# Patient Record
Sex: Male | Born: 1985 | Race: Black or African American | Hispanic: No | Marital: Single | Smoking: Never smoker
Health system: Southern US, Community
[De-identification: ages and names within clinical notes are randomized; demographics above are authoritative.]

## PROBLEM LIST (undated history)

## (undated) DIAGNOSIS — I011 Acute rheumatic endocarditis: Secondary | ICD-10-CM

## (undated) DIAGNOSIS — M069 Rheumatoid arthritis, unspecified: Secondary | ICD-10-CM

## (undated) DIAGNOSIS — G589 Mononeuropathy, unspecified: Secondary | ICD-10-CM

## (undated) DIAGNOSIS — K4090 Unilateral inguinal hernia, without obstruction or gangrene, not specified as recurrent: Secondary | ICD-10-CM

## (undated) DIAGNOSIS — M719 Bursopathy, unspecified: Secondary | ICD-10-CM

## (undated) DIAGNOSIS — S60419A Abrasion of unspecified finger, initial encounter: Secondary | ICD-10-CM

## (undated) DIAGNOSIS — K219 Gastro-esophageal reflux disease without esophagitis: Secondary | ICD-10-CM

## (undated) DIAGNOSIS — S62609A Fracture of unspecified phalanx of unspecified finger, initial encounter for closed fracture: Secondary | ICD-10-CM

## (undated) HISTORY — DX: Mononeuropathy, unspecified: G58.9

## (undated) HISTORY — PX: FINGER SURGERY: SHX640

## (undated) HISTORY — DX: Bursopathy, unspecified: M71.9

## (undated) HISTORY — DX: Acute rheumatic endocarditis: I01.1

## (undated) HISTORY — DX: Gastro-esophageal reflux disease without esophagitis: K21.9

---
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
## 2005-10-25 ENCOUNTER — Emergency Department (HOSPITAL_COMMUNITY): Admission: EM | Admit: 2005-10-25 | Discharge: 2005-10-25 | Payer: Self-pay | Admitting: Emergency Medicine

## 2006-08-28 ENCOUNTER — Emergency Department (HOSPITAL_COMMUNITY): Admission: EM | Admit: 2006-08-28 | Discharge: 2006-08-28 | Payer: Self-pay | Admitting: Emergency Medicine

## 2009-09-14 ENCOUNTER — Emergency Department (HOSPITAL_COMMUNITY): Admission: EM | Admit: 2009-09-14 | Discharge: 2009-09-14 | Payer: Self-pay | Admitting: Emergency Medicine

## 2011-10-19 ENCOUNTER — Other Ambulatory Visit: Payer: Self-pay | Admitting: *Deleted

## 2011-10-19 NOTE — Telephone Encounter (Signed)
error 

## 2011-12-26 DIAGNOSIS — S62609A Fracture of unspecified phalanx of unspecified finger, initial encounter for closed fracture: Secondary | ICD-10-CM

## 2011-12-26 HISTORY — DX: Fracture of unspecified phalanx of unspecified finger, initial encounter for closed fracture: S62.609A

## 2011-12-27 ENCOUNTER — Encounter (HOSPITAL_COMMUNITY): Payer: Self-pay | Admitting: Emergency Medicine

## 2011-12-27 ENCOUNTER — Emergency Department (HOSPITAL_COMMUNITY): Payer: Self-pay

## 2011-12-27 ENCOUNTER — Emergency Department (HOSPITAL_COMMUNITY)
Admission: EM | Admit: 2011-12-27 | Discharge: 2011-12-28 | Disposition: A | Discharge status: Home / Self Care | Payer: Self-pay | Attending: Emergency Medicine | Admitting: Emergency Medicine

## 2011-12-27 DIAGNOSIS — X500XXA Overexertion from strenuous movement or load, initial encounter: Secondary | ICD-10-CM | POA: Insufficient documentation

## 2011-12-27 DIAGNOSIS — S62609A Fracture of unspecified phalanx of unspecified finger, initial encounter for closed fracture: Secondary | ICD-10-CM | POA: Insufficient documentation

## 2011-12-27 NOTE — Discharge Instructions (Signed)
Finger Fracture A finger fracture is when one or more bones in the finger break.  HOME CARE   Wear the splint, tape, or cast as long as told by your doctor.   Keep your fingers in the position your doctor tell you to.   Raise (elevate) the injured area above the level of the heart.   Only take medicine as told by your doctor.   Put ice on the injured area.   Put ice in a plastic bag.   Place a towel between the skin and the bag.   Leave the ice on for 15 to 20 minutes, 3 to 4 times a day.   Follow up with your doctor.   Ask what exercises you can do when the splint comes off.  GET HELP RIGHT AWAY IF:   The fingernails are [place] or bluish.   You have pain not helped by medicine.   You cannot move your fingertips.   You lose feeling (numbness) in the injured finger(s).  MAKE SURE YOU:   Understand these instructions.   Will watch this condition.   Will get help right away if you are not doing well or get worse.  Document Released: 12/26/2007 Document Revised: 06/28/2011 Document Reviewed: 12/26/2007 [facility] Patient Information 2012 [place], [place]. Keep the splint in place for at least the next 10 days.  Followup with the hand surgeon in one to 2 weeks to assess healing

## 2011-12-27 NOTE — ED Notes (Signed)
PT. REPORTS RIGHT  FINGER INJURY YESTERDAY WHILE PLAYING WITH SON , PRESENTS WITH SWELLING/PAIN .

## 2011-12-27 NOTE — ED Notes (Signed)
Ortho paged. 

## 2011-12-27 NOTE — Progress Notes (Signed)
Orthopedic Tech Progress Note Patient Details:  Steve Flores 1986/07/04 [id number]  Ortho Devices Type of Ortho Device: Buddy tape;Finger splint Ortho Device/Splint Location: (R) UE Ortho Device/Splint Interventions: Application   [name] 12/27/2011, 11:28 PM

## 2011-12-27 NOTE — ED Provider Notes (Signed)
Medical screening examination/treatment/procedure(s) were performed by non-physician practitioner and as supervising physician I was immediately available for consultation/collaboration.   Lyanne Co, MD 12/27/11 [id number]

## 2011-12-27 NOTE — ED Provider Notes (Signed)
History     CSN: [id number]  Arrival date & time 12/27/11  2229   First MD Initiated Contact with Patient 12/27/11 2318      Chief Complaint  Patient presents with  . Finger Injury    (Consider location/radiation/quality/duration/timing/severity/associated sxs/prior treatment) HPI Comments: Agencies he was playing with his young son yesterday.  Rule out floor and inadvertently twisted his right ring finger, now swollen and tender with outpatient flexion or extension.  He's been taking over-the-counter ibuprofen with minimal relief.  Today at work.  He takes a to his other finger, and was comfortable  The history is provided by the patient.    History reviewed. No pertinent past medical history.  History reviewed. No pertinent past surgical history.  No family history on file.  History  Substance Use Topics  . Smoking status: Never Smoker   . Smokeless tobacco: Not on file  . Alcohol Use: Yes      Review of Systems  Constitutional: Negative for chills.  Musculoskeletal: Positive for joint swelling.  Skin: Negative for wound.  Neurological: Negative for dizziness, weakness and numbness.    Allergies  Review of patient's allergies indicates no known allergies.  Home Medications   Current Outpatient Rx  Name Route Sig Dispense Refill  . IBUPROFEN 200 MG PO TABS Oral Take 800 mg by mouth every 6 (six) hours as needed. For pain      BP 126/71  Pulse 81  Temp(Src) 98.3 F (36.8 C) (Oral)  Resp 18  SpO2 98%  Physical Exam  Constitutional: He appears well-developed and well-nourished.  HENT:  Head: Normocephalic.  Neck: Normal range of motion.  Cardiovascular: Normal rate.   Pulmonary/Chest: Effort normal.  Musculoskeletal:       Hands: Neurological: He is alert.  Skin: Skin is warm.    ED Course  Procedures (including critical care time)  Labs Reviewed - No data to display Dg Finger Ring Right  12/27/2011  *RADIOLOGY REPORT*  Clinical Data: Injured  finger.  RIGHT RING FINGER 2+V  Comparison: None  Findings: Oblique coursing fractures involving the proximal phalanx of the ring finger.  No definite involvement of the articular surface.  No other acute findings.  IMPRESSION: Nondisplaced fractures involving the proximal phalanx of the right ring finger.  Original Report Authenticated By: P. Loralie Champagne, M.D.     1. Finger fracture       MDM   Of the right ring finger, most prominent at the PIP joint held in slight flexion for comfort.  Due to swelling.  He can extend fully.  Flexion is limited due to the swelling Refill temperature and color of the finger is normal        Arman Filter, NP 12/27/11 2343  Arman Filter, NP 12/27/11 2343  Arman Filter, NP 12/27/11 [phone]

## 2011-12-28 ENCOUNTER — Other Ambulatory Visit: Payer: Self-pay | Admitting: Orthopedic Surgery

## 2011-12-28 ENCOUNTER — Encounter (HOSPITAL_BASED_OUTPATIENT_CLINIC_OR_DEPARTMENT_OTHER): Payer: Self-pay | Admitting: *Deleted

## 2011-12-28 DIAGNOSIS — S60419A Abrasion of unspecified finger, initial encounter: Secondary | ICD-10-CM

## 2011-12-28 HISTORY — DX: Abrasion of unspecified finger, initial encounter: S60.419A

## 2011-12-28 NOTE — ED Notes (Signed)
PT ambulated with a steady gait; VSS; A&Ox3; no signs of distress; respirations even and unlabored; skin warm and dry; no questions at this time.  

## 2011-12-31 ENCOUNTER — Ambulatory Visit (HOSPITAL_BASED_OUTPATIENT_CLINIC_OR_DEPARTMENT_OTHER): Payer: 59 | Admitting: Anesthesiology

## 2011-12-31 ENCOUNTER — Encounter (HOSPITAL_BASED_OUTPATIENT_CLINIC_OR_DEPARTMENT_OTHER): Payer: Self-pay | Admitting: Anesthesiology

## 2011-12-31 ENCOUNTER — Encounter (HOSPITAL_BASED_OUTPATIENT_CLINIC_OR_DEPARTMENT_OTHER)
Admission: RE | Disposition: A | Discharge status: Home / Self Care | Payer: Self-pay | Source: Ambulatory Visit | Attending: Orthopedic Surgery

## 2011-12-31 ENCOUNTER — Encounter (HOSPITAL_BASED_OUTPATIENT_CLINIC_OR_DEPARTMENT_OTHER): Payer: Self-pay | Admitting: *Deleted

## 2011-12-31 ENCOUNTER — Ambulatory Visit (HOSPITAL_BASED_OUTPATIENT_CLINIC_OR_DEPARTMENT_OTHER)
Admission: RE | Admit: 2011-12-31 | Discharge: 2011-12-31 | Disposition: A | Discharge status: Home / Self Care | Payer: 59 | Source: Ambulatory Visit | Attending: Orthopedic Surgery | Admitting: Orthopedic Surgery

## 2011-12-31 DIAGNOSIS — Y9383 Activity, rough housing and horseplay: Secondary | ICD-10-CM | POA: Insufficient documentation

## 2011-12-31 DIAGNOSIS — Y92009 Unspecified place in unspecified non-institutional (private) residence as the place of occurrence of the external cause: Secondary | ICD-10-CM | POA: Insufficient documentation

## 2011-12-31 DIAGNOSIS — IMO0002 Reserved for concepts with insufficient information to code with codable children: Secondary | ICD-10-CM | POA: Insufficient documentation

## 2011-12-31 DIAGNOSIS — Y998 Other external cause status: Secondary | ICD-10-CM | POA: Insufficient documentation

## 2011-12-31 DIAGNOSIS — X58XXXA Exposure to other specified factors, initial encounter: Secondary | ICD-10-CM | POA: Insufficient documentation

## 2011-12-31 HISTORY — DX: Abrasion of unspecified finger, initial encounter: S60.419A

## 2011-12-31 HISTORY — DX: Fracture of unspecified phalanx of unspecified finger, initial encounter for closed fracture: S62.609A

## 2011-12-31 SURGERY — OPEN REDUCTION INTERNAL FIXATION (ORIF) FINGER WITH RADIAL BONE GRAFT
Anesthesia: Choice | Site: Finger | Laterality: Right | Wound class: Clean

## 2011-12-31 MED ORDER — LACTATED RINGERS IV SOLN
INTRAVENOUS | Status: DC
  Administered 2011-12-31 (×3): via INTRAVENOUS

## 2011-12-31 MED ORDER — MIDAZOLAM HCL 2 MG/2ML IJ SOLN
0.5000 mg | INTRAMUSCULAR | Status: DC | PRN
  Administered 2011-12-31: 2 mg via INTRAVENOUS

## 2011-12-31 MED ORDER — PROPOFOL 10 MG/ML IV EMUL
INTRAVENOUS | Status: DC | PRN
  Administered 2011-12-31: 300 mg via INTRAVENOUS

## 2011-12-31 MED ORDER — GLYCOPYRROLATE 0.2 MG/ML IJ SOLN
INTRAMUSCULAR | Status: DC | PRN
  Administered 2011-12-31: 0.2 mg via INTRAVENOUS

## 2011-12-31 MED ORDER — DEXAMETHASONE SODIUM PHOSPHATE 4 MG/ML IJ SOLN
INTRAMUSCULAR | Status: DC | PRN
  Administered 2011-12-31: 10 mg via INTRAVENOUS

## 2011-12-31 MED ORDER — ROPIVACAINE HCL 5 MG/ML IJ SOLN
INTRAMUSCULAR | Status: DC | PRN
  Administered 2011-12-31: 25 mL via EPIDURAL

## 2011-12-31 MED ORDER — FENTANYL CITRATE 0.05 MG/ML IJ SOLN
50.0000 ug | INTRAMUSCULAR | Status: DC | PRN
  Administered 2011-12-31: 100 ug via INTRAVENOUS

## 2011-12-31 MED ORDER — HYDROCODONE-ACETAMINOPHEN 5-325 MG PO TABS: ORAL_TABLET | ORAL | Status: DC

## 2011-12-31 MED ORDER — CEFAZOLIN SODIUM 1-5 GM-% IV SOLN
INTRAVENOUS | Status: DC | PRN
  Administered 2011-12-31: 1 g via INTRAVENOUS

## 2011-12-31 MED ORDER — ONDANSETRON HCL 4 MG/2ML IJ SOLN
INTRAMUSCULAR | Status: DC | PRN
  Administered 2011-12-31: 4 mg via INTRAVENOUS

## 2011-12-31 MED ORDER — LIDOCAINE HCL 1 % IJ SOLN
INTRAMUSCULAR | Status: DC | PRN
  Administered 2011-12-31: 2 mL via INTRADERMAL

## 2011-12-31 SURGICAL SUPPLY — 60 items
BANDAGE ELASTIC 3 VELCRO ST LF (GAUZE/BANDAGES/DRESSINGS) IMPLANT
BANDAGE GAUZE ELAST BULKY 4 IN (GAUZE/BANDAGES/DRESSINGS) ×1 IMPLANT
BLADE MINI RND TIP GREEN BEAV (BLADE) IMPLANT
BLADE SURG 15 STRL LF DISP TIS (BLADE) ×2 IMPLANT
BLADE SURG 15 STRL SS (BLADE) ×4
BNDG CMPR 9X4 STRL LF SNTH (GAUZE/BANDAGES/DRESSINGS)
BNDG CMPR MD 5X2 ELC HKLP STRL (GAUZE/BANDAGES/DRESSINGS)
BNDG ELASTIC 2 VLCR STRL LF (GAUZE/BANDAGES/DRESSINGS) IMPLANT
BNDG ESMARK 4X9 LF (GAUZE/BANDAGES/DRESSINGS) IMPLANT
CHLORAPREP W/TINT 26ML (MISCELLANEOUS) ×2 IMPLANT
CLOTH BEACON ORANGE TIMEOUT ST (SAFETY) ×2 IMPLANT
CORDS BIPOLAR (ELECTRODE) ×2 IMPLANT
COVER MAYO STAND STRL (DRAPES) ×2 IMPLANT
COVER TABLE BACK 60X90 (DRAPES) ×2 IMPLANT
CUFF TOURNIQUET SINGLE 18IN (TOURNIQUET CUFF) ×2 IMPLANT
DRAPE EXTREMITY T 121X128X90 (DRAPE) ×2 IMPLANT
DRAPE OEC MINIVIEW 54X84 (DRAPES) ×1 IMPLANT
DRAPE SURG 17X23 STRL (DRAPES) ×2 IMPLANT
GAUZE XEROFORM 1X8 LF (GAUZE/BANDAGES/DRESSINGS) ×2 IMPLANT
GLOVE BIO SURGEON STRL SZ7.5 (GLOVE) ×2 IMPLANT
GLOVE BIOGEL PI IND STRL 8 (GLOVE) ×1 IMPLANT
GLOVE BIOGEL PI IND STRL 8.5 (GLOVE) IMPLANT
GLOVE BIOGEL PI INDICATOR 8 (GLOVE) ×1
GLOVE BIOGEL PI INDICATOR 8.5 (GLOVE)
GLOVE SKINSENSE NS SZ7.0 (GLOVE) ×1
GLOVE SKINSENSE STRL SZ7.0 (GLOVE) IMPLANT
GLOVE SURG ORTHO 8.0 STRL STRW (GLOVE) IMPLANT
GOWN PREVENTION PLUS XLARGE (GOWN DISPOSABLE) ×2 IMPLANT
GOWN PREVENTION PLUS XXLARGE (GOWN DISPOSABLE) ×2 IMPLANT
GOWN STRL REIN XL XLG (GOWN DISPOSABLE) ×1 IMPLANT
NDL HYPO 25X1 1.5 SAFETY (NEEDLE) IMPLANT
NEEDLE HYPO 22GX1.5 SAFETY (NEEDLE) IMPLANT
NEEDLE HYPO 25X1 1.5 SAFETY (NEEDLE) IMPLANT
NS IRRIG 1000ML POUR BTL (IV SOLUTION) ×2 IMPLANT
PACK BASIN DAY SURGERY FS (CUSTOM PROCEDURE TRAY) ×2 IMPLANT
PAD CAST 3X4 CTTN HI CHSV (CAST SUPPLIES) IMPLANT
PAD CAST 4YDX4 CTTN HI CHSV (CAST SUPPLIES) IMPLANT
PADDING CAST ABS 4INX4YD NS (CAST SUPPLIES) ×1
PADDING CAST ABS COTTON 4X4 ST (CAST SUPPLIES) ×1 IMPLANT
PADDING CAST COTTON 3X4 STRL (CAST SUPPLIES)
PADDING CAST COTTON 4X4 STRL (CAST SUPPLIES)
SCREW SELF TAP CORTEX 1.0 10MM (Screw) ×2 IMPLANT
SCREW SELF TAP CORTEX 1.0 11MM (Screw) ×1 IMPLANT
SCREW SELF TAP CORTEX 1.0 12MM (Screw) ×1 IMPLANT
SLEEVE SCD COMPRESS KNEE MED (MISCELLANEOUS) IMPLANT
SPLINT PLASTER CAST XFAST 4X15 (CAST SUPPLIES) IMPLANT
SPLINT PLASTER XTRA FAST SET 4 (CAST SUPPLIES)
SPONGE GAUZE 4X4 12PLY (GAUZE/BANDAGES/DRESSINGS) ×2 IMPLANT
STOCKINETTE 4X48 STRL (DRAPES) ×2 IMPLANT
SUT ETHILON 3 0 PS 1 (SUTURE) IMPLANT
SUT ETHILON 4 0 PS 2 18 (SUTURE) ×2 IMPLANT
SUT MERSILENE 4 0 P 3 (SUTURE) IMPLANT
SUT VIC AB 3-0 PS1 18 (SUTURE)
SUT VIC AB 3-0 PS1 18XBRD (SUTURE) IMPLANT
SUT VICRYL 4-0 PS2 18IN ABS (SUTURE) IMPLANT
SYR BULB 3OZ (MISCELLANEOUS) ×2 IMPLANT
SYR CONTROL 10ML LL (SYRINGE) IMPLANT
TOWEL OR 17X24 6PK STRL BLUE (TOWEL DISPOSABLE) ×4 IMPLANT
UNDERPAD 30X30 INCONTINENT (UNDERPADS AND DIAPERS) ×2 IMPLANT
WATER STERILE IRR 1000ML POUR (IV SOLUTION) ×1 IMPLANT

## 2011-12-31 NOTE — Discharge Instructions (Addendum)
[facility] Center Instructions Hand Surgery  Wound Care: Keep your hand elevated above the level of your heart.  Do not allow it to dangle by your side.  Keep the dressing dry and do not remove it unless your doctor advises you to do so.  He will usually change it at the time of your post-op visit.  Moving your fingers is advised to stimulate circulation but will depend on the site of your surgery.  If you have a splint applied, your doctor will advise you regarding movement.  Activity: Do not drive or operate machinery today.  Rest today and then you may return to your normal activity and work as indicated by your physician.  Diet:  Drink liquids today or eat a light diet.  You may resume a regular diet tomorrow.    General expectations: Pain for two to three days. Fingers may become slightly swollen.  Call your doctor if any of the following occur: Severe pain not relieved by pain medication. Elevated temperature. Dressing soaked with blood. Inability to move fingers. [place] or bluish color to fingers.  Regional Anesthesia Blocks  1. Numbness or the inability to move the "blocked" extremity may last from 3-48 hours after placement. The length of time depends on the medication injected and your individual response to the medication. If the numbness is not going away after 48 hours, call your surgeon.  2. The extremity that is blocked will need to be protected until the numbness is gone and the  Strength has returned. Because you cannot feel it, you will need to take extra care to avoid injury. Because it may be weak, you may have difficulty moving it or using it. You may not know what position it is in without looking at it while the block is in effect.  3. For blocks in the legs and feet, returning to weight bearing and walking needs to be done carefully. You will need to wait until the numbness is entirely gone and the strength has returned. You should be able to move your leg and foot  normally before you try and bear weight or walk. You will need someone to be with you when you first try to ensure you do not fall and possibly risk injury.  4. Bruising and tenderness at the needle site are common side effects and will resolve in a few days.  5. Persistent numbness or new problems with movement should be communicated to the surgeon or the [facility] [phone] [facility] [phone]).         HAND SURGERY    HOME CARE INSTRUCTIONS    The following instructions have been prepared to help you care for yourself upon your return home today.  Wound Care:  Keep your hand elevated above the level of your heart. Do not allow it to dangle by your side. Keep the dressing dry and do not remove it unless your doctor advises you to do so. He will usually change it at the time of you post-op visit. Moving your fingers is advised to stimulate circulation but will depend on the site of your surgery. Of course, if you have a splint applied your doctor will advise you about movement.  Activity:  Do not drive or operate machinery today. Rest today and then you may return to your normal activity and work as indicated by your physician.  Diet: Drink liquids today or eat a light diet. You may resume a regular diet tomorrow.  General expectations: Pain for two or three days. Fingers may become slightly swollen.   Unexpected Observations- Call your doctor if any of these occur: Severe pain not relieved by pain medication. Elevated temperature. Dressing soaked with blood. Inability to move fingers. [place] or bluish color to fingers.  Post Anesthesia Home Care Instructions  Activity: Get plenty of rest for the remainder of the day. A responsible adult should stay with you for 24 hours following the procedure.  For the next 24 hours, DO NOT: -Drive a car -Advertising copywriter -Drink alcoholic beverages -Take any medication unless instructed by your  physician -Make any legal decisions or sign important papers.  Meals: Start with liquid foods such as gelatin or soup. Progress to regular foods as tolerated. Avoid greasy, spicy, heavy foods. If nausea and/or vomiting occur, drink only clear liquids until the nausea and/or vomiting subsides. Call your physician if vomiting continues.  Special Instructions/Symptoms: Your throat may feel dry or sore from the anesthesia or the breathing tube placed in your throat during surgery. If this causes discomfort, gargle with warm salt water. The discomfort should disappear within 24 hours.

## 2011-12-31 NOTE — Anesthesia Postprocedure Evaluation (Signed)
Anesthesia Post Note  Patient: Steve Flores  Procedure(s) Performed: Procedure(s) (LRB): OPEN REDUCTION INTERNAL FIXATION (ORIF) FINGER WITH RADIAL BONE GRAFT (Right)  Anesthesia type: General  Patient location: PACU  Post pain: Pain level controlled  Post assessment: Patient's Cardiovascular Status Stable  Last Vitals:  Filed Vitals:   12/31/11 1250  BP: 148/97  Pulse: 50  Temp: 36.6 C  Resp: 16    Post vital signs: Reviewed and stable  Level of consciousness: alert  Complications: No apparent anesthesia complications

## 2011-12-31 NOTE — Progress Notes (Signed)
Assisted Dr. Frederick with right, ultrasound guided, supraclavicular block. Side rails up, monitors on throughout procedure. See vital signs in flow sheet. Tolerated Procedure well. 

## 2011-12-31 NOTE — Op Note (Signed)
Dictation [id number]

## 2011-12-31 NOTE — Anesthesia Procedure Notes (Addendum)
Anesthesia Regional Block:  Supraclavicular block  Pre-Anesthetic Checklist: ,, timeout performed, Correct Patient, Correct Site, Correct Laterality, Correct Procedure, Correct Position, site marked, Risks and benefits discussed,  Surgical consent,  Pre-op evaluation,  At surgeon's request and post-op pain management  Laterality: Right  Prep: chloraprep       Needles:   Needle Type: Other   (Arrow Echogenic)   Needle Length: 9cm  Needle Gauge: 21    Additional Needles:  Procedures: ultrasound guided Supraclavicular block Narrative:  Start time: 12/31/2011 8:33 AM End time: 12/31/2011 8:40 AM Injection made incrementally with aspirations every 5 mL.  Performed by: Personally  Anesthesiologist: C Frederick  Additional Notes: Ultrasound guidance used to: id relevant anatomy, confirm needle position, local anesthetic spread, avoidance of vascular puncture. Picture saved. No complications. Block performed personally by Janetta Hora. Gelene Mink, MD    Supraclavicular block Procedure Name: LMA Insertion Date/Time: 12/31/2011 10:05 AM Performed by: Gar Gibbon Pre-anesthesia Checklist: Patient identified, Emergency Drugs available, Suction available and Patient being monitored Patient Re-evaluated:Patient Re-evaluated prior to inductionOxygen Delivery Method: Circle System Utilized Preoxygenation: Pre-oxygenation with 100% oxygen Intubation Type: IV induction Ventilation: Mask ventilation without difficulty LMA: LMA inserted LMA Size: 4.0 Number of attempts: 1 Airway Equipment and Method: bite block Placement Confirmation: positive ETCO2 Tube secured with: Tape Dental Injury: Teeth and Oropharynx as per pre-operative assessment

## 2011-12-31 NOTE — Anesthesia Preprocedure Evaluation (Signed)
Anesthesia Evaluation  Patient identified by MRN, date of birth, ID band Patient awake    Reviewed: Allergy & Precautions, H&P , NPO status , Patient's Chart, lab work & pertinent test results, reviewed documented beta blocker date and time   Airway Mallampati: II TM Distance: >3 FB Neck ROM: full    Dental   Pulmonary neg pulmonary ROS,          Cardiovascular negative cardio ROS      Neuro/Psych negative neurological ROS  negative psych ROS   GI/Hepatic negative GI ROS, Neg liver ROS,   Endo/Other  negative endocrine ROS  Renal/GU negative Renal ROS  negative genitourinary   Musculoskeletal   Abdominal   Peds  Hematology negative hematology ROS (+)   Anesthesia Other Findings See surgeon's H&P   Reproductive/Obstetrics negative OB ROS                           Anesthesia Physical Anesthesia Plan  ASA: I  Anesthesia Plan: General   Post-op Pain Management:    Induction: Intravenous  Airway Management Planned: LMA  Additional Equipment:   Intra-op Plan:   Post-operative Plan: Extubation in OR  Informed Consent: I have reviewed the patients History and Physical, chart, labs and discussed the procedure including the risks, benefits and alternatives for the proposed anesthesia with the patient or authorized representative who has indicated his/her understanding and acceptance.   Dental Advisory Given  Plan Discussed with: CRNA and Surgeon  Anesthesia Plan Comments:         Anesthesia Quick Evaluation  

## 2011-12-31 NOTE — Transfer of Care (Signed)
Immediate Anesthesia Transfer of Care Note  Patient: Steve Flores  Procedure(s) Performed: Procedure(s) (LRB): OPEN REDUCTION INTERNAL FIXATION (ORIF) FINGER WITH RADIAL BONE GRAFT (Right)  Patient Location: PACU  Anesthesia Type: General and Regional  Level of Consciousness: awake, alert  and oriented  Airway & Oxygen Therapy: Patient Spontanous Breathing and Patient connected to face mask oxygen  Post-op Assessment: Report given to PACU RN and Post -op Vital signs reviewed and stable  Post vital signs: Reviewed and stable  Complications: No apparent anesthesia complications

## 2011-12-31 NOTE — Op Note (Signed)
Steve Flores, Steve Flores                ACCOUNT NO.:  [id number]  MEDICAL RECORD NO.:  [id number]  LOCATION:                                 FACILITY:  PHYSICIAN:  Betha Loa, MD        DATE OF BIRTH:  [birth year]  DATE OF PROCEDURE:  12/31/2011 DATE OF DISCHARGE:                              OPERATIVE REPORT   PREOPERATIVE DIAGNOSIS:  Right ring finger proximal phalanx fracture.  POSTOPERATIVE DIAGNOSIS:  Right ring finger proximal phalanx fracture.  PROCEDURE:  Open reduction and internal fixation of the right ring finger proximal phalanx fracture.  SURGEON:  Betha Loa, M.D.  ASSISTANT:  None.  ANESTHESIA:  General with regional.  IV FLUIDS:  Per anesthesia flow sheet.  ESTIMATED BLOOD LOSS:  Minimal.  COMPLICATIONS:  None.  SPECIMENS:  None.  TOURNIQUET TIME:  58 minutes.  DISPOSITION:  Stable to PACU.  INDICATIONS:  Mr. Klem is a 26 year old right-hand dominant male, who was wrestling with his son last week on the floor when he injured his right ring finger.  He presented to the emergency department the following day where radiographs were taken revealing a fracture of his right ring finger proximal phalanx.  He was placed in a splint and referred to me for care.  On evaluation, he had intact sensation and capillary refill.  The finger was started to deviate away from the long finger.  I discussed with Mr. Nygard the nature of his injury.  We discussed nonoperative and operative treatment options, and he elected to undergo operative fixation.  Risks, benefits, and alternatives of surgery were discussed including the risk of blood loss; infection; damage to nerves, vessels, tendons, ligaments, bone; failure of surgery; need for additional surgery; complications with wound healing; continued pain; nonunion; malunion; stiffness.  He voiced understanding of these risks and elected to proceed.  OPERATIVE COURSE:  After being identified preoperatively by myself,  the patient and I agreed upon the procedure and site of procedure.  The surgical site was marked.  The risks, benefits, and alternatives of surgery were reviewed and he wished to proceed.  A surgical consent had been signed.  He was given 1 g of IV Ancef as preoperative antibiotic prophylaxis.  A regional block was performed by Anesthesia in preoperative holding.  He was transferred to the operating room and placed on the operating room table in supine position with the right upper extremity on arm board.  General anesthesia was induced by the anesthesiologist.  The right upper extremity was prepped and draped in normal sterile orthopedic fashion.  A surgical pause was performed between surgeons, anesthesia, operating staff, and all were in agreement as to the patient, procedure and site of the procedure.  Tourniquet proximal aspect of the extremity was inflated 250 mmHg after exsanguination of the limb with an Esmarch bandage.  A dorsal incision was made over the ring finger proximal phalanx.  This was carried into subcutaneous tissues by spreading technique.  Bipolar electrocautery was used to obtain hemostasis.  The tendon was split longitudinally and the periosteum was split longitudinally as well.  It was elevated using the Therapist, nutritional.  The fracture site was easily  identified.  It was cleared of hematoma.  It was reduced under direct visualization. Anatomic reduction was obtained.  It was held in position using phalangeal clamp.  The C-arm was used in AP and lateral projections to ensure appropriate reduction, which was the case.  The modular handset was used.  The 1.3 mm screws were used.  AO drilling and measuring technique was used and all screws were countersunk.  Four screws were placed across the fracture site.  All screws had good purchase.  The C- arm was used in AP, lateral, oblique projections to ensure appropriate reduction and position of hardware, which was the case.   The screws were not prominent.  The wound was copiously irrigated with sterile saline. Periosteum was repaired using 4-0 Vicryl suture.  The tendon was repaired in a running fashion using 4-0 Mersilene.  Skin was repaired with 4-0 nylon in an horizontal mattress fashion.  The wound was dressed with sterile Xeroform, 4x4s, and wrapped with a Kerlix bandage.  A volar and dorsal slab splint including the long, ring, and small fingers was placed with the MPs flexed and the IPs extended.  This was wrapped with Kerlix and Ace bandage.  The tourniquet was deflated at 58 minutes.  The fingertips were pink with brisk capillary refill after deflation of the tourniquet.  Operative drapes were broken down and the patient was awoken from anesthesia safely.  He was transferred back to stretcher and taken to PACU in stable condition.  I will see him back in the office in 1 week for postoperative followup.  I will give him Norco 5/325, 1-2 p.o. q.6 hours p.r.n. pain, dispensed #40.     Betha Loa, MD     KK/MEDQ  D:  12/31/2011  T:  12/31/2011  Job:  [id number]

## 2011-12-31 NOTE — H&P (Signed)
  Steve Flores is an 26 y.o. male.   Chief Complaint: right ring finger fracture HPI: 26 yo rhd male injured right ring finger while wrestling with son on 12/26/11.  Se[facility] at [facility] 12/27/11 where XR revealed right ring finger proximal phalanx fracture.  Reports no previous injuries to ring finger and no other injuries at this time.    Past Medical History  Diagnosis Date  . Fracture of phalanx of finger 12/26/2011    right ring prox. phalanx  . Abrasion of finger of right hand 12/28/2011    healing wound right index finger    Past Surgical History  Procedure Date  . No past surgeries     No family history on file. Social History:  reports that he has never smoked. He has never used smokeless tobacco. He reports that he drinks alcohol. He reports that he does not use illicit drugs.  Allergies: No Known Allergies  Medications Prior to Admission  Medication Sig Dispense Refill  . ibuprofen (ADVIL,MOTRIN) 200 MG tablet Take 800 mg by mouth every 6 (six) hours as needed. For pain        Results for orders placed during the hospital encounter of 12/31/11 (from the past 48 hour(s))  POCT HEMOGLOBIN-HEMACUE     Status: Normal   Collection Time   12/31/11  8:21 AM      Component Value Range Comment   Hemoglobin 15.6  13.0 - 17.0 (g/dL)     No results found.   A comprehensive review of systems was negative.  Blood pressure 117/69, pulse 56, temperature 98.7 F (37.1 C), temperature source Oral, resp. rate 18, height 5\' 10"  (1.778 m), weight 72.576 kg (160 lb), SpO2 100.00%.  General appearance: alert, cooperative and appears stated age Head: Normocephalic, without obvious abnormality, atraumatic Neck: supple, symmetrical, trachea midline Resp: clear to auscultation bilaterally Cardio: regular rate and rhythm GI: soft, non-tender; bowel sounds normal; no masses,  no organomegaly Extremities: light touch sensation and capillary refill intact all digits.  +epl/fpl/io.  right ring ttp  proximal phalanx.  finger deviated away from long slightly with flexion.  skin intact. Pulses: 2+ and symmetric Skin: Skin color, texture, turgor normal. No rashes or lesions Neurologic: Grossly normal Incision/Wound: na  Assessment/Plan Right ring finger proximal phalanx fracture.  Discussed non operative and operative treatment options with patient.  He wishes to have operative fixation.  Risks, benefits, and alternatives of surgery were discussed and the patient agrees with the plan of care.   Steve Flores R 12/31/2011, 8:51 AM

## 2013-04-21 ENCOUNTER — Emergency Department (HOSPITAL_COMMUNITY): Payer: Self-pay

## 2013-04-21 ENCOUNTER — Encounter (HOSPITAL_COMMUNITY): Payer: Self-pay | Admitting: *Deleted

## 2013-04-21 ENCOUNTER — Emergency Department (HOSPITAL_COMMUNITY): Payer: 59

## 2013-04-21 ENCOUNTER — Emergency Department (HOSPITAL_COMMUNITY)
Admission: EM | Admit: 2013-04-21 | Discharge: 2013-04-21 | Disposition: A | Discharge status: Home / Self Care | Payer: 59 | Attending: Emergency Medicine | Admitting: Emergency Medicine

## 2013-04-21 DIAGNOSIS — S3981XA Other specified injuries of abdomen, initial encounter: Secondary | ICD-10-CM | POA: Insufficient documentation

## 2013-04-21 DIAGNOSIS — IMO0002 Reserved for concepts with insufficient information to code with codable children: Secondary | ICD-10-CM | POA: Insufficient documentation

## 2013-04-21 DIAGNOSIS — R296 Repeated falls: Secondary | ICD-10-CM | POA: Insufficient documentation

## 2013-04-21 DIAGNOSIS — M791 Myalgia, unspecified site: Secondary | ICD-10-CM

## 2013-04-21 DIAGNOSIS — Z8781 Personal history of (healed) traumatic fracture: Secondary | ICD-10-CM | POA: Insufficient documentation

## 2013-04-21 DIAGNOSIS — M549 Dorsalgia, unspecified: Secondary | ICD-10-CM

## 2013-04-21 DIAGNOSIS — Y9361 Activity, american tackle football: Secondary | ICD-10-CM | POA: Insufficient documentation

## 2013-04-21 DIAGNOSIS — Y9239 Other specified sports and athletic area as the place of occurrence of the external cause: Secondary | ICD-10-CM | POA: Insufficient documentation

## 2013-04-21 LAB — URINALYSIS, ROUTINE W REFLEX MICROSCOPIC
Bilirubin Urine: NEGATIVE
Hgb urine dipstick: NEGATIVE
Specific Gravity, Urine: 1.008 (ref 1.005–1.030)
pH: 7 (ref 5.0–8.0)

## 2013-04-21 MED ORDER — DIAZEPAM 5 MG PO TABS
5.0000 mg | ORAL_TABLET | Freq: Once | ORAL | Status: DC
  Filled 2013-04-21: qty 1

## 2013-04-21 MED ORDER — NAPROXEN 500 MG PO TABS
500.0000 mg | ORAL_TABLET | Freq: Two times a day (BID) | ORAL | Status: DC
Start: 2013-04-21 — End: 2013-06-09

## 2013-04-21 MED ORDER — KETOROLAC TROMETHAMINE 60 MG/2ML IM SOLN
60.0000 mg | Freq: Once | INTRAMUSCULAR | Status: AC
  Administered 2013-04-21: 60 mg via INTRAMUSCULAR
  Filled 2013-04-21: qty 2

## 2013-04-21 MED ORDER — CYCLOBENZAPRINE HCL 10 MG PO TABS: 10.0000 mg | ORAL_TABLET | Freq: Two times a day (BID) | ORAL | Status: DC | PRN

## 2013-04-21 NOTE — ED Notes (Signed)
C/o left flank pain since playing football last week. Pain on and off, worse with movement, cough, etc.

## 2013-04-21 NOTE — ED Provider Notes (Signed)
CSN: [id number]     Arrival date & time 04/21/13  1001 History   First MD Initiated Contact with Patient 04/21/13 1027     Chief Complaint  Patient presents with  . Back Pain   (Consider location/radiation/quality/duration/timing/severity/associated sxs/prior Treatment) The history is provided by the patient. No language interpreter was used.  Steve Flores is a 27 year old male presenting to emergency department with back pain, left-sided flank pain that has been ongoing since last week. Patient reports he was playing football last week and landed on his left side, taking a fall. Patient reports he has discomfort in the thoracic lumbar region and localized to the left side described as a soreness, dull aching sensation that is constant. Patient reports that the pain worsens with sneezing, deep inhalation, and running with nothing making the pain better. Patient reported that on  Saturday he took 4 ibuprofen with minimal relief. Patient reports he has no radiation of pain. Denied numbness, weakness, tingling, chills, dysuria, hematuria, nausea, vomiting, previous back injury, history kidney stones or kidney infections. PCP none  Past Medical History  Diagnosis Date  . Fracture of phalanx of finger 12/26/2011    right ring prox. phalanx  . Abrasion of finger of right hand 12/28/2011    healing wound right index finger   Past Surgical History  Procedure Laterality Date  . No past surgeries     History reviewed. No pertinent family history. History  Substance Use Topics  . Smoking status: Never Smoker   . Smokeless tobacco: Never Used  . Alcohol Use: Yes     Comment: occasionally    Review of Systems  Constitutional: Negative for fever and chills.  HENT: Negative for neck pain and neck stiffness.   Eyes: Negative for visual disturbance.  Respiratory: Negative for chest tightness and shortness of breath.   Cardiovascular: Negative for chest pain.  Genitourinary: Positive for flank pain  (Left-sided). Negative for dysuria, hematuria and decreased urine volume.  Musculoskeletal: Positive for back pain.  Neurological: Negative for weakness and numbness.  All other systems reviewed and are negative.    Allergies  Review of patient's allergies indicates no known allergies.  Home Medications   Current Outpatient Rx  Name  Route  Sig  Dispense  Refill  . cyclobenzaprine (FLEXERIL) 10 MG tablet   Oral   Take 1 tablet (10 mg total) by mouth 2 (two) times daily as needed for muscle spasms.   20 tablet   0   . ibuprofen (ADVIL,MOTRIN) 200 MG tablet   Oral   Take 800 mg by mouth daily as needed for pain. For pain         . naproxen (NAPROSYN) 500 MG tablet   Oral   Take 1 tablet (500 mg total) by mouth 2 (two) times daily.   30 tablet   0    BP 120/58  Pulse 67  Temp(Src) 98.1 F (36.7 C) (Oral)  Resp 18  SpO2 97% Physical Exam  Nursing note and vitals reviewed. Constitutional: He is oriented to person, place, and time. He appears well-developed and well-nourished. No distress.  HENT:  Head: Normocephalic and atraumatic.  Eyes: Conjunctivae and EOM are normal. Pupils are equal, round, and reactive to light. Right eye exhibits no discharge. Left eye exhibits no discharge.  Neck: Normal range of motion. Neck supple.  Cardiovascular: Normal rate and regular rhythm.  Exam reveals no friction rub.   No murmur heard. Pulses:      Radial pulses are  2+ on the right side, and 2+ on the left side.       Dorsalis pedis pulses are 2+ on the right side, and 2+ on the left side.  Pulmonary/Chest: Effort normal and breath sounds normal. No respiratory distress. He has no wheezes. He has no rales.  Abdominal:  Positive left-sided CVA tenderness  Musculoskeletal: Normal range of motion. He exhibits tenderness.       Back:  Full range of motion to upper and lower extremities bilaterally  Negative swelling, erythema, inflammation, ecchymosis noted to the back. Discomfort  upon palpation to the left aspect of the thoracic region-muscular in nature. Full flexion identified with negative discomfort.  Neurological: He is alert and oriented to person, place, and time. He exhibits normal muscle tone. Coordination normal.  Strength 5+/5+ to upper and lower extremities bilaterally with resistance, equal distribution identified  Skin: Skin is warm and dry. No rash noted. He is not diaphoretic. No erythema.  Psychiatric: He has a normal mood and affect. His behavior is normal. Thought content normal.    ED Course  Procedures (including critical care time) Labs Review Labs Reviewed  URINALYSIS, ROUTINE W REFLEX MICROSCOPIC   Imaging Review Dg Thoracic Spine 4v  04/21/2013   CLINICAL DATA:  Back pain secondary to a fall 1 week ago.  EXAM: THORACIC SPINE - 4+ VIEW  COMPARISON:  None.  FINDINGS: There is no evidence of thoracic spine fracture. Alignment is normal. No other significant bone abnormalities are identified.  IMPRESSION: Negative.   Electronically Signed   By: Geanie Cooley   On: 04/21/2013 12:40   Dg Lumbar Spine Complete  04/21/2013   CLINICAL DATA:  Mid and low back pain secondary to fall 1 week ago.  EXAM: LUMBAR SPINE - COMPLETE 4+ VIEW  COMPARISON:  None.  FINDINGS: There is no evidence of lumbar spine fracture. Alignment is normal. Intervertebral disc spaces are maintained.  IMPRESSION: Negative.   Electronically Signed   By: Geanie Cooley   On: 04/21/2013 12:39   US Renal  04/21/2013   CLINICAL DATA:  Back pain  EXAM: RENAL/URINARY TRACT ULTRASOUND COMPLETE  COMPARISON:  None.  FINDINGS: Right Kidney  Length: 10.0 cm. Echogenicity within normal limits. No mass or hydronephrosis visualized.  Left Kidney  Length: 10.1 cm. Echogenicity within normal limits. No mass or hydronephrosis visualized.  Bladder:  Within normal limits.  IMPRESSION: Negative renal ultrasound.   Electronically Signed   By: Charline Bills M.D.   On: 04/21/2013 13:30    MDM   1. Back  pain   2. Muscular pain    Patient presenting to emergency department with left-sided back/flank pain has been ongoing since last week. Patient reports he was playing football he landed on his left side. Reports he's been using ibuprofen with minimal relief. Alert and oriented. Negative pain upon palpation to the mid spinal region. Discomfort upon the left aspect of the thoracic region - muscular in nature. Positive CVA tenderness identified to the left side. Full range of motion to upper and lower extremities bilaterally. Strength intact and equal distribution identified. Negative neurological deficits identified. Pulses strong and palpable, distal and proximal. Negative deformities identified to the back. Urine negative for infection, hemoglobin, leukocytes. Renal ultrasound negative findings. Thoracic spine and lumbar spine plain films negative acute findings identified. Doubt pyelonephritis. Doubt kidney stone. Suspicion to be muscular in nature. Patient stable, afebrile. Discharged patient with anti-inflammatories and muscle relaxer. [facility] and [facility]ics. Discussed  with patient to rest and stay hydrated. Discussed with patient to apply heat. Discussed with patient to use icy hot ointment and massage to aid in muscular relief. Discussed with patient to rest and stay hydrated. Discussed with patient to continue to monitor symptoms and if symptoms are to worsen or change report back to emergency department-strict return instructions given. Patient agreed to plan of care, understood, all questions answered.    Raymon Mutton, PA-C 04/21/13 1807

## 2013-04-21 NOTE — ED Notes (Signed)
Reports playing sports last week and took a fall. Having mid left side back pain since, pain increases with movement, denies any urinary symptoms. Ambulatory at triage.

## 2013-04-21 NOTE — ED Notes (Signed)
Pt states he is driving. Valium order d/c'd.

## 2013-04-22 NOTE — ED Provider Notes (Signed)
Medical screening examination/treatment/procedure(s) were performed by non-physician practitioner and as supervising physician I was immediately available for consultation/collaboration.  Makila Colombe, MD 04/22/13 1143 

## 2013-06-09 ENCOUNTER — Emergency Department (HOSPITAL_COMMUNITY): Payer: Self-pay

## 2013-06-09 ENCOUNTER — Encounter (HOSPITAL_COMMUNITY): Payer: Self-pay | Admitting: Emergency Medicine

## 2013-06-09 ENCOUNTER — Emergency Department (HOSPITAL_COMMUNITY)
Admission: EM | Admit: 2013-06-09 | Discharge: 2013-06-09 | Discharge status: Against Medical Advice | Payer: Self-pay | Attending: Emergency Medicine | Admitting: Emergency Medicine

## 2013-06-09 DIAGNOSIS — R519 Headache, unspecified: Secondary | ICD-10-CM

## 2013-06-09 DIAGNOSIS — J3489 Other specified disorders of nose and nasal sinuses: Secondary | ICD-10-CM | POA: Insufficient documentation

## 2013-06-09 DIAGNOSIS — R51 Headache: Secondary | ICD-10-CM | POA: Insufficient documentation

## 2013-06-09 DIAGNOSIS — Z8781 Personal history of (healed) traumatic fracture: Secondary | ICD-10-CM | POA: Insufficient documentation

## 2013-06-09 DIAGNOSIS — Z113 Encounter for screening for infections with a predominantly sexual mode of transmission: Secondary | ICD-10-CM | POA: Insufficient documentation

## 2013-06-09 DIAGNOSIS — Z87828 Personal history of other (healed) physical injury and trauma: Secondary | ICD-10-CM | POA: Insufficient documentation

## 2013-06-09 LAB — CBC WITH DIFFERENTIAL/PLATELET
Basophils Absolute: 0 10*3/uL (ref 0.0–0.1)
HCT: 38.2 % — ABNORMAL LOW (ref 39.0–52.0)
Hemoglobin: 13.8 g/dL (ref 13.0–17.0)
Lymphocytes Relative: 30 % (ref 12–46)
Monocytes Absolute: 0.5 10*3/uL (ref 0.1–1.0)
Monocytes Relative: 9 % (ref 3–12)
Neutro Abs: 2.8 10*3/uL (ref 1.7–7.7)
RBC: 4.87 MIL/uL (ref 4.22–5.81)
WBC: 5.3 10*3/uL (ref 4.0–10.5)

## 2013-06-09 LAB — BASIC METABOLIC PANEL
CO2: 26 mEq/L (ref 19–32)
Chloride: 108 mEq/L (ref 96–112)
Creatinine, Ser: 1.17 mg/dL (ref 0.50–1.35)

## 2013-06-09 NOTE — ED Notes (Addendum)
Presents with sudden onset of throbbing headache over left eye began at 8 am associated with nausea, vomiting, sensitivity to light and sound. Pain is better with eyes closed and has decreased since this am. ALert, oriented, no facial droop or arm drift. PERRLA.  Pain began while sitting at [employer] studying at a computer.  Pt drinking fruit punch, pleasant affect, smiling and rates pain at 5/10. No history of headaches or migraines. Pt also requesting STD check.

## 2013-06-09 NOTE — ED Provider Notes (Signed)
CSN: [id number]     Arrival date & time 06/09/13  1830 History   First MD Initiated Contact with Patient 06/09/13 1948     Chief Complaint  Patient presents with  . Headache   (Consider location/radiation/quality/duration/timing/severity/associated sxs/prior Treatment) The history is provided by the patient. No language interpreter was used.   Patient is 27 year-old male with no past medical history comes emergency department today with headache. His has had headaches in the past. However that this morning around 8:00 he developed a sudden onset headache that was worse headache ever had in his life. He was initially unconcerned however as the day progressed he secondary to go to the emergency department for evaluation. He feels that his headache has decreased as the day has progressed. He has not had any weakness, numbness, visual disturbances, neck pain, fevers, or chills. He does have a history of chronic sinusitis and has had some nasal congestion today. In addition to his headache patient requests a screen for STDs. He would like to be screened for gonorrhea and Chlamydia. He does not want to be screened for HIV or syphilis.  Past Medical History  Diagnosis Date  . Fracture of phalanx of finger 12/26/2011    right ring prox. phalanx  . Abrasion of finger of right hand 12/28/2011    healing wound right index finger   Past Surgical History  Procedure Laterality Date  . No past surgeries     History reviewed. No pertinent family history. History  Substance Use Topics  . Smoking status: Never Smoker   . Smokeless tobacco: Never Used  . Alcohol Use: Yes     Comment: occasionally    Review of Systems  Constitutional: Negative for fever and chills.  Respiratory: Negative for cough and shortness of breath.   Genitourinary: Negative for dysuria, urgency and frequency.  Musculoskeletal: Negative for back pain, neck pain and neck stiffness.  Neurological: Positive for headaches. Negative  for dizziness, seizures, facial asymmetry, weakness and numbness.  All other systems reviewed and are negative.    Allergies  Review of patient's allergies indicates no known allergies.  Home Medications   Current Outpatient Rx  Name  Route  Sig  Dispense  Refill  . ibuprofen (ADVIL,MOTRIN) 200 MG tablet   Oral   Take 800 mg by mouth daily as needed for pain. For pain          BP 112/56  Pulse 56  Temp(Src) 98.2 F (36.8 C) (Oral)  Resp 16  Wt 164 lb 4 oz (74.503 kg)  SpO2 99% Physical Exam  Nursing note and vitals reviewed. Constitutional: He is oriented to person, place, and time. He appears well-developed and well-nourished. No distress.  HENT:  Head: Normocephalic and atraumatic.  Eyes: Pupils are equal, round, and reactive to light.  Neck: Normal range of motion.  Cardiovascular: Normal rate, regular rhythm and normal heart sounds.   Pulmonary/Chest: Effort normal and breath sounds normal. No respiratory distress. He has no decreased breath sounds. He has no wheezes. He has no rhonchi. He has no rales.  Abdominal: Soft. He exhibits no distension. There is no tenderness. There is no rebound and no guarding. Hernia confirmed negative in the right inguinal area and confirmed negative in the left inguinal area.  Genitourinary: Testes normal. Right testis shows no swelling and no tenderness. Cremasteric reflex is not absent on the right side. Left testis shows no swelling and no tenderness. Cremasteric reflex is not absent on the left side. Circumcised.  No penile tenderness. No discharge found.  Musculoskeletal: He exhibits no edema and no tenderness.  Neurological: He is alert and oriented to person, place, and time. He has normal strength. No cranial nerve deficit or sensory deficit. He exhibits normal muscle tone. Coordination and gait normal.  Skin: Skin is warm and dry.    ED Course  Procedures (including critical care time) Labs Review Labs Reviewed  CBC WITH  DIFFERENTIAL - Abnormal; Notable for the following:    HCT 38.2 (*)    MCHC 36.1 (*)    Eosinophils Relative 8 (*)    All other components within normal limits  BASIC METABOLIC PANEL - Abnormal; Notable for the following:    GFR calc non Af Amer 84 (*)    All other components within normal limits  GC/CHLAMYDIA PROBE AMP   Imaging Review Ct Head Wo Contrast  06/09/2013   CLINICAL DATA:  Headache. Left periorbital pain. Nausea and vomiting. Photophobia.  EXAM: CT HEAD WITHOUT CONTRAST  TECHNIQUE: Contiguous axial images were obtained from the base of the skull through the vertex without intravenous contrast.  COMPARISON:  None.  FINDINGS: The brainstem, cerebellum, cerebral peduncles, thalamus, basal ganglia, basilar cisterns, and ventricular system appear within normal limits. No intracranial hemorrhage, mass lesion, or acute CVA. Chronic ethmoid sinusitis. Mucosal thickening in the upper portion of the nasal cavity. Mild chronic sphenoid sinusitis.  No specific abnormality in the visualized portion of the left orbit.  IMPRESSION: 1. Chronic ethmoid and sphenoid sinusitis. 2. No significant intracranial abnormality.   Electronically Signed   By: Herbie Baltimore M.D.   On: 06/09/2013 21:25    EKG Interpretation   None       MDM  Patient is a 27 year old African American male comes emergency department today with headache. Physical exam as above. With patient having the worst headache of his life with sudden onset this is concerning for subarachnoid hemorrhage. Although patient does not have any neck pain or neurologic deficits he is felt to require evaluation for subarachnoid hemorrhage. Initial workup included a CBC, BMP, CT of his head. CBC was unremarkable. BMP was unremarkable. CT of the head demonstrated no acute abnormality. Patient's history was concerning enough that he was felt to require lumbar puncture to evaluate for blood in the CSF. The need for lumbar puncture was discussed with  the patient. He did not want to obtain a lumbar puncture. His reason was that he did not have enough money to do this. He was informed of the hospital will be able to assist him with payment plans.  Patient continued to refuse lumbar puncture. He stated that he came to the emergency department to obtain piece of mind. He felt that at this point he has a piece of mind and is worried for financial reasons. Patient was informed that a normal CT scan does not mean that he does not have subarachnoid hemorrhage. He was also informed that he can have a relatively normal neurologic exam and still have subarachnoid hemorrhage. When that by the time neurologic deficits develop this may be too late to treat for subarachnoid hemorrhage effectively. Patient expressed understanding of this. He stated that he still felt the best option for him was to go home and stated that he would return to emergency department if he develops any neurologic symptoms or neck pain. The patient signed out AGAINST MEDICAL ADVICE is provided with strict return precautions. He was instructed to followup with her primary care physician within the next 2  days to ensure that his exam is not changing.  Patient's expressed understanding.  Labs and imaging reviewed by myself and considered and medical decision-making. Imaging was interpreted radiology. Care was discussed with my attending Dr. Manus Gunning.   1. Headache   2. Screen for STD (sexually transmitted disease)      Bethann Berkshire, MD 06/09/13 2242

## 2013-06-10 NOTE — ED Provider Notes (Signed)
I saw and evaluated the patient, reviewed the resident's note and I agree with the findings and plan. If applicable, I agree with the resident's interpretation of the EKG.  If applicable, I was present for critical portions of any procedures performed.  Sudden onset headache at 8am that has gradually improved. Hx chronic sinusitis, no worse than usual. No previous history of headache. No meningimus.  CN 2-12 intact, no ataxia on finger to nose, no nystagmus, 5/5 strength throughout, no pronator drift, Romberg negative, normal gait. CT/LP recommended as headache was sudden onset. Patient refuses LP.  He understands the small possibility of SAH remains and has capacity to refuse. He understands and accepts the risks of deterioration and death and will leave AMA.  Glynn Octave, MD 06/10/13 [phone]

## 2013-06-11 LAB — GC/CHLAMYDIA PROBE AMP: GC Probe RNA: NEGATIVE

## 2014-11-29 ENCOUNTER — Ambulatory Visit: Payer: Self-pay

## 2014-12-13 ENCOUNTER — Other Ambulatory Visit: Payer: Self-pay

## 2014-12-13 NOTE — Patient Outreach (Signed)
[facility] Care Management  12/13/2014  [facility] 01/20/1986 [id number]   [facility] for [facility] were sent on wrong patient.  Corrie MckusickLisa O. Sharlee BlewMoore, AABA [facility] Care [facility] CM Assistant Phone: [phone] Fax: [phone]

## 2014-12-13 NOTE — Patient Outreach (Signed)
[facility]) Care Management  12/13/2014  Steve Flores [facility] 06/23/1986 [id number]   Assigned Steve Rattlerawn Pettus, PharmD based on message from Emilia BeckPamela Faulkner, RN.  Corrie MckusickLisa O. Sharlee BlewMoore, AABA [facility] Care Management [facility] CM Assistant Phone: [phone] Fax: [phone]

## 2014-12-15 NOTE — Patient Outreach (Signed)
This encounter was created in error - please disregard.  Steve Rattlerawn Mahli Glahn, PharmD, [employer]ACP [facility] Pharmacy Manager [phone]

## 2014-12-17 ENCOUNTER — Ambulatory Visit: Payer: Self-pay | Admitting: *Deleted

## 2015-03-02 ENCOUNTER — Encounter (HOSPITAL_COMMUNITY): Payer: Self-pay | Admitting: *Deleted

## 2015-03-02 ENCOUNTER — Emergency Department (HOSPITAL_COMMUNITY)
Admission: EM | Admit: 2015-03-02 | Discharge: 2015-03-02 | Disposition: A | Discharge status: Home / Self Care | Payer: Self-pay | Attending: Emergency Medicine | Admitting: Emergency Medicine

## 2015-03-02 DIAGNOSIS — Z87828 Personal history of other (healed) physical injury and trauma: Secondary | ICD-10-CM | POA: Insufficient documentation

## 2015-03-02 DIAGNOSIS — G43009 Migraine without aura, not intractable, without status migrainosus: Secondary | ICD-10-CM

## 2015-03-02 DIAGNOSIS — Z8781 Personal history of (healed) traumatic fracture: Secondary | ICD-10-CM | POA: Insufficient documentation

## 2015-03-02 LAB — BASIC METABOLIC PANEL
ANION GAP: 5 (ref 5–15)
BUN: 11 mg/dL (ref 6–20)
CHLORIDE: 104 mmol/L (ref 101–111)
CO2: 30 mmol/L (ref 22–32)
Calcium: 8.3 mg/dL — ABNORMAL LOW (ref 8.9–10.3)
Creatinine, Ser: 1.3 mg/dL — ABNORMAL HIGH (ref 0.61–1.24)
GFR calc non Af Amer: >60 mL/min (ref >60)
Glucose, Bld: 87 mg/dL (ref 65–99)
POTASSIUM: 3.9 mmol/L (ref 3.5–5.1)
SODIUM: 139 mmol/L (ref 135–145)

## 2015-03-02 LAB — CBC WITH DIFFERENTIAL/PLATELET
BASOS ABS: 0.1 10*3/uL (ref 0.0–0.1)
BASOS PCT: 2 % — AB (ref 0–1)
Eosinophils Absolute: 0.4 10*3/uL (ref 0.0–0.7)
Eosinophils Relative: 6 % — ABNORMAL HIGH (ref 0–5)
HCT: 40.9 % (ref 39.0–52.0)
HEMOGLOBIN: 14.7 g/dL (ref 13.0–17.0)
LYMPHS ABS: 4.1 10*3/uL — AB (ref 0.7–4.0)
LYMPHS PCT: 57 % — AB (ref 12–46)
MCH: 28.2 pg (ref 26.0–34.0)
MCHC: 35.9 g/dL (ref 30.0–36.0)
MCV: 78.5 fL (ref 78.0–100.0)
MONOS PCT: 11 % (ref 3–12)
Monocytes Absolute: 0.8 10*3/uL (ref 0.1–1.0)
NEUTROS ABS: 1.7 10*3/uL (ref 1.7–7.7)
Neutrophils Relative %: 24 % — ABNORMAL LOW (ref 43–77)
PLATELETS: 168 10*3/uL (ref 150–400)
RBC: 5.21 MIL/uL (ref 4.22–5.81)
RDW: 14.3 % (ref 11.5–15.5)
WBC: 7.1 10*3/uL (ref 4.0–10.5)

## 2015-03-02 MED ORDER — DIPHENHYDRAMINE HCL 50 MG/ML IJ SOLN
25.0000 mg | Freq: Once | INTRAMUSCULAR | Status: AC
  Administered 2015-03-02: 25 mg via INTRAVENOUS
  Filled 2015-03-02: qty 1

## 2015-03-02 MED ORDER — METOCLOPRAMIDE HCL 5 MG/ML IJ SOLN
10.0000 mg | Freq: Once | INTRAMUSCULAR | Status: AC
  Administered 2015-03-02: 10 mg via INTRAVENOUS
  Filled 2015-03-02: qty 2

## 2015-03-02 MED ORDER — SODIUM CHLORIDE 0.9 % IV SOLN
1000.0000 mL | Freq: Once | INTRAVENOUS | Status: AC
  Administered 2015-03-02: 1000 mL via INTRAVENOUS

## 2015-03-02 MED ORDER — SODIUM CHLORIDE 0.9 % IV SOLN
1000.0000 mL | INTRAVENOUS | Status: DC
  Administered 2015-03-02: 1000 mL via INTRAVENOUS

## 2015-03-02 MED ORDER — DEXAMETHASONE SODIUM PHOSPHATE 10 MG/ML IJ SOLN
10.0000 mg | Freq: Once | INTRAMUSCULAR | Status: AC
  Administered 2015-03-02: 10 mg via INTRAVENOUS
  Filled 2015-03-02: qty 1

## 2015-03-02 MED ORDER — METOCLOPRAMIDE HCL 10 MG PO TABS: 10.0000 mg | ORAL_TABLET | Freq: Four times a day (QID) | ORAL | Status: DC | PRN

## 2015-03-02 NOTE — ED Provider Notes (Signed)
CSN: [id number]     Arrival date & time 03/02/15  0141 History  This chart was scribed for Dione Booze, MD by Evon Slack, ED Scribe. This patient was seen in room B15C/B15C and the patient's care was started at 3:39 AM.    Chief Complaint  Patient presents with  . Headache   The history is provided by the patient. No language interpreter was used.   HPI Comments: Steve Flores is a 29 y.o. male who presents to the Emergency Department complaining of generalized throbbing HA onset 4 days. Pt states the HA is now in his temporals. Pt rates the severity of the HA 7/10.  Pt has associated nausea, subjective fever, chills, sweats and generalized weakness. Pt states that bright lights and loud noises makes the HA worse. Pt states that he has tried Excedrin migraine, ibuprofen with no relief. Denies vision changes, numbness vomiting or rash.   Past Medical History  Diagnosis Date  . Fracture of phalanx of finger 12/26/2011    right ring prox. phalanx  . Abrasion of finger of right hand 12/28/2011    healing wound right index finger   Past Surgical History  Procedure Laterality Date  . No past surgeries     No family history on file. Social History  Substance Use Topics  . Smoking status: Never Smoker   . Smokeless tobacco: Never Used  . Alcohol Use: Yes     Comment: occasionally    Review of Systems  Constitutional: Positive for fever, chills and diaphoresis.  Eyes: Negative for visual disturbance.  Gastrointestinal: Positive for nausea. Negative for vomiting.  Skin: Negative for rash.  Neurological: Positive for weakness and headaches. Negative for numbness.  All other systems reviewed and are negative.     Allergies  Review of patient's allergies indicates no known allergies.  Home Medications   Prior to Admission medications   Medication Sig Start Date End Date Taking? Authorizing Provider  ibuprofen (ADVIL,MOTRIN) 200 MG tablet Take 800 mg by mouth daily as needed for  pain. For pain    Historical Provider, MD   BP 110/60 mmHg  Pulse 82  Temp(Src) 98.4 F (36.9 C) (Oral)  Resp 18  Ht 5\' 9"  (1.753 m)  Wt 172 lb (78.019 kg)  BMI 25.39 kg/m2  SpO2 97%   Physical Exam  Constitutional: He is oriented to person, place, and time. He appears well-developed and well-nourished. No distress.  HENT:  Head: Normocephalic and atraumatic.  Eyes: Conjunctivae and EOM are normal. Pupils are equal, round, and reactive to light.  Fundi normal.   Neck: Normal range of motion. Neck supple. No JVD present.  Cardiovascular: Normal rate, regular rhythm and normal heart sounds.   No murmur heard. Pulmonary/Chest: Effort normal and breath sounds normal. He has no wheezes. He has no rales. He exhibits no tenderness.  Abdominal: Soft. Bowel sounds are normal. He exhibits no distension and no mass. There is no tenderness.  Musculoskeletal: Normal range of motion. He exhibits no edema.  Lymphadenopathy:    He has no cervical adenopathy.  Neurological: He is alert and oriented to person, place, and time. No cranial nerve deficit. He exhibits normal muscle tone. Coordination normal.  Skin: Skin is warm and dry. No rash noted.  Psychiatric: He has a normal mood and affect. His behavior is normal. Judgment and thought content normal.  Nursing note and vitals reviewed.   ED Course  Procedures (including critical care time) DIAGNOSTIC STUDIES: Oxygen Saturation is 97% on  RA, normal by my interpretation.    COORDINATION OF CARE: 3:46 AM-Discussed treatment plan with pt at bedside and pt agreed to plan.     Labs Review Results for orders placed or performed during the hospital encounter of 03/02/15  CBC with Differential  Result Value Ref Range   WBC 7.1 4.0 - 10.5 K/uL   RBC 5.21 4.22 - 5.81 MIL/uL   Hemoglobin 14.7 13.0 - 17.0 g/dL   HCT 16.1 09.6 - 04.5 %   MCV 78.5 78.0 - 100.0 fL   MCH 28.2 26.0 - 34.0 pg   MCHC 35.9 30.0 - 36.0 g/dL   RDW 40.9 81.1 - 91.4 %    Platelets 168 150 - 400 K/uL   Neutrophils Relative % 24 (L) 43 - 77 %   Lymphocytes Relative 57 (H) 12 - 46 %   Monocytes Relative 11 3 - 12 %   Eosinophils Relative 6 (H) 0 - 5 %   Basophils Relative 2 (H) 0 - 1 %   Neutro Abs 1.7 1.7 - 7.7 K/uL   Lymphs Abs 4.1 (H) 0.7 - 4.0 K/uL   Monocytes Absolute 0.8 0.1 - 1.0 K/uL   Eosinophils Absolute 0.4 0.0 - 0.7 K/uL   Basophils Absolute 0.1 0.0 - 0.1 K/uL   WBC Morphology ATYPICAL LYMPHOCYTES   Basic metabolic panel  Result Value Ref Range   Sodium 139 135 - 145 mmol/L   Potassium 3.9 3.5 - 5.1 mmol/L   Chloride 104 101 - 111 mmol/L   CO2 30 22 - 32 mmol/L   Glucose, Bld 87 65 - 99 mg/dL   BUN 11 6 - 20 mg/dL   Creatinine, Ser 7.82 (H) 0.61 - 1.24 mg/dL   Calcium 8.3 (L) 8.9 - 10.3 mg/dL   GFR calc non Af Amer >60 >60 mL/min   GFR calc Af Amer >60 >60 mL/min   Anion gap 5 5 - 15     MDM   Final diagnoses:  Migraine without aura and without status migrainosus, not intractable      Headache which seems fairly characteristic of migraine. No red flags to suggest more serious causes of headache. He will be given a migraine cocktail of normal saline, metoclopramide, diphenhydramine and reassessed. Screening labs are obtained. Review of old records shows prior ED visits for headaches.  He had excellent relief of his headache with above noted treatment. He is discharged with a prescription for metoclopramide.  I personally performed the services described in this documentation, which was scribed in my presence. The recorded information has been reviewed and is accurate.       Dione Booze, MD 03/02/15 [phone]

## 2015-03-02 NOTE — Discharge Instructions (Signed)
Migraine Headache A migraine headache is an intense, throbbing pain on one or both sides of your head. A migraine can last for 30 minutes to several hours. CAUSES  The exact cause of a migraine headache is not always known. However, a migraine may be caused when nerves in the brain become irritated and release chemicals that cause inflammation. This causes pain. Certain things may also trigger migraines, such as:  Alcohol.  Smoking.  Stress.  Menstruation.  Aged cheeses.  Foods or drinks that contain nitrates, glutamate, aspartame, or tyramine.  Lack of sleep.  Chocolate.  Caffeine.  Hunger.  Physical exertion.  Fatigue.  Medicines used to treat chest pain (nitroglycerine), birth control pills, estrogen, and some blood pressure medicines. SIGNS AND SYMPTOMS  Pain on one or both sides of your head.  Pulsating or throbbing pain.  Severe pain that prevents daily activities.  Pain that is aggravated by any physical activity.  Nausea, vomiting, or both.  Dizziness.  Pain with exposure to bright lights, loud noises, or activity.  General sensitivity to bright lights, loud noises, or smells. Before you get a migraine, you may get warning signs that a migraine is coming (aura). An aura may include:  Seeing flashing lights.  Seeing bright spots, halos, or zigzag lines.  Having tunnel vision or blurred vision.  Having feelings of numbness or tingling.  Having trouble talking.  Having muscle weakness. DIAGNOSIS  A migraine headache is often diagnosed based on:  Symptoms.  Physical exam.  A CT scan or MRI of your head. These imaging tests cannot diagnose migraines, but they can help rule out other causes of headaches. TREATMENT Medicines may be given for pain and nausea. Medicines can also be given to help prevent recurrent migraines.  HOME CARE INSTRUCTIONS  Only take over-the-counter or prescription medicines for pain or discomfort as directed by your  health care provider. The use of long-term narcotics is not recommended.  Lie down in a dark, quiet room when you have a migraine.  Keep a journal to find out what may trigger your migraine headaches. For example, write down:  What you eat and drink.  How much sleep you get.  Any change to your diet or medicines.  Limit alcohol consumption.  Quit smoking if you smoke.  Get 7-9 hours of sleep, or as recommended by your health care provider.  Limit stress.  Keep lights dim if bright lights bother you and make your migraines worse. SEEK IMMEDIATE MEDICAL CARE IF:   Your migraine becomes severe.  You have a fever.  You have a stiff neck.  You have vision loss.  You have muscular weakness or loss of muscle control.  You start losing your balance or have trouble walking.  You feel faint or pass out.  You have severe symptoms that are different from your first symptoms. MAKE SURE YOU:   Understand these instructions.  Will watch your condition.  Will get help right away if you are not doing well or get worse. Document Released: 07/09/2005 Document Revised: 11/23/2013 Document Reviewed: 03/16/2013 [facility] Patient Information 2015 [place], [place]. This information is not intended to replace advice given to you by your health care provider. Make sure you discuss any questions you have with your health care provider.  Metoclopramide tablets What is this medicine? METOCLOPRAMIDE (met oh kloe PRA mide) is used to treat the symptoms of gastroesophageal reflux disease (GERD) like heartburn. It is also used to treat people with slow emptying of the stomach and  intestinal tract. This medicine may be used for other purposes; ask your health care provider or pharmacist if you have questions. COMMON BRAND NAME(S): Reglan What should I tell my health care provider before I take this medicine? They need to know if you have any of these conditions: -breast  cancer -depression -diabetes -heart failure -high blood pressure -kidney disease -liver disease -Parkinson's disease or a movement disorder -pheochromocytoma -seizures -stomach obstruction, bleeding, or perforation -an unusual or allergic reaction to metoclopramide, procainamide, sulfites, other medicines, foods, dyes, or preservatives -pregnant or trying to get pregnant -breast-feeding How should I use this medicine? Take this medicine by mouth with a glass of water. Follow the directions on the prescription label. Take this medicine on an empty stomach, about 30 minutes before eating. Take your doses at regular intervals. Do not take your medicine more often than directed. Do not stop taking except on the advice of your doctor or health care professional. A special MedGuide will be given to you by the pharmacist with each prescription and refill. Be sure to read this information carefully each time. Talk to your pediatrician regarding the use of this medicine in children. Special care may be needed. Overdosage: If you think you have taken too much of this medicine contact a poison control center or emergency room at once. NOTE: This medicine is only for you. Do not share this medicine with others. What if I miss a dose? If you miss a dose, take it as soon as you can. If it is almost time for your next dose, take only that dose. Do not take double or extra doses. What may interact with this medicine? -acetaminophen -cyclosporine -digoxin -medicines for blood pressure -medicines for diabetes, including insulin -medicines for hay fever and other allergies -medicines for depression, especially an Monoamine Oxidase Inhibitor (MAOI) -medicines for Parkinson's disease, like levodopa -medicines for sleep or for pain -tetracycline This list may not describe all possible interactions. Give your health care provider a list of all the medicines, herbs, non-prescription drugs, or dietary  supplements you use. Also tell them if you smoke, drink alcohol, or use illegal drugs. Some items may interact with your medicine. What should I watch for while using this medicine? It may take a few weeks for your stomach condition to start to get better. However, do not take this medicine for longer than 12 weeks. The longer you take this medicine, and the more you take it, the greater your chances are of developing serious side effects. If you are an elderly patient, a male patient, or you have diabetes, you may be at an increased risk for side effects from this medicine. Contact your doctor immediately if you start having movements you cannot control such as lip smacking, rapid movements of the tongue, involuntary or uncontrollable movements of the eyes, head, arms and legs, or muscle twitches and spasms. Patients and their families should watch out for worsening depression or thoughts of suicide. Also watch out for any sudden or severe changes in feelings such as feeling anxious, agitated, panicky, irritable, hostile, aggressive, impulsive, severely restless, overly excited and hyperactive, or not being able to sleep. If this happens, especially at the beginning of treatment or after a change in dose, call your doctor. Do not treat yourself for high fever. Ask your doctor or health care professional for advice. You may get drowsy or dizzy. Do not drive, use machinery, or do anything that needs mental alertness until you know how this drug affects you.  Do not stand or sit up quickly, especially if you are an older patient. This reduces the risk of dizzy or fainting spells. Alcohol can make you more drowsy and dizzy. Avoid alcoholic drinks. What side effects may I notice from receiving this medicine? Side effects that you should report to your doctor or health care professional as soon as possible: -allergic reactions like skin rash, itching or hives, swelling of the face, lips, or tongue -abnormal  production of milk in females -breast enlargement in both males and females -change in the way you walk -difficulty moving, speaking or swallowing -drooling, lip smacking, or rapid movements of the tongue -excessive sweating -fever -involuntary or uncontrollable movements of the eyes, head, arms and legs -irregular heartbeat or palpitations -muscle twitches and spasms -unusually weak or tired Side effects that usually do not require medical attention (report to your doctor or health care professional if they continue or are bothersome): -change in sex drive or performance -depressed mood -diarrhea -difficulty sleeping -headache -menstrual changes -restless or nervous This list may not describe all possible side effects. Call your doctor for medical advice about side effects. You may report side effects to FDA at 1-800-FDA-1088. Where should I keep my medicine? Keep out of the reach of children. Store at room temperature between 20 and 25 degrees C (68 and 77 degrees F). Protect from light. Keep container tightly closed. Throw away any unused medicine after the expiration date. NOTE: This sheet is a summary. It may not cover all possible information. If you have questions about this medicine, talk to your doctor, pharmacist, or health care provider.  2015, Elsevier/[employer] Standard. (2011-11-06 13:04:38)

## 2015-03-02 NOTE — ED Notes (Signed)
Patient presents with c/o headache times 4 days  States he has photophobia and nausea.  Never had a headache like this before

## 2015-04-19 ENCOUNTER — Encounter (HOSPITAL_COMMUNITY): Payer: Self-pay | Admitting: *Deleted

## 2015-04-19 ENCOUNTER — Emergency Department (INDEPENDENT_AMBULATORY_CARE_PROVIDER_SITE_OTHER)
Admission: EM | Admit: 2015-04-19 | Discharge: 2015-04-19 | Disposition: A | Discharge status: Home / Self Care | Payer: Self-pay | Source: Home / Self Care | Attending: Family Medicine | Admitting: Family Medicine

## 2015-04-19 DIAGNOSIS — K4090 Unilateral inguinal hernia, without obstruction or gangrene, not specified as recurrent: Secondary | ICD-10-CM

## 2015-04-19 NOTE — ED Notes (Signed)
Pt has  A  Hernia   l  Side        That  He  Has  Had  For   About  1  Month           No  naasea  No  Vomiting       Sitting  Upright on the  Exam table  Speaking  In  Complete  sentances

## 2015-04-19 NOTE — Discharge Instructions (Signed)
°  You have been referred to [facility] surgery. The referral is in the computer system called Epic and all you need to do is call [facility] for an appointment.   Hernia A hernia occurs when an internal organ pushes out through a weak spot in the abdominal wall. Hernias most commonly occur in the groin and around the navel. Hernias often can be pushed back into place (reduced). Most hernias tend to get worse over time. Some abdominal hernias can get stuck in the opening (irreducible or incarcerated hernia) and cannot be reduced. An irreducible abdominal hernia which is tightly squeezed into the opening is at risk for impaired blood supply (strangulated hernia). A strangulated hernia is a medical emergency. Because of the risk for an irreducible or strangulated hernia, surgery may be recommended to repair a hernia. CAUSES   Heavy lifting.  Prolonged coughing.  Straining to have a bowel movement.  A cut (incision) made during an abdominal surgery. HOME CARE INSTRUCTIONS   Bed rest is not required. You may continue your normal activities.  Avoid lifting more than 10 pounds (4.5 kg) or straining.  Cough gently. If you are a smoker it is best to stop. Even the best hernia repair can break down with the continual strain of coughing. Even if you do not have your hernia repaired, a cough will continue to aggravate the problem.  Do not wear anything tight over your hernia. Do not try to keep it in with an outside bandage or truss. These can damage abdominal contents if they are trapped within the hernia sac.  Eat a normal diet.  Avoid constipation. Straining over long periods of time will increase hernia size and encourage breakdown of repairs. If you cannot do this with diet alone, stool softeners may be used. SEEK IMMEDIATE MEDICAL CARE IF:   You have a fever.  You develop increasing abdominal pain.  You feel nauseous or vomit.  Your hernia is stuck outside the  abdomen, looks discolored, feels hard, or is tender.  You have any changes in your bowel habits or in the hernia that are unusual for you.  You have increased pain or swelling around the hernia.  You cannot push the hernia back in place by applying gentle pressure while lying down. MAKE SURE YOU:   Understand these instructions.  Will watch your condition.  Will get help right away if you are not doing well or get worse. Document Released: 07/09/2005 Document Revised: 10/01/2011 Document Reviewed: 02/26/2008 [facility] Patient Information 2015 [place], [place]. This information is not intended to replace advice given to you by your health care provider. Make sure you discuss any questions you have with your health care provider.

## 2015-04-19 NOTE — ED Provider Notes (Signed)
CSN: [id number]     Arrival date & time 04/19/15  1925 History   First MD Initiated Contact with Patient 04/19/15 2003     Chief Complaint  Patient presents with  . 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 Several months ago healing against the sharp end of his work truck and felt pain in his left groin. Supplements of this he noticed was a bulge that didn't go away. He's not sure if leaning into the work truck caused the swelling or whether that bulge would've happened anyway. He did report feeling some discomfort there to his supervisor, but nothing was ever written down. Past Medical History  Diagnosis Date  . Fracture of phalanx of finger 12/26/2011    right ring prox. phalanx  . Abrasion of finger of right hand 12/28/2011    healing wound right index finger   Past Surgical History  Procedure Laterality Date  . No past surgeries     History reviewed. No pertinent family history. Social History  Substance Use Topics  . Smoking status: Never Smoker   . Smokeless tobacco: Never Used  . Alcohol Use: Yes     Comment: occasionally    Review of Systems  Constitutional: Negative.   HENT: Negative.   Eyes: Negative.   Respiratory: Negative.   Cardiovascular: Negative.   Gastrointestinal:       Left groin swelling and mild discomfort  Genitourinary: Negative for dysuria, urgency, frequency, hematuria, flank pain, decreased urine volume, discharge, penile swelling, scrotal swelling, enuresis, genital sores, penile pain and testicular pain.  Musculoskeletal: Negative.   Skin: Negative.     Allergies  Review of patient's allergies indicates no known allergies.  Home Medications   Prior to Admission medications   Medication Sig Start Date End Date Taking? Authorizing Provider  ibuprofen  (ADVIL,MOTRIN) 200 MG tablet Take 800 mg by mouth daily as needed for pain. For pain    Historical Provider, MD  metoCLOPramide (REGLAN) 10 MG tablet Take 1 tablet (10 mg total) by mouth every 6 (six) hours as needed for nausea (or headache). 03/02/15   Dione Booze, MD   Meds Ordered and Administered this Visit  Medications - No data to display  BP 118/84 mmHg  Pulse 88  Temp(Src) 98.3 F (36.8 C) (Oral)  Resp 18  SpO2 97% No data found.   Physical Exam  Constitutional: He is oriented to person, place, and time. He appears well-developed and well-nourished.  HENT:  Head: Normocephalic and atraumatic.  Eyes: Conjunctivae are normal. Pupils are equal, round, and reactive to light.  Neck: Normal range of motion. Neck supple.  Abdominal: Soft.  Patient has a reducible left inguinal hernia  Musculoskeletal: Normal range of motion.  Neurological: He is alert and oriented to person, place, and time.  Skin: Skin is dry.  Psychiatric: He has a normal mood and affect. His behavior is normal. Thought content normal.    ED Course  Procedures (including critical care time)      MDM  Assessment: Reducible left inguinal hernia. This should be repaired as soon as is convenient for the patient because he does much heavy lifting and he likes to exercise.  It's impossible to say whether this inguinal hernia was caused from work or just working out.  Plan: Surgical referral  Signed, Elvina Sidle M.D.    Elvina Sidle, MD 04/19/15 2017

## 2015-09-06 ENCOUNTER — Emergency Department (HOSPITAL_BASED_OUTPATIENT_CLINIC_OR_DEPARTMENT_OTHER)
Admission: EM | Admit: 2015-09-06 | Discharge: 2015-09-06 | Disposition: A | Discharge status: Home / Self Care | Payer: Self-pay | Attending: Emergency Medicine | Admitting: Emergency Medicine

## 2015-09-06 ENCOUNTER — Encounter (HOSPITAL_BASED_OUTPATIENT_CLINIC_OR_DEPARTMENT_OTHER): Payer: Self-pay | Admitting: Emergency Medicine

## 2015-09-06 DIAGNOSIS — K4190 Unilateral femoral hernia, without obstruction or gangrene, not specified as recurrent: Secondary | ICD-10-CM | POA: Insufficient documentation

## 2015-09-06 DIAGNOSIS — Z87828 Personal history of other (healed) physical injury and trauma: Secondary | ICD-10-CM | POA: Insufficient documentation

## 2015-09-06 DIAGNOSIS — Z8781 Personal history of (healed) traumatic fracture: Secondary | ICD-10-CM | POA: Insufficient documentation

## 2015-09-06 LAB — URINALYSIS, ROUTINE W REFLEX MICROSCOPIC
BILIRUBIN URINE: NEGATIVE
GLUCOSE, UA: NEGATIVE mg/dL
HGB URINE DIPSTICK: NEGATIVE
Ketones, ur: NEGATIVE mg/dL
Nitrite: NEGATIVE
Protein, ur: NEGATIVE mg/dL
SPECIFIC GRAVITY, URINE: 1.026 (ref 1.005–1.030)
pH: 5.5 (ref 5.0–8.0)

## 2015-09-06 LAB — URINE MICROSCOPIC-ADD ON

## 2015-09-06 NOTE — ED Notes (Signed)
Pt states left groin pain with mass with pain today. States it has felt weird before but never this bad.

## 2015-09-06 NOTE — Discharge Instructions (Signed)
Hernia, Adult A hernia is the bulging of an organ or tissue through a weak spot in the muscles of the abdomen (abdominal wall). Hernias develop most often near the navel or groin. There are many kinds of hernias. Common kinds include:  Femoral hernia. This kind of hernia develops under the groin in the upper thigh area.  Inguinal hernia. This kind of hernia develops in the groin or scrotum.  Umbilical hernia. This kind of hernia develops near the navel.  Hiatal hernia. This kind of hernia causes part of the stomach to be pushed up into the chest.  Incisional hernia. This kind of hernia bulges through a scar from an abdominal surgery. CAUSES This condition may be caused by:  Heavy lifting.  Coughing over a long period of time.  Straining to have a bowel movement.  An incision made during an abdominal surgery.  A birth defect (congenital defect).  Excess weight or obesity.  Smoking.  Poor nutrition.  Cystic fibrosis.  Excess fluid in the abdomen.  Undescended testicles. SYMPTOMS Symptoms of a hernia include:  A lump on the abdomen. This is the first sign of a hernia. The lump may become more obvious with standing, straining, or coughing. It may get bigger over time if it is not treated or if the condition causing it is not treated.  Pain. A hernia is usually painless, but it may become painful over time if treatment is delayed. The pain is usually dull and may get worse with standing or lifting heavy objects. Sometimes a hernia gets tightly squeezed in the weak spot (strangulated) or stuck there (incarcerated) and causes additional symptoms. These symptoms may include:  Vomiting.  Nausea.  Constipation.  Irritability. DIAGNOSIS A hernia may be diagnosed with:  A physical exam. During the exam your health care provider may ask you to cough or to make a specific movement, because a hernia is usually more visible when you move.  Imaging tests. These can  include:  X-rays.  Ultrasound.  CT scan. TREATMENT A hernia that is small and painless may not need to be treated. A hernia that is large or painful may be treated with surgery. Inguinal hernias may be treated with surgery to prevent incarceration or strangulation. Strangulated hernias are always treated with surgery, because lack of blood to the trapped organ or tissue can cause it to die. Surgery to treat a hernia involves pushing the bulge back into place and repairing the weak part of the abdomen. HOME CARE INSTRUCTIONS  Avoid straining.  Do not lift anything heavier than 10 lb (4.5 kg).  Lift with your leg muscles, not your back muscles. This helps avoid strain.  When coughing, try to cough gently.  Prevent constipation. Constipation leads to straining with bowel movements, which can make a hernia worse or cause a hernia repair to break down. You can prevent constipation by:  Eating a high-fiber diet that includes plenty of fruits and vegetables.  Drinking enough fluids to keep your urine clear or pale yellow. Aim to drink 6-8 glasses of water per day.  Using a stool softener as directed by your health care provider.  Lose weight, if you are overweight.  Do not use any tobacco products, including cigarettes, chewing tobacco, or electronic cigarettes. If you need help quitting, ask your health care provider.  Keep all follow-up visits as directed by your health care provider. This is important. Your health care provider may need to monitor your condition. SEEK MEDICAL CARE IF:  You have   swelling, redness, and pain in the affected area.  Your bowel habits change. SEEK IMMEDIATE MEDICAL CARE IF:  You have a fever.  You have abdominal pain that is getting worse.  You feel nauseous or you vomit.  You cannot push the hernia back in place by gently pressing on it while you are lying down.  The hernia:  Changes in shape or size.  Is stuck outside the  abdomen.  Becomes discolored.  Feels hard or tender.   This information is not intended to replace advice given to you by your health care provider. Make sure you discuss any questions you have with your health care provider.   Document Released: 07/09/2005 Document Revised: 07/30/2014 Document Reviewed: 05/19/2014 Elsevier Interactive Patient Education 2016 [employer]vier Inc[employer]  

## 2015-09-06 NOTE — ED Notes (Signed)
Pt has golf ball sized bulge in left pubic area that is painful and appeared today per patient.  No dysuria, no penile discharge, pt does not appear to be in acute pain currently.

## 2015-09-06 NOTE — ED Notes (Signed)
Pt verbalizes understanding and denies any further needs at this time. 

## 2015-09-06 NOTE — ED Provider Notes (Signed)
CSN: [id number]     Arrival date & time 09/06/15  1928 History   First MD Initiated Contact with Patient 09/06/15 2207     Chief Complaint  Patient presents with  . Groin Pain   HPI   30 year old male presents today with complaints of groin pain and swelling. Patient reports that he's had intermittent swelling to the left groin. He reports a bulge that comes with increased abdominal pressure, is relieved when he lays back flat or sits down. He reports pain when the bulge comes out, and relieved when laying back. He reports normal bowel movements, no fever. Patient cannot recall when this first started, notes that it always retracts back in. He denies any pain to his testicles, no bulging to the scrotum, abdominal pain, nausea, vomiting, fever.     Past Medical History  Diagnosis Date  . Fracture of phalanx of finger 12/26/2011    right ring prox. phalanx  . Abrasion of finger of right hand 12/28/2011    healing wound right index finger   Past Surgical History  Procedure Laterality Date  . No past surgeries     No family history on file. Social History  Substance Use Topics  . Smoking status: Never Smoker   . Smokeless tobacco: Never Used  . Alcohol Use: Yes     Comment: occasionally    Review of Systems  All other systems reviewed and are negative.   Allergies  Review of patient's allergies indicates no known allergies.  Home Medications   Prior to Admission medications   Medication Sig Start Date End Date Taking? Authorizing Provider  ibuprofen (ADVIL,MOTRIN) 200 MG tablet Take 800 mg by mouth daily as needed for pain. For pain    Historical Provider, MD  metoCLOPramide (REGLAN) 10 MG tablet Take 1 tablet (10 mg total) by mouth every 6 (six) hours as needed for nausea (or headache). 03/02/15   Dione Booze, MD   BP 118/79 mmHg  Pulse 68  Temp(Src) 98.5 F (36.9 C) (Oral)  Resp 18  Ht  (1.753 m)  Wt 79.379 kg  BMI 25.83 kg/m2  SpO2 98% Physical Exam    Constitutional: He is oriented to person, place, and time. He appears well-developed and well-nourished.  HENT:  Head: Normocephalic and atraumatic.  Eyes: Conjunctivae are normal. Pupils are equal, round, and reactive to light. Right eye exhibits no discharge. Left eye exhibits no discharge. No scleral icterus.  Neck: Normal range of motion. No JVD present. No tracheal deviation present.  Pulmonary/Chest: Effort normal. No stridor.  Abdominal:    No obvious swelling or deformity of the abdominal wall groin. Nontender to palpation  Neurological: He is alert and oriented to person, place, and time. Coordination normal.  Psychiatric: He has a normal mood and affect. His behavior is normal. Judgment and thought content normal.  Nursing note and vitals reviewed.      ED Course  Procedures (including critical care time) Labs Review Labs Reviewed  URINALYSIS, ROUTINE W REFLEX MICROSCOPIC (NOT AT [facility]) - Abnormal; Notable for the following:    Leukocytes, UA SMALL (*)    All other components within normal limits  URINE MICROSCOPIC-ADD ON - Abnormal; Notable for the following:    Squamous Epithelial / LPF 0-5 (*)    Bacteria, UA RARE (*)    All other components within normal limits    Imaging Review No results found. I have personally reviewed and evaluated these images and lab results as part of my medical  decision-making.   EKG Interpretation None      MDM   Final diagnoses:  Unilateral femoral hernia without obstruction or gangrene, recurrence not specified    Labs:  Imaging:  Consults:  Therapeutics:  Discharge Meds:   Assessment/Plan: 30 year old male presents today was likely femoral hernia. This is reducible, I see no actual hernia, exam but based on patient's description this is likely the case. Patient has normal bowel movements, no infectious etiology, no pain currently. He was given follow-up information for general surgery, encouraged contact them tomorrow  morning to schedule follow up evaluation. Patient is given strict return precautions the event hernia is nonreducible. Patient verbalized understanding and agreement for today's plan and had no further questions or concerns at time of discharge.        Eyvonne Mechanic, PA-C 09/06/15 8657  Doug Sou, MD 09/06/15 2352

## 2015-09-27 ENCOUNTER — Ambulatory Visit: Payer: Self-pay | Admitting: General Surgery

## 2015-09-27 NOTE — H&P (Signed)
History of Present Illness (Keighan Amezcua MD; 05/02/2015 2:41 PM) Patient words: Evaluate inguinal hernia.  The patient is a 29 year old male who presents with an inguinal hernia. The patient is a 29-year-old male who is referred by [facility] for evaluation of a left inguinal hernia. The patient states he suffered this approximately a month ago. He states that he was at work been over a left kidney and pulling something and felt a pain to the left inguinal area. He states that he noticed a bulge thereafter. The patient is had no signs or symptoms of incarceration or strangulation patient.   Other Problems (Christy Moore, CMA; 05/02/2015 2:07 PM) No pertinent past medical history  Past Surgical History (Christy Moore, CMA; 05/02/2015 2:07 PM) No pertinent past surgical history  Diagnostic Studies History (Christy Moore, CMA; 05/02/2015 2:07 PM) Colonoscopy never  Allergies (Christy Moore, CMA; 05/02/2015 2:07 PM) No Known Drug Allergies10/04/2015  Medication History (Christy Moore, CMA; 05/02/2015 2:08 PM) Reglan (10MG Tablet, Oral) Active. Ibuprofen (200MG Tablet, Oral) Active. Medications Reconciled  Social History (Christy Moore, CMA; 05/02/2015 2:07 PM) Alcohol use Occasional alcohol use. No caffeine use No drug use Tobacco use Never smoker.  Family History (Christy Moore, CMA; 05/02/2015 2:07 PM) Breast Cancer Mother.    Review of Systems (Izzabell Klasen MD; 05/02/2015 2:40 PM) General Not Present- Appetite Loss, Chills, Fatigue, Fever, Night Sweats, Weight Gain and Weight Loss. Skin Not Present- Change in Wart/Mole, Dryness, Hives, Jaundice, New Lesions, Non-Healing Wounds, Rash and Ulcer. HEENT Not Present- Earache, Hearing Loss, Hoarseness, Nose Bleed, Oral Ulcers, Ringing in the Ears, Seasonal Allergies, Sinus Pain, Sore Throat, Visual Disturbances, Wears glasses/contact lenses and Yellow Eyes. Respiratory Not Present- Bloody sputum, Chronic Cough,  Difficulty Breathing, Snoring and Wheezing. Cardiovascular Not Present- Chest Pain, Difficulty Breathing Lying Down, Leg Cramps, Palpitations, Rapid Heart Rate, Shortness of Breath and Swelling of Extremities. Gastrointestinal Present- Abdominal Pain. Not Present- Bloating, Bloody Stool, Change in Bowel Habits, Chronic diarrhea, Constipation, Difficulty Swallowing, Excessive gas, Gets full quickly at meals, Hemorrhoids, Indigestion, Nausea, Rectal Pain and Vomiting. Musculoskeletal Present- Swelling of Extremities. Not Present- Back Pain, Joint Pain, Joint Stiffness, Muscle Pain and Muscle Weakness. Neurological Not Present- Weakness. Psychiatric Not Present- Anxiety, Bipolar, Change in Sleep Pattern, Depression, Fearful and Frequent crying.  Vitals (Christy Moore CMA; 05/02/2015 2:07 PM) 05/02/2015 2:07 PM Weight: 169.8 lb Temp.: 98.6F(Temporal)  Pulse: 66 (Regular)  Resp.: 16 (Unlabored)  BP: 118/76 (Sitting, Left Arm, Standard)     Physical Exam (Nameer Summer, MD; 05/02/2015 2:40 00) General Mental Status-Alert. General Appearance-Consistent with stated age. Hydration-Well hydrated. Voice-Normal.  Head and Neck Head-normocephalic, atraumatic with no lesions or palpable masses. Trachea-midline.  Eye Eyeball - Bilateral-Extraocular movements intact. Sclera/Conjunctiva - Bilateral-No scleral icterus.  Chest and Lung Exam Chest and lung exam reveals -quiet, even and easy respiratory effort with no use of accessory muscles. Inspection Chest Wall - Normal. Back - normal.  Cardiovascular Cardiovascular examination reveals -normal heart sounds, regular rate and rhythm with no murmurs.  Abdomen Inspection Skin - Scar - no surgical scars. Hernias - Inguinal hernia - Left - Reducible. Palpation/Percussion Normal exam - Soft, Non Tender, No Rebound tenderness, No Rigidity (guarding) and No hepatosplenomegaly. Auscultation Normal exam - Bowel  sounds normal.  Neurologic Neurologic evaluation reveals -alert and oriented x 3 with no impairment of recent or remote memory. Mental Status-Normal.  Musculoskeletal Normal Exam - Left-Upper Extremity Strength Normal and Lower Extremity Strength Normal. Normal Exam - Right-Upper Extremity Strength Normal, Lower Extremity   Weakness.    Assessment & Plan (Ethyle Tiedt MD; 05/02/2015 2:41 PM) LEFT INGUINAL HERNIA (K40.90) Impression: 29-year-old male with a reducible left likely indirect inguinal hernia.  1. The patient will like to proceed to the operating room for laparoscopic left inguinal hernia repair.  2. I discussed with the patient the signs and symptoms of incarceration and strangulation and the need to proceed to the ER should they occur.  3. I discussed with the patient the risks and benefits of the procedure to include but not limited to: Infection, bleeding, damage to surrounding structures, possible need for further surgery, possible nerve pain, and possible recurrence. The patient was understanding and wishes to proceed. 

## 2015-09-30 ENCOUNTER — Encounter (HOSPITAL_COMMUNITY)
Admission: RE | Admit: 2015-09-30 | Discharge: 2015-09-30 | Disposition: A | Discharge status: Home / Self Care | Payer: Managed Care, Other (non HMO) | Source: Ambulatory Visit | Attending: General Surgery | Admitting: General Surgery

## 2015-09-30 ENCOUNTER — Encounter (HOSPITAL_COMMUNITY): Payer: Self-pay

## 2015-09-30 DIAGNOSIS — Z01812 Encounter for preprocedural laboratory examination: Secondary | ICD-10-CM | POA: Diagnosis not present

## 2015-09-30 DIAGNOSIS — K4090 Unilateral inguinal hernia, without obstruction or gangrene, not specified as recurrent: Secondary | ICD-10-CM | POA: Insufficient documentation

## 2015-09-30 HISTORY — DX: Unilateral inguinal hernia, without obstruction or gangrene, not specified as recurrent: K40.90

## 2015-09-30 LAB — CBC
HEMATOCRIT: 42.1 % (ref 39.0–52.0)
Hemoglobin: 14.9 g/dL (ref 13.0–17.0)
MCH: 29 pg (ref 26.0–34.0)
MCHC: 35.4 g/dL (ref 30.0–36.0)
MCV: 81.9 fL (ref 78.0–100.0)
PLATELETS: 216 10*3/uL (ref 150–400)
RBC: 5.14 MIL/uL (ref 4.22–5.81)
RDW: 14.1 % (ref 11.5–15.5)
WBC: 5.9 10*3/uL (ref 4.0–10.5)

## 2015-09-30 NOTE — Patient Instructions (Addendum)
Steve MolderDennis J Flores  09/30/2015   Your procedure is scheduled on: Friday 10-07-15  [facility]  [facility] to 3rd floor to  Short Stay Center at 9:15 AM.  Call this number if you have problems the morning of surgery [phone]   Remember: ONLY 1 PERSON MAY GO WITH YOU TO SHORT STAY TO GET  READY MORNING OF YOUR SURGERY.  Do not eat food or drink liquids :After Midnight.     Take these medicines the morning of surgery with A SIP OF WATER: NONE              You may not have any metal on your body including hair pins and              piercings  Do not wear jewelry, make-up, lotions, powders or perfumes, deodorant             Do not wear nail polish.  Do not shave  48 hours prior to surgery.              Men may shave face and neck.   Do not bring valuables to the hospital. [facility] IS NOT             RESPONSIBLE   FOR VALUABLES.  Contacts, dentures or bridgework may not be worn into surgery.  Leave suitcase in the car. After surgery it may be brought to your room.     Patients discharged the day of surgery will not be allowed to drive home.  Name and phone number of your driver:              Please read over the following fact sheets you were given: _____________________________________________________________________             [facility] - Preparing for Surgery Before surgery, you can play an important role.  Because skin is not sterile, your skin needs to be as free of germs as possible.  You can reduce the number of germs on your skin by washing with CHG (chlorahexidine gluconate) soap before surgery.  CHG is an antiseptic cleaner which kills germs and bonds with the skin to continue killing germs even after washing. Please DO NOT use if you have an allergy to CHG or antibacterial soaps.  If your skin becomes reddened/irritated stop using the CHG and inform your nurse when you arrive at Short Stay. Do not shave  (including legs and underarms) for at least 48 hours prior to the first CHG shower.  You may shave your face/neck. Please follow these instructions carefully:  1.  Shower with CHG Soap the night before surgery and the  morning of Surgery.  2.  If you choose to wash your hair, wash your hair first as usual with your  normal  shampoo.  3.  After you shampoo, rinse your hair and body thoroughly to remove the  shampoo.                           4.  Use CHG as you would any other liquid soap.  You can apply chg directly  to the skin and wash                       Gently with a scrungie or clean washcloth.  5.  Apply the CHG Soap to your body ONLY FROM THE NECK DOWN.   Do not use on face/ open                           Wound or open sores. Avoid contact with eyes, ears mouth and genitals (private parts).                       Wash face,  Genitals (private parts) with your normal soap.             6.  Wash thoroughly, paying special attention to the area where your surgery  will be performed.  7.  Thoroughly rinse your body with warm water from the neck down.  8.  DO NOT shower/wash with your normal soap after using and rinsing off  the CHG Soap.                9.  Pat yourself dry with a clean towel.            10.  Wear clean pajamas.            11.  Place clean sheets on your bed the night of your first shower and do not  sleep with pets. Day of Surgery : Do not apply any lotions/deodorants the morning of surgery.  Please wear clean clothes to the hospital/surgery center.  FAILURE TO FOLLOW THESE INSTRUCTIONS MAY RESULT IN THE CANCELLATION OF YOUR SURGERY PATIENT SIGNATURE_________________________________  NURSE SIGNATURE__________________________________  ________________________________________________________________________

## 2015-10-07 ENCOUNTER — Ambulatory Visit (HOSPITAL_COMMUNITY): Payer: Managed Care, Other (non HMO) | Admitting: Certified Registered Nurse Anesthetist

## 2015-10-07 ENCOUNTER — Encounter (HOSPITAL_COMMUNITY)
Admission: RE | Disposition: A | Discharge status: Home / Self Care | Payer: Self-pay | Source: Ambulatory Visit | Attending: General Surgery

## 2015-10-07 ENCOUNTER — Ambulatory Visit (HOSPITAL_COMMUNITY)
Admission: RE | Admit: 2015-10-07 | Discharge: 2015-10-07 | Disposition: A | Discharge status: Home / Self Care | Payer: Managed Care, Other (non HMO) | Source: Ambulatory Visit | Attending: General Surgery | Admitting: General Surgery

## 2015-10-07 ENCOUNTER — Encounter (HOSPITAL_COMMUNITY): Payer: Self-pay | Admitting: *Deleted

## 2015-10-07 DIAGNOSIS — K4090 Unilateral inguinal hernia, without obstruction or gangrene, not specified as recurrent: Secondary | ICD-10-CM | POA: Insufficient documentation

## 2015-10-07 HISTORY — PX: INSERTION OF MESH: SHX5868

## 2015-10-07 HISTORY — PX: INGUINAL HERNIA REPAIR: SHX194

## 2015-10-07 SURGERY — REPAIR, HERNIA, INGUINAL, LAPAROSCOPIC
Anesthesia: General | Site: Abdomen | Laterality: Left

## 2015-10-07 MED ORDER — FENTANYL CITRATE (PF) 250 MCG/5ML IJ SOLN
INTRAMUSCULAR | Status: AC
  Filled 2015-10-07: qty 5

## 2015-10-07 MED ORDER — ROCURONIUM BROMIDE 100 MG/10ML IV SOLN
INTRAVENOUS | Status: DC | PRN
  Administered 2015-10-07: 20 mg via INTRAVENOUS
  Administered 2015-10-07: 5 mg via INTRAVENOUS

## 2015-10-07 MED ORDER — LIDOCAINE HCL (CARDIAC) 20 MG/ML IV SOLN
INTRAVENOUS | Status: DC | PRN
  Administered 2015-10-07: 100 mg via INTRAVENOUS

## 2015-10-07 MED ORDER — ONDANSETRON HCL 4 MG/2ML IJ SOLN
INTRAMUSCULAR | Status: AC
  Filled 2015-10-07: qty 4

## 2015-10-07 MED ORDER — LACTATED RINGERS IV SOLN
INTRAVENOUS | Status: DC
  Administered 2015-10-07: 12:00:00 via INTRAVENOUS
  Administered 2015-10-07: 1000 mL via INTRAVENOUS

## 2015-10-07 MED ORDER — ONDANSETRON HCL 4 MG/2ML IJ SOLN
INTRAMUSCULAR | Status: DC | PRN
Start: 2015-10-07 — End: 2015-10-07
  Administered 2015-10-07: 4 mg via INTRAVENOUS

## 2015-10-07 MED ORDER — DEXAMETHASONE SODIUM PHOSPHATE 10 MG/ML IJ SOLN
INTRAMUSCULAR | Status: AC
  Filled 2015-10-07: qty 1

## 2015-10-07 MED ORDER — 0.9 % SODIUM CHLORIDE (POUR BTL) OPTIME
TOPICAL | Status: DC | PRN
  Administered 2015-10-07: 1000 mL

## 2015-10-07 MED ORDER — DEXAMETHASONE SODIUM PHOSPHATE 10 MG/ML IJ SOLN
INTRAMUSCULAR | Status: DC | PRN
  Administered 2015-10-07: 10 mg via INTRAVENOUS

## 2015-10-07 MED ORDER — ONDANSETRON HCL 4 MG/2ML IJ SOLN: 4.0000 mg | Freq: Once | INTRAMUSCULAR | Status: DC | PRN

## 2015-10-07 MED ORDER — FENTANYL CITRATE (PF) 100 MCG/2ML IJ SOLN: 25.0000 ug | INTRAMUSCULAR | Status: DC | PRN

## 2015-10-07 MED ORDER — BUPIVACAINE-EPINEPHRINE (PF) 0.25% -1:200000 IJ SOLN
INTRAMUSCULAR | Status: AC
  Filled 2015-10-07: qty 30

## 2015-10-07 MED ORDER — ROCURONIUM BROMIDE 100 MG/10ML IV SOLN
INTRAVENOUS | Status: AC
  Filled 2015-10-07: qty 1

## 2015-10-07 MED ORDER — PROPOFOL 10 MG/ML IV BOLUS
INTRAVENOUS | Status: DC | PRN
  Administered 2015-10-07: 200 mg via INTRAVENOUS

## 2015-10-07 MED ORDER — MIDAZOLAM HCL 2 MG/2ML IJ SOLN
INTRAMUSCULAR | Status: AC
  Filled 2015-10-07: qty 6

## 2015-10-07 MED ORDER — SUCCINYLCHOLINE CHLORIDE 20 MG/ML IJ SOLN
INTRAMUSCULAR | Status: DC | PRN
  Administered 2015-10-07: 100 mg via INTRAVENOUS

## 2015-10-07 MED ORDER — SUGAMMADEX SODIUM 200 MG/2ML IV SOLN
INTRAVENOUS | Status: DC | PRN
  Administered 2015-10-07: 200 mg via INTRAVENOUS

## 2015-10-07 MED ORDER — OXYCODONE-ACETAMINOPHEN 5-325 MG PO TABS: 1.0000 | ORAL_TABLET | ORAL | Status: DC | PRN

## 2015-10-07 MED ORDER — MIDAZOLAM HCL 5 MG/5ML IJ SOLN
INTRAMUSCULAR | Status: DC | PRN
  Administered 2015-10-07: 2 mg via INTRAVENOUS

## 2015-10-07 MED ORDER — FENTANYL CITRATE (PF) 100 MCG/2ML IJ SOLN
INTRAMUSCULAR | Status: DC | PRN
  Administered 2015-10-07 (×2): 50 ug via INTRAVENOUS
  Administered 2015-10-07: 100 ug via INTRAVENOUS
  Administered 2015-10-07: 50 ug via INTRAVENOUS

## 2015-10-07 MED ORDER — LIDOCAINE HCL (CARDIAC) 20 MG/ML IV SOLN
INTRAVENOUS | Status: AC
  Filled 2015-10-07: qty 5

## 2015-10-07 MED ORDER — CEFAZOLIN SODIUM-DEXTROSE 2-3 GM-% IV SOLR
INTRAVENOUS | Status: AC
  Filled 2015-10-07: qty 50

## 2015-10-07 MED ORDER — BUPIVACAINE-EPINEPHRINE 0.25% -1:200000 IJ SOLN
INTRAMUSCULAR | Status: DC | PRN
  Administered 2015-10-07: 8 mL

## 2015-10-07 MED ORDER — SUGAMMADEX SODIUM 200 MG/2ML IV SOLN
INTRAVENOUS | Status: AC
  Filled 2015-10-07: qty 2

## 2015-10-07 MED ORDER — CEFAZOLIN SODIUM-DEXTROSE 2-3 GM-% IV SOLR
2.0000 g | INTRAVENOUS | Status: AC
  Administered 2015-10-07: 2 g via INTRAVENOUS

## 2015-10-07 MED ORDER — HYDROMORPHONE HCL 1 MG/ML IJ SOLN
INTRAMUSCULAR | Status: DC | PRN
  Administered 2015-10-07: .2 mg via INTRAVENOUS

## 2015-10-07 MED ORDER — HYDROMORPHONE HCL 2 MG/ML IJ SOLN
INTRAMUSCULAR | Status: AC
  Filled 2015-10-07: qty 1

## 2015-10-07 MED ORDER — PROPOFOL 10 MG/ML IV BOLUS
INTRAVENOUS | Status: AC
  Filled 2015-10-07: qty 20

## 2015-10-07 MED ORDER — CHLORHEXIDINE GLUCONATE 4 % EX LIQD: 1.0000 "application " | Freq: Once | CUTANEOUS | Status: DC

## 2015-10-07 SURGICAL SUPPLY — 45 items
APL SKNCLS STERI-STRIP NONHPOA (GAUZE/BANDAGES/DRESSINGS)
APPLIER CLIP 5 13 M/L LIGAMAX5 (MISCELLANEOUS)
APR CLP MED LRG 5 ANG JAW (MISCELLANEOUS)
BAG URINE DRAINAGE (UROLOGICAL SUPPLIES) ×3 IMPLANT
BENZOIN TINCTURE PRP APPL 2/3 (GAUZE/BANDAGES/DRESSINGS) ×1 IMPLANT
CABLE HIGH FREQUENCY MONO STRZ (ELECTRODE) ×2 IMPLANT
CATH FOLEY 3WAY 30CC 16FR (CATHETERS) ×3 IMPLANT
CHLORAPREP W/TINT 26ML (MISCELLANEOUS) ×3 IMPLANT
CLIP APPLIE 5 13 M/L LIGAMAX5 (MISCELLANEOUS) ×1 IMPLANT
CLOSURE WOUND 1/2 X4 (GAUZE/BANDAGES/DRESSINGS) ×1
COVER SURGICAL LIGHT HANDLE (MISCELLANEOUS) ×3 IMPLANT
DECANTER SPIKE VIAL GLASS SM (MISCELLANEOUS) ×1 IMPLANT
ELECT REM PT RETURN 9FT ADLT (ELECTROSURGICAL) ×3
ELECTRODE REM PT RTRN 9FT ADLT (ELECTROSURGICAL) ×1 IMPLANT
ENDOLOOP SUT PDS II  0 18 (SUTURE) ×2
ENDOLOOP SUT PDS II 0 18 (SUTURE) ×1 IMPLANT
GAUZE SPONGE 2X2 8PLY STRL LF (GAUZE/BANDAGES/DRESSINGS) ×1 IMPLANT
GLOVE BIO SURGEON STRL SZ7.5 (GLOVE) ×3 IMPLANT
GOWN STRL REUS W/TWL XL LVL3 (GOWN DISPOSABLE) ×10 IMPLANT
KIT BASIN OR (CUSTOM PROCEDURE TRAY) ×3 IMPLANT
MESH 3DMAX 4X6 LT LRG (Mesh General) ×2 IMPLANT
NDL INSUFFLATION 14GA 120MM (NEEDLE) IMPLANT
NEEDLE INSUFFLATION 14GA 120MM (NEEDLE) ×3 IMPLANT
PAD POSITIONING PINK XL (MISCELLANEOUS) IMPLANT
PLUG CATH AND CAP STER (CATHETERS) ×3 IMPLANT
POSITIONER SURGICAL ARM (MISCELLANEOUS) IMPLANT
RELOAD STAPLE 4.0 BLU F/HERNIA (INSTRUMENTS) ×1 IMPLANT
RELOAD STAPLE 4.8 BLK F/HERNIA (STAPLE) IMPLANT
RELOAD STAPLE HERNIA 4.0 BLUE (INSTRUMENTS) ×3 IMPLANT
RELOAD STAPLE HERNIA 4.8 BLK (STAPLE) IMPLANT
SCISSORS LAP 5X35 DISP (ENDOMECHANICALS) ×2 IMPLANT
SET IRRIG TUBING LAPAROSCOPIC (IRRIGATION / IRRIGATOR) IMPLANT
SET IRRIG Y TYPE TUR BLADDER L (SET/KITS/TRAYS/PACK) ×3 IMPLANT
SPONGE GAUZE 2X2 STER 10/PKG (GAUZE/BANDAGES/DRESSINGS) ×2
STAPLER HERNIA 12 8.5 360D (INSTRUMENTS) ×3 IMPLANT
STRIP CLOSURE SKIN 1/2X4 (GAUZE/BANDAGES/DRESSINGS) ×2 IMPLANT
SUT MNCRL AB 4-0 PS2 18 (SUTURE) ×3 IMPLANT
TAPE CLOTH 4X10 WHT NS (GAUZE/BANDAGES/DRESSINGS) IMPLANT
TOWEL OR 17X26 10 PK STRL BLUE (TOWEL DISPOSABLE) ×3 IMPLANT
TOWEL OR NON WOVEN STRL DISP B (DISPOSABLE) ×3 IMPLANT
TRAY FOLEY W/METER SILVER 14FR (SET/KITS/TRAYS/PACK) ×1 IMPLANT
TRAY FOLEY W/METER SILVER 16FR (SET/KITS/TRAYS/PACK) ×3 IMPLANT
TRAY LAPAROSCOPIC (CUSTOM PROCEDURE TRAY) ×3 IMPLANT
TROCAR CANNULA W/PORT DUAL 5MM (MISCELLANEOUS) ×3 IMPLANT
TROCAR XCEL 12X100 BLDLESS (ENDOMECHANICALS) ×3 IMPLANT

## 2015-10-07 NOTE — Discharge Instructions (Signed)
[facility] _______[facility] ° °INGUINAL HERNIA REPAIR: POST OP INSTRUCTIONS ° °Always review your discharge instruction sheet given to you by the facility where your surgery was performed. °IF YOU HAVE DISABILITY OR FAMILY LEAVE FORMS, YOU MUST BRING THEM TO THE OFFICE FOR PROCESSING.   °DO NOT GIVE THEM TO YOUR DOCTOR. ° °1. A  prescription for pain medication may be given to you upon discharge.  Take your pain medication as prescribed, if needed.  If narcotic pain medicine is not needed, then you may take acetaminophen (Tylenol) or ibuprofen (Advil) as needed. °2. Take your usually prescribed medications unless otherwise directed. °3. If you need a refill on your pain medication, please contact your pharmacy.  They will contact our office to request authorization. Prescriptions will not be filled after 5 pm or on week-ends. °4. You should follow a light diet the first 24 hours after arrival home, such as soup and crackers, etc.  Be sure to include lots of fluids daily.  Resume your normal diet the day after surgery. °5. Most patients will experience some swelling and bruising around the umbilicus or in the groin and scrotum.  Ice packs and reclining will help.  Swelling and bruising can take several days to resolve.  °6. It is common to experience some constipation if taking pain medication after surgery.  Increasing fluid intake and taking a stool softener (such as Colace) will usually help or prevent this problem from occurring.  A mild laxative (Milk of Magnesia or Miralax) should be taken according to package directions if there are no bowel movements after 48 hours. °7. Unless discharge instructions indicate otherwise, you may remove your bandages 24-48 hours after surgery, and you may shower at that time.  You may have steri-strips (small skin tapes) in place directly over the incision.  These strips should be left on the skin for 7-10 days.  If your surgeon used skin glue on the incision, you  may shower in 24 hours.  The glue will flake off over the next 2-3 weeks.  Any sutures or staples will be removed at the office during your follow-up visit. °8. ACTIVITIES:  You may resume regular (light) daily activities beginning the next day--such as daily self-care, walking, climbing stairs--gradually increasing activities as tolerated.  You may have sexual intercourse when it is comfortable.  Refrain from any heavy lifting or straining until approved by your doctor. °a. You may drive when you are no longer taking prescription pain medication, you can comfortably wear a seatbelt, and you can safely maneuver your car and apply brakes. °b. RETURN TO WORK:  __________________________________________________________ °9. You should see your doctor in the office for a follow-up appointment approximately 2-3 weeks after your surgery.  Make sure that you call for this appointment within a day or two after you arrive home to insure a convenient appointment time. °10. OTHER INSTRUCTIONS:  __________________________________________________________________________________________________________________________________________________________________________________________  °WHEN TO CALL YOUR DOCTOR: °1. Fever over 101.0 °2. Inability to urinate °3. Nausea and/or vomiting °4. Extreme swelling or bruising °5. Continued bleeding from incision. °6. Increased pain, redness, or drainage from the incision ° °The clinic staff is available to answer your questions during regular business hours.  Please don’t hesitate to call and ask to speak to one of the nurses for clinical concerns.  If you have a medical emergency, go to the nearest emergency room or call 911.  A surgeon from [facility] is always on call at the hospital ° ° °[address], [address], [place], [place]  [address] ? ° [address] [address], [place], [place]   [address] °[phone] ? [phone] ? FAX [phone] °Web site:  [email or web address] ° ° ° ° °General Anesthesia, Adult, Care After °Refer to this sheet in the next few weeks. These instructions provide you with information on caring for yourself after your procedure. Your health care provider may also give you more specific instructions. Your treatment has been planned according to current medical practices, but problems sometimes occur. Call your health care provider if you have any problems or questions after your procedure. °WHAT TO EXPECT AFTER THE PROCEDURE °After the procedure, it is typical to experience: °· Sleepiness. °· Nausea and vomiting. °HOME CARE INSTRUCTIONS °· For the first 24 hours after general anesthesia: °¨ Have a responsible person with you. °¨ Do not drive a car. If you are alone, do not take public transportation. °¨ Do not drink alcohol. °¨ Do not take medicine that has not been prescribed by your health care provider. °¨ Do not sign important papers or make important decisions. °¨ You may resume a normal diet and activities as directed by your health care provider. °· Change bandages (dressings) as directed. °· If you have questions or problems that seem related to general anesthesia, call the hospital and ask for the anesthetist or anesthesiologist on call. °SEEK MEDICAL CARE IF: °· You have nausea and vomiting that continue the day after anesthesia. °· You develop a rash. °SEEK IMMEDIATE MEDICAL CARE IF:  °· You have difficulty breathing. °· You have chest pain. °· You have any allergic problems. °  °This information is not intended to replace advice given to you by your health care provider. Make sure you discuss any questions you have with your health care provider. °  °Document Released: 10/15/2000 Document Revised: 07/30/2014 Document Reviewed: 11/07/2011 °Elsevier Interactive Patient Education ©2016 [employer] ° °

## 2015-10-07 NOTE — Anesthesia Postprocedure Evaluation (Signed)
Anesthesia Post Note  Patient: Steve MolderDennis J Flores  Procedure(s) Performed: Procedure(s) (LRB): LAPAROSCOPIC LEFT  INGUINAL HERNIA (Left) INSERTION OF MESH (Left)  Patient location during evaluation: PACU Anesthesia Type: General Level of consciousness: awake and alert Pain management: pain level controlled Vital Signs Assessment: post-procedure vital signs reviewed and stable Respiratory status: spontaneous breathing, nonlabored ventilation, respiratory function stable and patient connected to nasal cannula oxygen Cardiovascular status: blood pressure returned to baseline and stable Postop Assessment: no signs of nausea or vomiting Anesthetic complications: no    Last Vitals:  Filed Vitals:   10/07/15 1300 10/07/15 1309  BP: 135/101 144/88  Pulse: 73 73  Temp: 36.4 C 36.3 C  Resp: 13 14    Last Pain:  Filed Vitals:   10/07/15 1312  PainSc: 0-No pain                 Conny Moening JENNETTE

## 2015-10-07 NOTE — Op Note (Signed)
10/07/2015  12:14 PM  PATIENT:  Steve Flores  30 y.o. male  PRE-OPERATIVE DIAGNOSIS:  Left inguinal hernia  POST-OPERATIVE DIAGNOSIS:  Left direct inguinal hernia  PROCEDURE:  Procedure(s): LAPAROSCOPIC LEFT  INGUINAL HERNIA (Left) INSERTION OF MESH (Left)  SURGEON:  Surgeon(s) and Role:    * Axel FillerArmando Ki Corbo, MD - Primary   ANESTHESIA:   local and general  EBL:<5cc   Total I/O In: -  Out: 200 [Urine:200]  BLOOD ADMINISTERED:none  DRAINS: none   LOCAL MEDICATIONS USED:  BUPIVICAINE   SPECIMEN:  No Specimen  DISPOSITION OF SPECIMEN:  N/A  COUNTS:  YES  TOURNIQUET:  * No tourniquets in log *  DICTATION: .Dragon Dictation   Counts: reported as correct x 2  Findings:  The patient had a small left indirect hernia  Indications for procedure:  The patient is a 30 year old male with a left inguinal hernia for several months. Patient complained of symptomatology to his left inguinal area. The patient was taken back for elective inguinal hernia repair.  Details of the procedure: The patient was taken back to the operating room. The patient was placed in supine position with bilateral SCDs in place.  The patient was prepped and draped in the usual sterile fashion.  After appropriate anitbiotics were confirmed, a time-out was confirmed and all facts were verified.  0.25% Marcaine was used to infiltrate the umbilical area. A 11-blade was used to cut down the skin and blunt dissection was used to get the anterior fashion.  The anterior fascia was incised approximately 1 cm and the muscles were retracted laterally. Blunt dissection was then used to create a space in the preperitoneal area. At this time a 10 mm camera was then introduced into the space and advanced the pubic tubercle and a 12 mm trocar was placed over this and insufflation was started.  At this time and space was created from medial to laterally the preperitoneal space.  Cooper's ligament was initially cleaned off.   The hernia sac was identified in the indirect space. Dissection of the hernia sac was undertaken the vas deferens was identified and protected in all parts of the case.    Once the hernia sac was taken down to approximately the umbilicus a Bard 3D Max mesh, size: Large, was  introduced into the preperitoneal space.  The mesh was brought over to cover the direct and indirect hernia spaces.  This was anchored into place and secured to Cooper's ligament with 4.710mm staples from a Coviden hernia stapler. It was anchored to the anterior abdominal wall with 4.8 mm staples. The hernia sac was seen lying posterior to the mesh. There was no staples placed laterally. The insufflation was evacuated and the peritoneum was seen posterior to the mesh. The trochars were removed. The anterior fascia was reapproximated using #1 Vicryl on a UR- 6. The skin was reapproximated using 4-0 Monocryl subcuticular fashion the patient was awakened from general anesthesia and taken to recovery in stable condition.   PLAN OF CARE: Discharge to home after PACU  PATIENT DISPOSITION:  PACU - hemodynamically stable.   Delay start of Pharmacological VTE agent (>24hrs) due to surgical blood loss or risk of bleeding: not applicable

## 2015-10-07 NOTE — Transfer of Care (Signed)
Immediate Anesthesia Transfer of Care Note  Patient: Steve MolderDennis J Flores  Procedure(s) Performed: Procedure(s): LAPAROSCOPIC LEFT  INGUINAL HERNIA (Left) INSERTION OF MESH (Left)  Patient Location: PACU  Anesthesia Type:General  Level of Consciousness:  sedated, patient cooperative and responds to stimulation  Airway & Oxygen Therapy:Patient Spontanous Breathing and Patient connected to face mask oxgen  Post-op Assessment:  Report given to PACU RN and Post -op Vital signs reviewed and stable  Post vital signs:  Reviewed and stable  Last Vitals:  Filed Vitals:   10/07/15 0859 10/07/15 1238  BP: 116/70 134/88  Pulse: 73 90  Temp: 36.6 C 36.5 C  Resp: 16 13    Complications: No apparent anesthesia complications

## 2015-10-07 NOTE — Anesthesia Preprocedure Evaluation (Signed)
Anesthesia Evaluation  Patient identified by MRN, date of birth, ID band Patient awake    Reviewed: Allergy & Precautions, NPO status , Patient's Chart, lab work & pertinent test results  History of Anesthesia Complications Negative for: history of anesthetic complications  Airway Mallampati: III  TM Distance: >3 FB Neck ROM: Full    Dental no notable dental hx. (+) Dental Advisory Given   Pulmonary neg pulmonary ROS,    Pulmonary exam normal breath sounds clear to auscultation       Cardiovascular negative cardio ROS Normal cardiovascular exam Rhythm:Regular Rate:Normal     Neuro/Psych negative neurological ROS  negative psych ROS   GI/Hepatic negative GI ROS, Neg liver ROS,   Endo/Other  negative endocrine ROS  Renal/GU negative Renal ROS  negative genitourinary   Musculoskeletal negative musculoskeletal ROS (+)   Abdominal   Peds negative pediatric ROS (+)  Hematology negative hematology ROS (+)   Anesthesia Other Findings   Reproductive/Obstetrics negative OB ROS                             Anesthesia Physical Anesthesia Plan  ASA: I  Anesthesia Plan: General   Post-op Pain Management:    Induction: Intravenous  Airway Management Planned: Oral ETT  Additional Equipment:   Intra-op Plan:   Post-operative Plan: Extubation in OR  Informed Consent: I have reviewed the patients History and Physical, chart, labs and discussed the procedure including the risks, benefits and alternatives for the proposed anesthesia with the patient or authorized representative who has indicated his/her understanding and acceptance.   Dental advisory given  Plan Discussed with: CRNA  Anesthesia Plan Comments:         Anesthesia Quick Evaluation

## 2015-10-07 NOTE — Interval H&P Note (Signed)
History and Physical Interval Note:  10/07/2015 10:00 AM  Steve Flores  has presented today for surgery, with the diagnosis of Left inguinal hernia  The various methods of treatment have been discussed with the patient and family. After consideration of risks, benefits and other options for treatment, the patient has consented to  Procedure(s): LAPAROSCOPIC LEFT  INGUINAL HERNIA (Left) INSERTION OF MESH (Left) as a surgical intervention .  The patient's history has been reviewed, patient examined, no change in status, stable for surgery.  I have reviewed the patient's chart and labs.  Questions were answered to the patient's satisfaction.     Marigene Ehlersamirez Jr., Jed LimerickArmando

## 2015-10-07 NOTE — H&P (View-Only) (Signed)
History of Present Illness Steve Filler MD; 05/02/2015 2:41 PM) Patient words: Evaluate inguinal hernia.  The patient is a 30 year old male who presents with an inguinal hernia. The patient is a 30 year old male who is referred by [facility] for evaluation of a left inguinal hernia. The patient states he suffered this approximately a month ago. He states that he was at work been over a left kidney and pulling something and felt a pain to the left inguinal area. He states that he noticed a bulge thereafter. The patient is had no signs or symptoms of incarceration or strangulation patient.   Other Problems Steve Flores, CMA; 05/02/2015 2:07 PM) No pertinent past medical history  Past Surgical History Steve Flores, CMA; 05/02/2015 2:07 PM) No pertinent past surgical history  Diagnostic Studies History Steve Flores, [place]; 05/02/2015 2:07 PM) Colonoscopy never  Allergies Steve Flores, CMA; 05/02/2015 2:07 PM) No Known Drug Allergies10/04/2015  Medication History Steve Flores, CMA; 05/02/2015 2:08 PM) Reglan (  Tablet, Oral) Active. Ibuprofen (  Tablet, Oral) Active. Medications Reconciled  Social History Steve Flores, [place]; 05/02/2015 2:07 PM) Alcohol use Occasional alcohol use. No caffeine use No drug use Tobacco use Never smoker.  Family History Steve Flores, [place]; 05/02/2015 2:07 PM) Breast Cancer Mother.    Review of Systems Steve Filler MD; 05/02/2015 2:40 PM) General Not Present- Appetite Loss, Chills, Fatigue, Fever, Night Sweats, Weight Gain and Weight Loss. Skin Not Present- Change in Wart/Mole, Dryness, Hives, Jaundice, New Lesions, Non-Healing Wounds, Rash and Ulcer. HEENT Not Present- Earache, Hearing Loss, Hoarseness, Nose Bleed, Oral Ulcers, Ringing in the Ears, Seasonal Allergies, Sinus Pain, Sore Throat, Visual Disturbances, Wears glasses/contact lenses and Yellow Eyes. Respiratory Not Present- Bloody sputum, Chronic Cough,  Difficulty Breathing, Snoring and Wheezing. Cardiovascular Not Present- Chest Pain, Difficulty Breathing Lying Down, Leg Cramps, Palpitations, Rapid Heart Rate, Shortness of Breath and Swelling of Extremities. Gastrointestinal Present- Abdominal Pain. Not Present- Bloating, Bloody Stool, Change in Bowel Habits, Chronic diarrhea, Constipation, Difficulty Swallowing, Excessive gas, Gets full quickly at meals, Hemorrhoids, Indigestion, Nausea, Rectal Pain and Vomiting. Musculoskeletal Present- Swelling of Extremities. Not Present- Back Pain, Joint Pain, Joint Stiffness, Muscle Pain and Muscle Weakness. Neurological Not Present- Weakness. Psychiatric Not Present- Anxiety, Bipolar, Change in Sleep Pattern, Depression, Fearful and Frequent crying.  Vitals Steve Flores CMA; 05/02/2015 2:07 PM) 05/02/2015 2:07 PM Weight: 169.8 lb Temp.: 98.53F(Temporal)  Pulse: 66 (Regular)  Resp.: 16 (Unlabored)  BP: 118/76 (Sitting, Left Arm, Standard)     Physical Exam Steve Filler, MD; 05/02/2015 2:40 00) General Mental Status-Alert. General Appearance-Consistent with stated age. Hydration-Well hydrated. Voice-Normal.  Head and Neck Head-normocephalic, atraumatic with no lesions or palpable masses. Trachea-midline.  Eye Eyeball - Bilateral-Extraocular movements intact. Sclera/Conjunctiva - Bilateral-No scleral icterus.  Chest and Lung Exam Chest and lung exam reveals -quiet, even and easy respiratory effort with no use of accessory muscles. Inspection Chest Wall - Normal. Back - normal.  Cardiovascular Cardiovascular examination reveals -normal heart sounds, regular rate and rhythm with no murmurs.  Abdomen Inspection Skin - Scar - no surgical scars. Hernias - Inguinal hernia - Left - Reducible. Palpation/Percussion Normal exam - Soft, Non Tender, No Rebound tenderness, No Rigidity (guarding) and No hepatosplenomegaly. Auscultation Normal exam - Bowel  sounds normal.  Neurologic Neurologic evaluation reveals -alert and oriented x 3 with no impairment of recent or remote memory. Mental Status-Normal.  Musculoskeletal Normal Exam - Left-Upper Extremity Strength Normal and Lower Extremity Strength Normal. Normal Exam - Right-Upper Extremity Strength Normal, Lower Extremity  Weakness.    Assessment & Plan Steve Flores(Steve Justiniano MD; 05/02/2015 2:41 PM) LEFT INGUINAL HERNIA (K40.90) Impression: 30 year old male with a reducible left likely indirect inguinal hernia.  1. The patient will like to proceed to the operating room for laparoscopic left inguinal hernia repair.  2. I discussed with the patient the signs and symptoms of incarceration and strangulation and the need to proceed to the ER should they occur.  3. I discussed with the patient the risks and benefits of the procedure to include but not limited to: Infection, bleeding, damage to surrounding structures, possible need for further surgery, possible nerve pain, and possible recurrence. The patient was understanding and wishes to proceed.

## 2015-10-07 NOTE — Anesthesia Procedure Notes (Signed)
Procedure Name: Intubation Date/Time: 10/07/2015 11:24 AM Performed by: Wynonia SoursWALKER, Hadasah Brugger L Pre-anesthesia Checklist: Patient identified, Emergency Drugs available, Suction available, Patient being monitored and Timeout performed Patient Re-evaluated:Patient Re-evaluated prior to inductionOxygen Delivery Method: Circle system utilized Preoxygenation: Pre-oxygenation with 100% oxygen Intubation Type: IV induction Ventilation: Mask ventilation without difficulty Laryngoscope Size: Mac and 4 Grade View: Grade III Tube type: Oral Tube size: 7.5 mm Number of attempts: 2 Airway Equipment and Method: Bougie stylet Placement Confirmation: ETT inserted through vocal cords under direct vision,  positive ETCO2,  CO2 detector and breath sounds checked- equal and bilateral Secured at: 24 cm Tube secured with: Tape Dental Injury: Teeth and Oropharynx as per pre-operative assessment  Difficulty Due To: Difficult Airway- due to anterior larynx Comments: Grade 3 view with MAC 4 with DL utilized bougie for second DL and successful placement of 7.5 ETT.

## 2017-01-21 ENCOUNTER — Encounter (HOSPITAL_COMMUNITY): Payer: Self-pay | Admitting: Emergency Medicine

## 2017-01-21 ENCOUNTER — Emergency Department (HOSPITAL_COMMUNITY)
Admission: EM | Admit: 2017-01-21 | Discharge: 2017-01-21 | Disposition: A | Discharge status: Home / Self Care | Payer: Managed Care, Other (non HMO) | Attending: Emergency Medicine | Admitting: Emergency Medicine

## 2017-01-21 DIAGNOSIS — K2100 Gastro-esophageal reflux disease with esophagitis, without bleeding: Secondary | ICD-10-CM

## 2017-01-21 LAB — CBC WITH DIFFERENTIAL/PLATELET
BASOS PCT: 0 %
Basophils Absolute: 0 10*3/uL (ref 0.0–0.1)
Eosinophils Absolute: 0.1 10*3/uL (ref 0.0–0.7)
Eosinophils Relative: 1 %
HEMATOCRIT: 38.9 % — AB (ref 39.0–52.0)
HEMOGLOBIN: 14.2 g/dL (ref 13.0–17.0)
Lymphocytes Relative: 17 %
Lymphs Abs: 1.3 10*3/uL (ref 0.7–4.0)
MCH: 28.6 pg (ref 26.0–34.0)
MCHC: 36.5 g/dL — ABNORMAL HIGH (ref 30.0–36.0)
MCV: 78.4 fL (ref 78.0–100.0)
Monocytes Absolute: 1.2 10*3/uL — ABNORMAL HIGH (ref 0.1–1.0)
Monocytes Relative: 16 %
NEUTROS ABS: 5.1 10*3/uL (ref 1.7–7.7)
NEUTROS PCT: 66 %
Platelets: 192 10*3/uL (ref 150–400)
RBC: 4.96 MIL/uL (ref 4.22–5.81)
RDW: 14.2 % (ref 11.5–15.5)
WBC: 7.8 10*3/uL (ref 4.0–10.5)

## 2017-01-21 LAB — COMPREHENSIVE METABOLIC PANEL
ALBUMIN: 3.9 g/dL (ref 3.5–5.0)
ALK PHOS: 52 U/L (ref 38–126)
ALT: 14 U/L — AB (ref 17–63)
AST: 20 U/L (ref 15–41)
Anion gap: 9 (ref 5–15)
BILIRUBIN TOTAL: 0.6 mg/dL (ref 0.3–1.2)
BUN: 14 mg/dL (ref 6–20)
CALCIUM: 9.1 mg/dL (ref 8.9–10.3)
CO2: 26 mmol/L (ref 22–32)
Chloride: 103 mmol/L (ref 101–111)
Creatinine, Ser: 1.37 mg/dL — ABNORMAL HIGH (ref 0.61–1.24)
GFR calc Af Amer: >60 mL/min (ref >60)
GFR calc non Af Amer: >60 mL/min (ref >60)
GLUCOSE: 85 mg/dL (ref 65–99)
Potassium: 3.8 mmol/L (ref 3.5–5.1)
Sodium: 138 mmol/L (ref 135–145)
TOTAL PROTEIN: 7.8 g/dL (ref 6.5–8.1)

## 2017-01-21 LAB — LIPASE, BLOOD: Lipase: 21 U/L (ref 11–51)

## 2017-01-21 LAB — RAPID STREP SCREEN (MED CTR MEBANE ONLY): Streptococcus, Group A Screen (Direct): NEGATIVE

## 2017-01-21 MED ORDER — SODIUM CHLORIDE 0.9 % IV BOLUS (SEPSIS): 1000.0000 mL | Freq: Once | INTRAVENOUS | Status: DC

## 2017-01-21 MED ORDER — OMEPRAZOLE 20 MG PO CPDR: 20.0000 mg | DELAYED_RELEASE_CAPSULE | Freq: Two times a day (BID) | ORAL | 1 refills | Status: DC

## 2017-01-21 MED ORDER — GI COCKTAIL ~~LOC~~
30.0000 mL | Freq: Once | ORAL | Status: AC
  Administered 2017-01-21: 30 mL via ORAL
  Filled 2017-01-21: qty 30

## 2017-01-21 MED ORDER — SODIUM CHLORIDE 0.9 % IV BOLUS (SEPSIS)
1000.0000 mL | Freq: Once | INTRAVENOUS | Status: AC
  Administered 2017-01-21: 1000 mL via INTRAVENOUS

## 2017-01-21 MED ORDER — SUCRALFATE 1 G PO TABS: 1.0000 g | ORAL_TABLET | Freq: Four times a day (QID) | ORAL | 0 refills | Status: DC

## 2017-01-21 MED ORDER — ACETAMINOPHEN 325 MG PO TABS: 650.0000 mg | ORAL_TABLET | Freq: Once | ORAL | Status: DC

## 2017-01-21 MED ORDER — PANTOPRAZOLE SODIUM 40 MG IV SOLR
40.0000 mg | Freq: Once | INTRAVENOUS | Status: AC
  Administered 2017-01-21: 40 mg via INTRAVENOUS
  Filled 2017-01-21: qty 40

## 2017-01-21 NOTE — Discharge Instructions (Addendum)
Avoid alcohol, anti-infmmalatory medication/NSAIDs Small frequent meals. Call [facility] for follow up appointment.

## 2017-01-21 NOTE — ED Notes (Signed)
Pt was triaged for sore throat and fever, now complaints of sharp RUQ pain. Trinna PostAlex, PA was informed, assessed pt. Increased pt's level of care, will be moved to different room. Report given to Fleet Contrasachel,  [place]

## 2017-01-21 NOTE — ED Notes (Signed)
Bed: WA27 Expected date:  Expected time:  Means of arrival:  Comments: TRIAGE 3

## 2017-01-21 NOTE — ED Provider Notes (Signed)
MSE was initiated and I personally evaluated the patient and placed orders (if any) at  4:44 PM on January 21, 2017.  Steve Flores is a 31 y.o. male who presents to the Emergency Department complaining of sore throat onset 2 days ago. Pt reports associated painful swallowing, HA, fatigue, nausea, RUQ and epigastric abdominal pain, burning sensation to upper abdomen, and decreased PO intake due to sore throat. Pt has tried zantac with no relief of his symptoms. He denies nasal congestion, cough, fever, vomiting, trouble swallowing, ear pain, and any other symptoms.   Rapid strep is pending, but patient seems to be very concerned about his chest and epigastric pain that he describes as indigestion. He is also concerned about dehydration considering he hasn't been able to drink very much due to painful swallowing. I feel patient would benefit from labs and possible RUQ US considering epigastric and right upper quadrant tenderness.  The patient appears stable so that the remainder of the MSE may be completed by another provider.   Emi HolesLaw, Steve Strom M, PA-C 01/21/17 1646    Rolland PorterJames, Mark, MD 01/30/17 [phone]

## 2017-01-21 NOTE — ED Provider Notes (Signed)
[facility] Provider Note   CSN: [id number] Arrival date & time: 01/21/17  1254     History   Chief Complaint Chief Complaint  Patient presents with  . Sore Throat  . hot and cold spells    HPI Steve Flores is a 31 y.o. male. Chief complaint is chest pain with swallowing  HPI: This is a 31 year old male who states for the last 3 days he is "not been able to eat anything". He states that he is able to eat and drink and that it goes down just that it is "really painful. The incidence of swallowing swish water in his mouth because it "feels really dry and it makes it sore". His temperature has been 99 at home. He is not regurgitating. Pain starts in his low substernal chest and epigastrium and radiates to the base of his neck. No back pain. He denies alcohol. No excessive caffeine, nonsmoker, no anti-inflammatory use. No history of gallbladder, liver, or pancreatic disease. No history of immunocompromise. No recent antibiotic or steroid treatment.  Past Medical History:  Diagnosis Date  . Abrasion of finger of right hand 12/28/2011   healing wound right index finger  . Fracture of phalanx of finger 12/26/2011   right ring prox. phalanx  . Inguinal hernia     There are no active problems to display for this patient.   Past Surgical History:  Procedure Laterality Date  . INGUINAL HERNIA REPAIR Left 10/07/2015   Procedure: LAPAROSCOPIC LEFT  INGUINAL HERNIA;  Surgeon: Axel FillerArmando Ramirez, MD;  Location: [facility];  Service: General;  Laterality: Left;  . INSERTION OF MESH Left 10/07/2015   Procedure: INSERTION OF MESH;  Surgeon: Axel FillerArmando Ramirez, MD;  Location: [facility];  Service: General;  Laterality: Left;  . NO PAST SURGERIES         Home Medications    Prior to Admission medications   Medication Sig Start Date End Date Taking? Authorizing Provider  acetaminophen (TYLENOL) 325 MG tablet Take 650 mg by mouth every 6 (six) hours as needed for mild pain.   Yes [provider]  omeprazole (PRILOSEC) 20 MG capsule Take 1 capsule (20 mg total) by mouth 2 (two) times daily. 01/21/17   Rolland PorterJames, Kristoff Coonradt, MD  sucralfate (CARAFATE) 1 g tablet Take 1 tablet (1 g total) by mouth 4 (four) times daily. 01/21/17   Rolland PorterJames, Shylynn Bruning, MD    Family History No family history on file.  Social History Social History  Substance Use Topics  . Smoking status: Never Smoker  . Smokeless tobacco: Never Used  . Alcohol use Yes     Comment: occasionally     Allergies   Patient has no known allergies.   Review of Systems Review of Systems  Constitutional: Negative for appetite change, chills, diaphoresis, fatigue and fever.  HENT: Positive for trouble swallowing. Negative for mouth sores and sore throat.   Eyes: Negative for visual disturbance.  Respiratory: Negative for cough, chest tightness, shortness of breath and wheezing.   Cardiovascular: Positive for chest pain.  Gastrointestinal: Positive for abdominal pain. Negative for abdominal distention, diarrhea, nausea and vomiting.  Endocrine: Negative for polydipsia, polyphagia and polyuria.  Genitourinary: Negative for dysuria, frequency and hematuria.  Musculoskeletal: Negative for gait problem.  Skin: Negative for color change, pallor and rash.  Neurological: Negative for dizziness, syncope, light-headedness and headaches.  Hematological: Does not bruise/bleed easily.  Psychiatric/Behavioral: Negative for behavioral problems and confusion.     Physical Exam Updated Vital Signs BP  125/82 (BP Location: Left Arm)   Pulse 74   Temp 98.7 F (37.1 C) (Oral)   Resp 14   Ht 5\' 9"  (1.753 m)   SpO2 99%   Physical Exam  Constitutional: He is oriented to person, place, and time. He appears well-developed and well-nourished. No distress.  HENT:  Head: Normocephalic.  Parents normal. I cannot visualize tonsils. Definitely no exudate no erythema. No adenopathy in the neck anteriorly or posteriorly.  Eyes: Conjunctivae are  normal. Pupils are equal, round, and reactive to light. No scleral icterus.  Neck: Normal range of motion. Neck supple. No thyromegaly present.  Cardiovascular: Normal rate and regular rhythm.  Exam reveals no gallop and no friction rub.   No murmur heard. Pulmonary/Chest: Effort normal and breath sounds normal. No respiratory distress. He has no wheezes. He has no rales.  Abdominal: Soft. Bowel sounds are normal. He exhibits no distension. There is no tenderness. There is no rebound.  Musculoskeletal: Normal range of motion.  Neurological: He is alert and oriented to person, place, and time.  Skin: Skin is warm and dry. No rash noted.  Psychiatric: He has a normal mood and affect. His behavior is normal.     ED Treatments / Results  Labs (all labs ordered are listed, but only abnormal results are displayed) Labs Reviewed  CBC WITH DIFFERENTIAL/PLATELET - Abnormal; Notable for the following:       Result Value   HCT 38.9 (*)    MCHC 36.5 (*)    Monocytes Absolute 1.2 (*)    All other components within normal limits  COMPREHENSIVE METABOLIC PANEL - Abnormal; Notable for the following:    Creatinine, Ser 1.37 (*)    ALT 14 (*)    All other components within normal limits  RAPID STREP SCREEN (NOT AT [facility])  CULTURE, GROUP A STREP ([facility])  LIPASE, BLOOD    EKG  EKG Interpretation None       Radiology No results found.  Procedures Procedures (including critical care time)  Medications Ordered in ED Medications  sodium chloride 0.9 % bolus 1,000 mL (0 mLs Intravenous Stopped 01/21/17 1925)  gi cocktail (Maalox,Lidocaine,Donnatal) (30 mLs Oral Given 01/21/17 1843)  pantoprazole (PROTONIX) injection 40 mg (40 mg Intravenous Given 01/21/17 1843)     Initial Impression / Assessment and Plan / ED Course  I have reviewed the triage vital signs and the nursing notes.  Pertinent labs & imaging results that were available during my care of the patient were reviewed by me and  considered in my medical decision making (see chart for details).    Likely esophagitis. No obvious inciting factor. Lab evaluation. Proton pump inhibitor. GI cocktail. We'll reevaluate.  Final Clinical Impressions(s) / ED Diagnoses   Final diagnoses:  GERD with esophagitis    New Prescriptions Discharge Medication List as of 01/21/2017  6:49 PM    START taking these medications   Details  omeprazole (PRILOSEC) 20 MG capsule Take 1 capsule (20 mg total) by mouth 2 (two) times daily., Starting Mon 01/21/2017, Print    sucralfate (CARAFATE) 1 g tablet Take 1 tablet (1 g total) by mouth 4 (four) times daily., Starting Mon 01/21/2017, Print         Rolland Porter, MD 01/25/17 [phone]

## 2017-01-21 NOTE — ED Notes (Signed)
Bed: [facility] Expected date:  Expected time:  Means of arrival:  Comments: 

## 2017-01-21 NOTE — ED Triage Notes (Signed)
Patient c/o not feeling well over past couple days. Patient c/o mouth being very dry and pain when swallowing. Patient also having hot flashed with chills.  Patient denies any n/v/d.

## 2017-01-24 ENCOUNTER — Encounter: Payer: Self-pay | Admitting: Gastroenterology

## 2017-01-24 LAB — CULTURE, GROUP A STREP (THRC)

## 2017-03-11 ENCOUNTER — Ambulatory Visit: Payer: Managed Care, Other (non HMO) | Admitting: Gastroenterology

## 2017-03-11 NOTE — Progress Notes (Deleted)
[name]    [id number]    1986-01-24  Primary Care Physician:Patient, No Pcp Per  Referring Physician: No referring provider defined for this encounter.  Chief complaint:  GERD HPI:  31 y.o.Black or African Americanmale who presents to the office today for consultation for chest pain. He states that his symptoms began about 2 weeks ago, with severe pain with swallowing any liquids or solids. The pain is mostly in his mid to lower chest, with some pain in his epigastrium as well. He thinks that the pain began after overindulging on hot sauce and became more severe after eating a piece of chocolate about a week and a half ago. He states that since then, he has not been able to eat any solid food and has lost about 15lbs in 2 weeks. He has not had any vomiting or regurgitation of food or liquid. He does feel like the bolus goes down into his stomach, but it is painful the entire time. He denies any hematemesis. No history of GERD or heartburn symptoms. He denies any recent NSAIDs or antibiotics. He was seen at [facility] ER (no records available) a few days ago and was given some IV fluids and had labs done, which he was told were normal. He is urinating, although his urine is much darker than normal. He has not had normal BMs in the past week, but attributes this to not eating solid foods. He denies any melena or hematochezia. No history of diarrhea or constipation. He denies any family history of GI issues. He was started on Omeprazole 20mg  daily and sucralfate tablets at the ER, which he has been taking for the past few days without any relief. No prior EGD or colonoscopy.     Outpatient Encounter Prescriptions as of 03/11/2017  Medication Sig  . acetaminophen (TYLENOL) 325 MG tablet Take 650 mg by mouth every 6 (six) hours as needed for mild pain.  Marland Kitchen omeprazole (PRILOSEC) 20 MG capsule Take 1 capsule (20 mg total) by mouth 2 (two) times daily.  . sucralfate (CARAFATE) 1 g  tablet Take 1 tablet (1 g total) by mouth 4 (four) times daily.   No facility-administered encounter medications on file as of 03/11/2017.     Allergies as of 03/11/2017  . (No Known Allergies)    Past Medical History:  Diagnosis Date  . Abrasion of finger of right hand 12/28/2011   healing wound right index finger  . Fracture of phalanx of finger 12/26/2011   right ring prox. phalanx  . Inguinal hernia     Past Surgical History:  Procedure Laterality Date  . INGUINAL HERNIA REPAIR Left 10/07/2015   Procedure: LAPAROSCOPIC LEFT  INGUINAL HERNIA;  Surgeon: Axel Filler, MD;  Location: [facility];  Service: General;  Laterality: Left;  . INSERTION OF MESH Left 10/07/2015   Procedure: INSERTION OF MESH;  Surgeon: Axel Filler, MD;  Location: [facility];  Service: General;  Laterality: Left;  . NO PAST SURGERIES      No family history on file.  Social History   Social History  . Marital status: Single    Spouse name: N/A  . Number of children: N/A  . Years of education: N/A   Occupational History  . Not on file.   Social History Main Topics  . Smoking status: Never Smoker  . Smokeless tobacco: Never Used  . Alcohol use Yes     Comment: occasionally  .  Drug use: No  . Sexual activity: Not on file   Other Topics Concern  . Not on file   Social History Narrative  . No narrative on file      Review of systems: Review of Systems  Constitutional: Negative for fever and chills.  HENT: Negative.   Eyes: Negative for blurred vision.  Respiratory: Negative for cough, shortness of breath and wheezing.   Cardiovascular: Negative for chest pain and palpitations.  Gastrointestinal: as per HPI Genitourinary: Negative for dysuria, urgency, frequency and hematuria.  Musculoskeletal: Negative for myalgias, back pain and joint pain.  Skin: Negative for itching and rash.  Neurological: Negative for dizziness, tremors, focal weakness, seizures and loss of consciousness.    Endo/Heme/Allergies: Positive for seasonal allergies.  Psychiatric/Behavioral: Negative for depression, suicidal ideas and hallucinations.  All other systems reviewed and are negative.   Physical Exam: There were no vitals filed for this visit. There is no height or weight on file to calculate BMI. Gen:      No acute distress HEENT:  EOMI, sclera anicteric Neck:     No masses; no thyromegaly Lungs:    Clear to auscultation bilaterally; normal respiratory effort CV:         Regular rate and rhythm; no murmurs Abd:      + bowel sounds; soft, non-tender; no palpable masses, no distension Ext:    No edema; adequate peripheral perfusion Skin:      Warm and dry; no rash Neuro: alert and oriented x 3 Psych: normal mood and affect  Data Reviewed:  Reviewed labs, radiology imaging, old records and pertinent past GI work up  ESOPHAGUS, ENDOSCOPIC BIOPSY:  ACUTE INFLAMMATION.  NO EVIDENCE OF VIRAL CYTOPATHIC CHANGES.  GMS STAIN FOR FUNGAL ORGANISMS IS NEGATIVE.  Assessment and Plan/Recommendations:  ***  25 minutes was spent face-to-face with the patient. Greater than 50% of the time used for counseling as well as treatment plan and follow-up. She had multiple questions which were answered to her satisfaction  K. Scherry Ran , MD [phone] Mon-Fri 8a-5p [phone] after 5p, weekends, holidays  CC: No ref. provider found

## 2017-07-21 ENCOUNTER — Ambulatory Visit (INDEPENDENT_AMBULATORY_CARE_PROVIDER_SITE_OTHER): Payer: Self-pay

## 2017-07-21 ENCOUNTER — Encounter (HOSPITAL_COMMUNITY): Payer: Self-pay | Admitting: *Deleted

## 2017-07-21 ENCOUNTER — Emergency Department (HOSPITAL_COMMUNITY)
Admission: EM | Admit: 2017-07-21 | Discharge: 2017-07-22 | Disposition: A | Discharge status: Home / Self Care | Payer: Self-pay | Attending: Emergency Medicine | Admitting: Emergency Medicine

## 2017-07-21 ENCOUNTER — Encounter (HOSPITAL_COMMUNITY): Payer: Self-pay | Admitting: Emergency Medicine

## 2017-07-21 ENCOUNTER — Ambulatory Visit (HOSPITAL_COMMUNITY)
Admission: EM | Admit: 2017-07-21 | Discharge: 2017-07-21 | Disposition: A | Discharge status: Home / Self Care | Payer: Self-pay | Attending: Internal Medicine | Admitting: Internal Medicine

## 2017-07-21 ENCOUNTER — Other Ambulatory Visit: Payer: Self-pay

## 2017-07-21 DIAGNOSIS — R0789 Other chest pain: Secondary | ICD-10-CM

## 2017-07-21 DIAGNOSIS — M940 Chondrocostal junction syndrome [Tietze]: Secondary | ICD-10-CM | POA: Insufficient documentation

## 2017-07-21 DIAGNOSIS — R05 Cough: Secondary | ICD-10-CM

## 2017-07-21 DIAGNOSIS — Z79899 Other long term (current) drug therapy: Secondary | ICD-10-CM | POA: Insufficient documentation

## 2017-07-21 DIAGNOSIS — R058 Other specified cough: Secondary | ICD-10-CM

## 2017-07-21 LAB — CBC WITH DIFFERENTIAL/PLATELET
Basophils Absolute: 0 10*3/uL (ref 0.0–0.1)
Basophils Relative: 0 %
EOS ABS: 0.6 10*3/uL (ref 0.0–0.7)
Eosinophils Relative: 11 %
HCT: 40.4 % (ref 39.0–52.0)
HEMOGLOBIN: 14.3 g/dL (ref 13.0–17.0)
LYMPHS ABS: 1.7 10*3/uL (ref 0.7–4.0)
LYMPHS PCT: 31 %
MCH: 28.2 pg (ref 26.0–34.0)
MCHC: 35.4 g/dL (ref 30.0–36.0)
MCV: 79.7 fL (ref 78.0–100.0)
MONOS PCT: 13 %
Monocytes Absolute: 0.7 10*3/uL (ref 0.1–1.0)
NEUTROS PCT: 45 %
Neutro Abs: 2.5 10*3/uL (ref 1.7–7.7)
Platelets: 203 10*3/uL (ref 150–400)
RBC: 5.07 MIL/uL (ref 4.22–5.81)
RDW: 15.1 % (ref 11.5–15.5)
WBC: 5.6 10*3/uL (ref 4.0–10.5)

## 2017-07-21 LAB — COMPREHENSIVE METABOLIC PANEL
ALT: 26 U/L (ref 17–63)
AST: 30 U/L (ref 15–41)
Albumin: 3.6 g/dL (ref 3.5–5.0)
Alkaline Phosphatase: 58 U/L (ref 38–126)
Anion gap: 6 (ref 5–15)
BUN: 16 mg/dL (ref 6–20)
CHLORIDE: 105 mmol/L (ref 101–111)
CO2: 26 mmol/L (ref 22–32)
CREATININE: 1.08 mg/dL (ref 0.61–1.24)
Calcium: 9 mg/dL (ref 8.9–10.3)
Glucose, Bld: 79 mg/dL (ref 65–99)
POTASSIUM: 3.9 mmol/L (ref 3.5–5.1)
SODIUM: 137 mmol/L (ref 135–145)
Total Bilirubin: 0.6 mg/dL (ref 0.3–1.2)
Total Protein: 7.8 g/dL (ref 6.5–8.1)

## 2017-07-21 LAB — D-DIMER, QUANTITATIVE: D-Dimer, Quant: 0.27 ug/mL-FEU (ref 0.00–0.50)

## 2017-07-21 LAB — I-STAT TROPONIN, ED: Troponin i, poc: 0 ng/mL (ref 0.00–0.08)

## 2017-07-21 MED ORDER — IBUPROFEN 800 MG PO TABS: 800.0000 mg | ORAL_TABLET | Freq: Three times a day (TID) | ORAL | 0 refills | Status: DC

## 2017-07-21 MED ORDER — KETOROLAC TROMETHAMINE 30 MG/ML IJ SOLN
30.0000 mg | Freq: Once | INTRAMUSCULAR | Status: AC
  Administered 2017-07-21: 30 mg via INTRAVENOUS
  Filled 2017-07-21: qty 1

## 2017-07-21 MED ORDER — GI COCKTAIL ~~LOC~~
30.0000 mL | Freq: Once | ORAL | Status: AC
  Administered 2017-07-21: 30 mL via ORAL
  Filled 2017-07-21: qty 30

## 2017-07-21 MED ORDER — CYCLOBENZAPRINE HCL 10 MG PO TABS: 10.0000 mg | ORAL_TABLET | Freq: Two times a day (BID) | ORAL | 0 refills | Status: DC | PRN

## 2017-07-21 NOTE — ED Notes (Signed)
ED Provider at bedside. 

## 2017-07-21 NOTE — ED Provider Notes (Signed)
[facility]    CSN: [id number] Arrival date & time: 07/21/17  1821     History   Chief Complaint Chief Complaint  Patient presents with  . Chest Pain    HPI Steve Flores is a 31 y.o. male.   31 year old male states that yesterday he developed some anterior chest tightness, dry cough and a sensation of pain that radiates from the middle of the chest to the throat. He denies fever, denies earache denies mucus with cough. His first words were that he had pneumonia.      Past Medical History:  Diagnosis Date  . Abrasion of finger of right hand 12/28/2011   healing wound right index finger  . Fracture of phalanx of finger 12/26/2011   right ring prox. phalanx  . Inguinal hernia     There are no active problems to display for this patient.   Past Surgical History:  Procedure Laterality Date  . INGUINAL HERNIA REPAIR Left 10/07/2015   Procedure: LAPAROSCOPIC LEFT  INGUINAL HERNIA;  Surgeon: Axel FillerArmando Ramirez, MD;  Location: [facility];  Service: General;  Laterality: Left;  . INSERTION OF MESH Left 10/07/2015   Procedure: INSERTION OF MESH;  Surgeon: Axel FillerArmando Ramirez, MD;  Location: [facility];  Service: General;  Laterality: Left;       Home Medications    Prior to Admission medications   Medication Sig Start Date End Date Taking? Authorizing Provider  acetaminophen (TYLENOL) 325 MG tablet Take 650 mg by mouth every 6 (six) hours as needed for mild pain.    [provider]  omeprazole (PRILOSEC) 20 MG capsule Take 1 capsule (20 mg total) by mouth 2 (two) times daily. 01/21/17   Rolland PorterJames, Mark, MD  sucralfate (CARAFATE) 1 g tablet Take 1 tablet (1 g total) by mouth 4 (four) times daily. 01/21/17   Rolland PorterJames, Mark, MD    Family History No family history on file.  Social History Social History   Tobacco Use  . Smoking status: Never Smoker  . Smokeless tobacco: Never Used  Substance Use Topics  . Alcohol use: Yes    Comment: occasionally  . Drug use: No      Allergies   Patient has no known allergies.   Review of Systems Review of Systems  Constitutional: Positive for activity change. Negative for fever.  HENT: Negative.   Respiratory: Positive for cough.        "Maybe a little shortness of breath sometimes"  Cardiovascular: Positive for chest pain.  Gastrointestinal: Negative.   All other systems reviewed and are negative.    Physical Exam Triage Vital Signs ED Triage Vitals  Enc Vitals Group     BP      Pulse      Resp      Temp      Temp src      SpO2      Weight      Height      Head Circumference      Peak Flow      Pain Score      Pain Loc      Pain Edu?      Excl. in GC?    No data found.  Updated Vital Signs BP 115/78 (BP Location: Right Arm)   Pulse 83   Temp 98.6 F (37 C) (Oral)   Resp 16   SpO2 100%   Visual Acuity Right Eye Distance:   Left Eye Distance:   Bilateral Distance:  Right Eye Near:   Left Eye Near:    Bilateral Near:     Physical Exam  Constitutional: He is oriented to person, place, and time. He appears well-developed and well-nourished. No distress.  HENT:  Oropharynx with mild erythema, injected. No exudate.  Eyes: EOM are normal.  Neck: Normal range of motion. Neck supple.  Cardiovascular: Normal rate, regular rhythm and normal heart sounds.  Pulmonary/Chest: Effort normal. No respiratory distress.  Patient unable or unwilling to take a good deep breath. Also unable or unwilling to exhale forcefully. No adventitious sounds with low volume air movement.  Musculoskeletal: Normal range of motion. He exhibits no edema.  Neurological: He is alert and oriented to person, place, and time. He exhibits normal muscle tone.  Skin: Skin is warm and dry. He is not diaphoretic.  Psychiatric: He has a normal mood and affect.  Nursing note and vitals reviewed.  Chest x-ray is normal. After speaking to the patient and suggesting possible diagnosis of heartburn or URI symptoms the  patient was then able to further explain his chest pain symptoms. This is a deep heavy pain that is worse when leaning forward and often when lying flat. He feels like his chest is caving in. He denies reflux, heartburn or GI symptoms. He also describes it as a deep pulling in his chest. Due to this better description and no diagnosis for his chest pain will send to merge department for consideration of pericarditis, PE or other etiology.  UC Treatments / Results  Labs (all labs ordered are listed, but only abnormal results are displayed) Labs Reviewed - No data to display  EKG  EKG Interpretation None       Radiology Dg Chest 2 View  Result Date: 07/21/2017 CLINICAL DATA:  Cough and chest pain since yesterday. EXAM: CHEST  2 VIEW COMPARISON:  None. FINDINGS: The heart size and mediastinal contours are within normal limits. Both lungs are clear. The visualized skeletal structures are unremarkable. IMPRESSION: No active cardiopulmonary disease. Electronically Signed   By: Sherian ReinWei-Chen  Lin M.D.   On: 07/21/2017 20:54    Procedures Procedures (including critical care time)  Medications Ordered in UC Medications - No data to display   Initial Impression / Assessment and Plan / UC Course  I have reviewed the triage vital signs and the nursing notes.  Pertinent labs & imaging results that were available during my care of the patient were reviewed by me and considered in my medical decision making (see chart for details).       Final Clinical Impressions(s) / UC Diagnoses   Final diagnoses:  Other chest pain  Dry cough    ED Discharge Orders    None       Controlled Substance Prescriptions [place] Controlled Substance Registry consulted? Not Applicable   Hayden RasmussenMabe, Reola Buckles, NP 07/21/17 2118

## 2017-07-21 NOTE — ED Provider Notes (Signed)
[facility] Provider Note   CSN: [id number] Arrival date & time: 07/21/17  2126     History   Chief Complaint Chief Complaint  Patient presents with  . Chest Pain    HPI Steve Flores is a 31 y.o. male.  Patient presents to the ED with a chief complaint of central chest pain that started last night at 6pm.  He states that the pain is in the center of his chest.  It is worsened with breathing. He states that it feels like a heaviness and a pressure pulling his chest down. He states that he has had a dry cough, but denies any fever or chills.  He denies any hx of the same.  He denies hx of ACS, PE, or DVT.  Denies any recent long travel, immobilization, or surgeries.  Denies smoking or drug use.  He has not taken anything for his symptoms.  He denies any changes with eating or drinking and states that it doesn't feel like acid reflux.   The history is provided by the patient. No language interpreter was used.    Past Medical History:  Diagnosis Date  . Abrasion of finger of right hand 12/28/2011   healing wound right index finger  . Fracture of phalanx of finger 12/26/2011   right ring prox. phalanx  . Inguinal hernia     There are no active problems to display for this patient.   Past Surgical History:  Procedure Laterality Date  . INGUINAL HERNIA REPAIR Left 10/07/2015   Procedure: LAPAROSCOPIC LEFT  INGUINAL HERNIA;  Surgeon: Axel FillerArmando Ramirez, MD;  Location: [facility];  Service: General;  Laterality: Left;  . INSERTION OF MESH Left 10/07/2015   Procedure: INSERTION OF MESH;  Surgeon: Axel FillerArmando Ramirez, MD;  Location: [facility];  Service: General;  Laterality: Left;       Home Medications    Prior to Admission medications   Medication Sig Start Date End Date Taking? Authorizing Provider  acetaminophen (TYLENOL) 325 MG tablet Take 650 mg by mouth every 6 (six) hours as needed for mild pain.    [provider]  omeprazole  (PRILOSEC) 20 MG capsule Take 1 capsule (20 mg total) by mouth 2 (two) times daily. 01/21/17   Rolland PorterJames, Mark, MD  sucralfate (CARAFATE) 1 g tablet Take 1 tablet (1 g total) by mouth 4 (four) times daily. 01/21/17   Rolland PorterJames, Mark, MD    Family History No family history on file.  Social History Social History   Tobacco Use  . Smoking status: Never Smoker  . Smokeless tobacco: Never Used  Substance Use Topics  . Alcohol use: Yes    Comment: occasionally  . Drug use: No     Allergies   Patient has no known allergies.   Review of Systems Review of Systems  All other systems reviewed and are negative.    Physical Exam Updated Vital Signs BP 117/88   Pulse 82   Temp 98.9 F (37.2 C) (Oral)   Resp 20   Ht 5\' 9"  (1.753 m)   Wt 77.1 kg (170 lb)   SpO2 100%   BMI 25.10 kg/m   Physical Exam  Constitutional: He is oriented to person, place, and time. He appears well-developed and well-nourished.  HENT:  Head: Normocephalic and atraumatic.  Eyes: Conjunctivae and EOM are normal. Pupils are equal, round, and reactive to light. Right eye exhibits no discharge. Left eye exhibits no discharge. No scleral icterus.  Neck:  Normal range of motion. Neck supple. No JVD present.  Cardiovascular: Normal rate, regular rhythm and normal heart sounds. Exam reveals no gallop and no friction rub.  No murmur heard. Pulmonary/Chest: Effort normal and breath sounds normal. No respiratory distress. He has no wheezes. He has no rales. He exhibits no tenderness.  Some anterior chest wall discomfort with palpation, no crepitus  Abdominal: Soft. He exhibits no distension and no mass. There is no tenderness. There is no rebound and no guarding.  Musculoskeletal: Normal range of motion. He exhibits no edema or tenderness.  Neurological: He is alert and oriented to person, place, and time.  Skin: Skin is warm and dry.  Psychiatric: He has a normal mood and affect. His behavior is normal. Judgment and thought  content normal.  Nursing note and vitals reviewed.    ED Treatments / Results  Labs (all labs ordered are listed, but only abnormal results are displayed) Labs Reviewed  COMPREHENSIVE METABOLIC PANEL  CBC WITH DIFFERENTIAL/PLATELET  D-DIMER, QUANTITATIVE (NOT AT [facility])  I-STAT TROPONIN, ED    EKG  EKG Interpretation  Date/Time:  Sunday July 21 2017 21:31:15 EST Ventricular Rate:  81 PR Interval:  134 QRS Duration: 72 QT Interval:  362 QTC Calculation: 420 R Axis:   -59 Text Interpretation:  Normal sinus rhythm Left anterior fascicular block Nonspecific ST and T wave abnormality No previous tracing Confirmed by Steinl, Kevin ([id number]) on 07/21/2017 10:17:07 PM       Radiology Dg Chest 2 View  Result Date: 07/21/2017 CLINICAL DATA:  Cough and chest pain since yesterday. EXAM: CHEST  2 VIEW COMPARISON:  None. FINDINGS: The heart size and mediastinal contours are within normal limits. Both lungs are clear. The visualized skeletal structures are unremarkable. IMPRESSION: No active cardiopulmonary disease. Electronically Signed   By: Wei-Chen  Lin M.D.   On: 07/21/2017 20:54    Procedures Procedures (including critical care time)  Medications Ordered in ED Medications  gi cocktail (Maalox,Lidocaine,Donnatal) (not administered)  ketorolac (TORADOL) 30 MG/ML injection 30 mg (not administered)     Initial Impression / Assessment and Plan / ED Course  I have reviewed the triage vital signs and the nursing notes.  Pertinent labs & imaging results that were available during my care of the patient were reviewed by me and considered in my medical decision making (see chart for details).     Patient presents with chest pain that is central and started last night.  Review of prior visits show visit to urgent care today for cough and cold symptoms with CP.  DDx includes ACS, PE, pneumothorax, aortic dissection, esophageal rupture, pericarditis, chest wall pain.  Doubt ACS,  normal troponin, no ischemic EKG findings, HEART score is: 0.  Low risk for PE, PERC negative, d-dimer negative, patient is not tachycardic nor hypoxic.  No evidence of pneumothorax on CXR.  Doubt dissection, no mediastinal widening on CXR, no ripping/tearing chest pain, neurovascularly intact.  Doubt pericarditis, no positional changes, or diffuse ST elevations on EKG.  Pain is reproducible, likely MSK.  Pain significantly improved with toradol.  Recommend close follow-up with PCP.  All findings were discussed with patient.  Patient understands and agrees with the plan.     Final Clinical Impressions(s) / ED Diagnoses   Final diagnoses:  Costochondritis    ED Discharge Orders        Ordered    ibuprofen (ADVIL,MOTRIN) 800 MG tablet  3 times daily     12 /30/18 2346    cyclobenzaprine (  FLEXERIL) 10 MG tablet  2 times daily PRN     07/21/17 2346       Roxy HorsemanBrowning, Trinitee Horgan, PA-C 07/21/17 2347    Cathren LaineSteinl, Kevin, MD 07/24/17 [phone]

## 2017-07-21 NOTE — ED Triage Notes (Signed)
Assessment per provider 

## 2017-07-21 NOTE — Discharge Instructions (Signed)
Care to the emergency department for evaluation of chest pain.

## 2017-07-21 NOTE — ED Triage Notes (Signed)
Pt c/o midsternal CP radiating up into throat onset yesterday @ 1800 while walking home from a park.  Dull/aching, constant. Denies shob, nausea/vomiting/diaphoresis.

## 2017-11-04 DIAGNOSIS — K219 Gastro-esophageal reflux disease without esophagitis: Secondary | ICD-10-CM | POA: Insufficient documentation

## 2017-11-04 DIAGNOSIS — R490 Dysphonia: Secondary | ICD-10-CM | POA: Insufficient documentation

## 2018-01-08 ENCOUNTER — Encounter (INDEPENDENT_AMBULATORY_CARE_PROVIDER_SITE_OTHER): Payer: Self-pay

## 2018-01-10 ENCOUNTER — Ambulatory Visit (INDEPENDENT_AMBULATORY_CARE_PROVIDER_SITE_OTHER): Payer: No Typology Code available for payment source | Admitting: Neurology

## 2018-01-10 ENCOUNTER — Encounter: Payer: Self-pay | Admitting: Neurology

## 2018-01-10 DIAGNOSIS — G5603 Carpal tunnel syndrome, bilateral upper limbs: Secondary | ICD-10-CM | POA: Diagnosis not present

## 2018-01-10 DIAGNOSIS — Z0289 Encounter for other administrative examinations: Secondary | ICD-10-CM

## 2018-01-10 NOTE — Procedures (Signed)
Full Name: Steve Flores Gender: Male MRN #: [id number] Date of Birth: Aug 08, 1985    Visit Date: 01/10/18 10:03 Age: 32 Years 1 Months Old Examining Physician: Levert Feinstein, MD  Referring Physician: Dr. Emmaline Life History: 32 years old male, presented with few months history of intermittent bilateral hands paresthesia, right worse than left, mainly involving the first lateral 4 fingers.  Summary of the tests:  Nerve conduction study: Bilateral median sensory responses showed mildly prolonged peak latency, right worse than left.  Bilateral median motor responses showed mildly prolonged distal latency, right worse than left;, with well-preserved C map amplitude, conduction velocity.  Bilateral ulnar sensory and motor responses were normal.  Electromyography: Selective needle examination of right upper extremity muscles, and left abductor pollicis brevis were performed.  There is evidence of mildly decreased recruitment at bilateral abductor pollicis brevis muscles, there is no evidence of active denervation, motor unit morphology was normal.  Conclusion: This is an abnormal study.  There is electrodiagnostic evidence of bilateral median neuropathy across the wrist, consistent with moderate bilateral carpal tunnel syndromes, demyelinating nature; there is no evidence of axonal loss.    ------------------------------- Levert Feinstein, M.D.  [facility] [address] [place], [place] [address] Tel: [phone] Fax: [phone]        [facility]    Nerve / Sites Muscle Latency Ref. Amplitude Ref. Rel Amp Segments Distance Velocity Ref. Area    ms ms mV mV %  cm m/s m/s mVms  R Median - APB     Wrist APB 4.8 ?4.4 5.7 ?4.0 100 Wrist - APB 7   14.7     Upper arm APB 8.9  5.4  95.1 Upper arm - Wrist 24 59 ?49 14.2  L Median - APB     Wrist APB 4.4 ?4.4 8.7 ?4.0 100 Wrist - APB 7   21.8     Upper arm APB 8.8  8.2  94.1 Upper arm - Wrist 24 55 ?49 20.7  R Ulnar -  ADM     Wrist ADM 2.4 ?3.3 7.0 ?6.0 100 Wrist - ADM 7   20.0     B.Elbow ADM 6.4  6.6  94.1 B.Elbow - Wrist 22 56 ?49 18.6     A.Elbow ADM 8.2  6.6  100 A.Elbow - B.Elbow 10 55 ?49 18.6         A.Elbow - Wrist      L Ulnar - ADM     Wrist ADM 2.5 ?3.3 8.7 ?6.0 100 Wrist - ADM 7   26.0     B.Elbow ADM 6.0  8.8  102 B.Elbow - Wrist 22 63 ?49 27.3     A.Elbow ADM 7.7  8.5  96.6 A.Elbow - B.Elbow 10 60 ?49 26.5         A.Elbow - Wrist                 SNC    Nerve / Sites Rec. Site Peak Lat Ref.  Amp Ref. Segments Distance    ms ms V V  cm  R Median - Orthodromic (Dig II, Mid palm)     Dig II Wrist 4.0 ?3.4 9 ?10 Dig II - Wrist 13  L Median - Orthodromic (Dig II, Mid palm)     Dig II Wrist 3.8 ?3.4 12 ?10 Dig II - Wrist 13  R Ulnar - Orthodromic, (Dig V, Mid palm)     Dig V Wrist 2.6 ?3.1  13 ?5 Dig V - Wrist 11  L Ulnar - Orthodromic, (Dig V, Mid palm)     Dig V Wrist 2.6 ?3.1 11 ?5 Dig V - Wrist 5111              F  Wave    Nerve F Lat Ref.   ms ms  R Ulnar - ADM 27.5 ?32.0  L Ulnar - ADM 28.4 ?32.0         EMG full       EMG Summary Table    Spontaneous MUAP Recruitment  Muscle IA Fib PSW Fasc Other Amp Dur. Poly Pattern  R. Abductor pollicis brevis Normal None None None _______ Normal Normal Normal Normal  R. Biceps brachii Normal None None None _______ Normal Normal Normal Normal  R. Deltoid Normal None None None _______ Normal Normal Normal Normal  R. Triceps brachii Normal None None None _______ Normal Normal Normal Normal  R. Pronator teres Normal None None None _______ Normal Normal Normal Normal  R. Extensor digitorum communis Normal None None None _______ Normal Normal Normal Normal  L. Abductor pollicis brevis Normal None None None _______ Normal Normal Normal Reduced

## 2019-03-06 ENCOUNTER — Other Ambulatory Visit: Payer: Self-pay

## 2019-03-06 ENCOUNTER — Ambulatory Visit (HOSPITAL_COMMUNITY)
Admission: EM | Admit: 2019-03-06 | Discharge: 2019-03-06 | Disposition: A | Discharge status: Home / Self Care | Payer: Self-pay | Attending: Urgent Care | Admitting: Urgent Care

## 2019-03-06 ENCOUNTER — Encounter (HOSPITAL_COMMUNITY): Payer: Self-pay | Admitting: Emergency Medicine

## 2019-03-06 DIAGNOSIS — G5603 Carpal tunnel syndrome, bilateral upper limbs: Secondary | ICD-10-CM

## 2019-03-06 DIAGNOSIS — M25531 Pain in right wrist: Secondary | ICD-10-CM

## 2019-03-06 DIAGNOSIS — M25532 Pain in left wrist: Secondary | ICD-10-CM

## 2019-03-06 MED ORDER — METHYLPREDNISOLONE ACETATE 80 MG/ML IJ SUSP
80.0000 mg | Freq: Once | INTRAMUSCULAR | Status: AC
  Administered 2019-03-06: 80 mg via INTRAMUSCULAR

## 2019-03-06 MED ORDER — METHYLPREDNISOLONE ACETATE 80 MG/ML IJ SUSP
INTRAMUSCULAR | Status: AC
  Filled 2019-03-06: qty 1

## 2019-03-06 MED ORDER — NAPROXEN 500 MG PO TABS: 500.0000 mg | ORAL_TABLET | Freq: Two times a day (BID) | ORAL | 0 refills | Status: DC

## 2019-03-06 NOTE — ED Provider Notes (Signed)
MRN: [id number] DOB: 09/12/1985  Subjective:   Steve Flores is a 33 y.o. male presenting for 1 year history of intermittent bilateral wrist pain and swelling.  This particular episode started 1 to 2 days ago and is worse over his left wrist, has constant severe aching pain with some swelling and associated numbness and tingling of his fingertips.  The right wrist also hurts but not as bad as the left.  Has tried aspirin with minimal relief.  He is wearing wrist splints.  Patient has been told he has carpal tunnel syndrome.  He is not keen on getting the surgery however.  He was recently laid off, was working for [employer].  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  Denies taking chronic medications.    No Known Allergies  Past Medical History:  Diagnosis Date  . Abrasion of finger of right hand 12/28/2011   healing wound right index finger  . Fracture of phalanx of finger 12/26/2011   right ring prox. phalanx  . Inguinal hernia      Past Surgical History:  Procedure Laterality Date  . INGUINAL HERNIA REPAIR Left 10/07/2015   Procedure: LAPAROSCOPIC LEFT  INGUINAL HERNIA;  Surgeon: Ralene Ok, MD;  Location: [facility];  Service: General;  Laterality: Left;  . INSERTION OF MESH Left 10/07/2015   Procedure: INSERTION OF MESH;  Surgeon: Ralene Ok, MD;  Location: [facility];  Service: General;  Laterality: Left;    ROS  Objective:   Vitals: BP 122/80   Pulse 64   Temp 98.1 F (36.7 C) (Oral)   Resp 16   SpO2 97%   Physical Exam Constitutional:      Appearance: Normal appearance. He is well-developed and normal weight.  HENT:     Head: Normocephalic and atraumatic.     Right Ear: External ear normal.     Left Ear: External ear normal.     Nose: Nose normal.     Mouth/Throat:     Pharynx: Oropharynx is clear.  Eyes:     Extraocular Movements: Extraocular movements intact.     Pupils: Pupils are equal, round, and reactive to light.   Cardiovascular:     Rate and Rhythm: Normal rate.  Pulmonary:     Effort: Pulmonary effort is normal.  Musculoskeletal:     Right wrist: He exhibits decreased range of motion (Flexion and extension), tenderness and swelling (1+ over left, trace over right). He exhibits no bony tenderness, no effusion, no crepitus, no deformity and no laceration.     Left wrist: He exhibits decreased range of motion (Flexion and extension), tenderness and swelling (1+). He exhibits no bony tenderness, no effusion, no crepitus, no deformity and no laceration.     Comments: Positive Tinel's in left wrist but not right.  Neurological:     Mental Status: He is alert and oriented to person, place, and time.  Psychiatric:        Mood and Affect: Mood normal.        Behavior: Behavior normal.     Assessment and Plan :   1. Bilateral carpal tunnel syndrome   2. Pain in both wrists     Will use IM Depo-Medrol today while in clinic, naproxen at home.  Counseled patient on need for physical therapy and/or possible orthopedic surgery.  Patient provided with information for [facility].  Recommended that he modify his physical activities especially while he is not working  to allow for recovery in his CTS. Counseled patient on potential for adverse effects with medications prescribed/recommended today, ER and return-to-clinic precautions discussed, patient verbalized understanding.   Wallis BambergMani, Gerilynn Mccullars, PA-C 03/06/19 1150

## 2019-03-06 NOTE — ED Triage Notes (Signed)
PT reports severe pain in both wrists. PT reports pain started yesterday and occurred first in left wrist. No injury yesterday. 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   Pain has been intermittent for over a year. PT has bilateral wrist braces on.

## 2019-05-21 ENCOUNTER — Ambulatory Visit (HOSPITAL_COMMUNITY)
Admission: EM | Admit: 2019-05-21 | Discharge: 2019-05-21 | Disposition: A | Discharge status: Home / Self Care | Payer: Self-pay | Attending: Family Medicine | Admitting: Family Medicine

## 2019-05-21 ENCOUNTER — Encounter (HOSPITAL_COMMUNITY): Payer: Self-pay

## 2019-05-21 ENCOUNTER — Other Ambulatory Visit: Payer: Self-pay

## 2019-05-21 DIAGNOSIS — M79601 Pain in right arm: Secondary | ICD-10-CM

## 2019-05-21 DIAGNOSIS — M542 Cervicalgia: Secondary | ICD-10-CM

## 2019-05-21 MED ORDER — TRAMADOL HCL 50 MG PO TABS: 50.0000 mg | ORAL_TABLET | Freq: Four times a day (QID) | ORAL | 0 refills | Status: DC | PRN

## 2019-05-21 MED ORDER — PREDNISONE 10 MG (21) PO TBPK: ORAL_TABLET | Freq: Every day | ORAL | 0 refills | Status: DC

## 2019-05-21 NOTE — Discharge Instructions (Signed)
Be aware, pain medications may cause drowsiness. Please do not drive, operate heavy machinery or make important decisions while on this medication, it can cloud your judgement.  

## 2019-05-21 NOTE — ED Triage Notes (Signed)
Pt presents with ongoing right shoulder, neck, and arm pain for about 6 weeks.

## 2019-05-26 NOTE — ED Provider Notes (Signed)
[place]   [id number] 05/21/19 Arrival Time: 8756  ASSESSMENT & PLAN:  1. Neck pain   2. Right arm pain     No indication for imaging at this time. Discussed.  To begin trial of: Meds ordered this encounter  Medications  . predniSONE (STERAPRED UNI-PAK 21 TAB) 10 MG (21) TBPK tablet    Sig: Take by mouth daily. Take as directed.    Dispense:  21 tablet    Refill:  0  . traMADol (ULTRAM) 50 MG tablet    Sig: Take 1 tablet (50 mg total) by mouth every 6 (six) hours as needed.    Dispense:  15 tablet    Refill:  0   Encouraged ROM as he tolerates.  Recommend: Follow-up Information    [place].   Why: If worsening or failing to improve as anticipated. Contact information: [place] [phone]          [place] Controlled Substances Registry consulted for this patient. I feel the risk/benefit ratio today is favorable for proceeding with this prescription for a controlled substance. Medication sedation precautions given.  Reviewed expectations re: course of current medical issues. Questions answered. Outlined signs and symptoms indicating need for more acute intervention. Patient verbalized understanding. After Visit Summary given.  SUBJECTIVE: History from: patient. Steve Flores is a 33 y.o. male who reports intermittent but frequent mild to moderate pain of his right right arm and neck; described as aching and stiff at times; questions if pain radiates from neck to right arm. Occasional tingling of right wrist and finger. Onset: gradual. First noted: apprxo 2-3 months ago; he is unsure. Injury/trama: no. Symptoms have waxed and waned but are worse overall since beginning. Aggravating factors: notices symptoms more with freqent use of arms. Alleviating factors: rest. Associated symptoms: none reported. Extremity weakness: none. Self treatment: NSAID, without significant  relief.  No associated CP or SOB. No specific back pain reported.  Past Surgical History:  Procedure Laterality Date  . INGUINAL HERNIA REPAIR Left 10/07/2015   Procedure: LAPAROSCOPIC LEFT  INGUINAL HERNIA;  Surgeon: Ralene Ok, MD;  Location: [facility];  Service: General;  Laterality: Left;  . INSERTION OF MESH Left 10/07/2015   Procedure: INSERTION OF MESH;  Surgeon: Ralene Ok, MD;  Location: [facility];  Service: General;  Laterality: Left;     ROS: As per HPI. All other systems negative.    OBJECTIVE:  Vitals:   05/21/19 1858  BP: 109/66  Pulse: 68  Resp: 16  Temp: 98.1 F (36.7 C)  TempSrc: Temporal  SpO2: 100%    General appearance: alert; no distress HEENT: [place]; AT Neck: supple with FROM; no midline tenderness; does reports mild tenderness over R neck musculature CV: RRR Resp: unlabored respirations Extremities: . RUE: warm with well perfused appearance; without localized tenderness over right shoulder, arm, and hand; without gross deformities; swelling: none; bruising: none; RUE ROM: normal without reported discomfort CV: brisk extremity capillary refill of RUE; 2+ radial pulse of RUE. Skin: warm and dry; no visible rashes Neurologic: gait normal; normal reflexes of bilateral UE; normal sensation of bilateral UE; normal strength of bilateral UE Psychological: alert and cooperative; normal mood and affect   No Known Allergies  Past Medical History:  Diagnosis Date  . Abrasion of finger of right hand 12/28/2011   healing wound right index finger  . Fracture of phalanx of finger 12/26/2011   right ring prox.  phalanx  . Inguinal hernia    Social History   Socioeconomic History  . Marital status: Single    Spouse name: Not on file  . Number of children: Not on file  . Years of education: Not on file  . Highest education level: Not on file  Occupational History  . Not on file  Social Needs  . Financial resource strain: Not on file  . Food insecurity     Worry: Not on file    Inability: Not on file  . Transportation needs    Medical: Not on file    Non-medical: Not on file  Tobacco Use  . Smoking status: Never Smoker  . Smokeless tobacco: Never Used  Substance and Sexual Activity  . Alcohol use: Yes    Comment: occasionally  . Drug use: No  . Sexual activity: Not on file  Lifestyle  . Physical activity    Days per week: Not on file    Minutes per session: Not on file  . Stress: Not on file  Relationships  . Social Musician on phone: Not on file    Gets together: Not on file    Attends religious service: Not on file    Active member of club or organization: Not on file    Attends meetings of clubs or organizations: Not on file    Relationship status: Not on file  Other Topics Concern  . Not on file  Social History Narrative  . Not on file   Family History  Family history unknown: Yes   Past Surgical History:  Procedure Laterality Date  . INGUINAL HERNIA REPAIR Left 10/07/2015   Procedure: LAPAROSCOPIC LEFT  INGUINAL HERNIA;  Surgeon: Axel Filler, MD;  Location: [facility];  Service: General;  Laterality: Left;  . INSERTION OF MESH Left 10/07/2015   Procedure: INSERTION OF MESH;  Surgeon: Axel Filler, MD;  Location: [facility];  Service: General;  Laterality: Left;      Mardella Layman, MD 05/26/19 [phone]

## 2019-06-04 ENCOUNTER — Other Ambulatory Visit: Payer: Self-pay

## 2019-06-04 ENCOUNTER — Emergency Department (HOSPITAL_COMMUNITY): Payer: Self-pay

## 2019-06-04 ENCOUNTER — Encounter (HOSPITAL_COMMUNITY): Payer: Self-pay | Admitting: Emergency Medicine

## 2019-06-04 ENCOUNTER — Emergency Department (HOSPITAL_COMMUNITY)
Admission: EM | Admit: 2019-06-04 | Discharge: 2019-06-05 | Disposition: A | Discharge status: Home / Self Care | Payer: Self-pay | Attending: Emergency Medicine | Admitting: Emergency Medicine

## 2019-06-04 DIAGNOSIS — R0789 Other chest pain: Secondary | ICD-10-CM | POA: Insufficient documentation

## 2019-06-04 DIAGNOSIS — Z5321 Procedure and treatment not carried out due to patient leaving prior to being seen by health care provider: Secondary | ICD-10-CM | POA: Insufficient documentation

## 2019-06-04 LAB — TROPONIN I (HIGH SENSITIVITY)
Troponin I (High Sensitivity): 4 ng/L (ref <18)
Troponin I (High Sensitivity): 3 ng/L (ref <18)

## 2019-06-04 LAB — BASIC METABOLIC PANEL
Anion gap: 9 (ref 5–15)
BUN: 13 mg/dL (ref 6–20)
CO2: 24 mmol/L (ref 22–32)
Calcium: 9.4 mg/dL (ref 8.9–10.3)
Chloride: 102 mmol/L (ref 98–111)
Creatinine, Ser: 1.49 mg/dL — ABNORMAL HIGH (ref 0.61–1.24)
GFR calc Af Amer: >60 mL/min (ref >60)
GFR calc non Af Amer: >60 mL/min (ref >60)
Glucose, Bld: 117 mg/dL — ABNORMAL HIGH (ref 70–99)
Potassium: 4.7 mmol/L (ref 3.5–5.1)
Sodium: 135 mmol/L (ref 135–145)

## 2019-06-04 LAB — CBC
HCT: 41.6 % (ref 39.0–52.0)
Hemoglobin: 14.6 g/dL (ref 13.0–17.0)
MCH: 28.3 pg (ref 26.0–34.0)
MCHC: 35.1 g/dL (ref 30.0–36.0)
MCV: 80.6 fL (ref 80.0–100.0)
Platelets: 235 10*3/uL (ref 150–400)
RBC: 5.16 MIL/uL (ref 4.22–5.81)
RDW: 14.7 % (ref 11.5–15.5)
WBC: 5.7 10*3/uL (ref 4.0–10.5)
nRBC: 0 % (ref 0.0–0.2)

## 2019-06-04 NOTE — ED Triage Notes (Signed)
Patient states he went to do check up to participate in a research study at [facility] when they noted abnormal EKG. Patient states this was 2 days ago but was too scared to come right away. Reports left sided chest pain intermittent x 1 year.

## 2019-06-05 NOTE — ED Notes (Signed)
Called pt name to be seen by Dr Reather Converse. No response from pt .

## 2019-06-07 ENCOUNTER — Other Ambulatory Visit: Payer: Self-pay

## 2019-06-07 ENCOUNTER — Emergency Department (HOSPITAL_COMMUNITY): Payer: Self-pay

## 2019-06-07 ENCOUNTER — Encounter (HOSPITAL_COMMUNITY): Payer: Self-pay

## 2019-06-07 ENCOUNTER — Emergency Department (HOSPITAL_COMMUNITY)
Admission: EM | Admit: 2019-06-07 | Discharge: 2019-06-07 | Disposition: A | Discharge status: Home / Self Care | Payer: Self-pay | Attending: Emergency Medicine | Admitting: Emergency Medicine

## 2019-06-07 DIAGNOSIS — Z79899 Other long term (current) drug therapy: Secondary | ICD-10-CM | POA: Insufficient documentation

## 2019-06-07 DIAGNOSIS — R0789 Other chest pain: Secondary | ICD-10-CM | POA: Insufficient documentation

## 2019-06-07 LAB — BASIC METABOLIC PANEL
Anion gap: 10 (ref 5–15)
BUN: 14 mg/dL (ref 6–20)
CO2: 25 mmol/L (ref 22–32)
Calcium: 9.3 mg/dL (ref 8.9–10.3)
Chloride: 102 mmol/L (ref 98–111)
Creatinine, Ser: 1.06 mg/dL (ref 0.61–1.24)
GFR calc Af Amer: >60 mL/min (ref >60)
GFR calc non Af Amer: >60 mL/min (ref >60)
Glucose, Bld: 103 mg/dL — ABNORMAL HIGH (ref 70–99)
Potassium: 4 mmol/L (ref 3.5–5.1)
Sodium: 137 mmol/L (ref 135–145)

## 2019-06-07 LAB — CBC
HCT: 39.4 % (ref 39.0–52.0)
Hemoglobin: 13.7 g/dL (ref 13.0–17.0)
MCH: 28.1 pg (ref 26.0–34.0)
MCHC: 34.8 g/dL (ref 30.0–36.0)
MCV: 80.7 fL (ref 80.0–100.0)
Platelets: 238 10*3/uL (ref 150–400)
RBC: 4.88 MIL/uL (ref 4.22–5.81)
RDW: 14.7 % (ref 11.5–15.5)
WBC: 8 10*3/uL (ref 4.0–10.5)
nRBC: 0 % (ref 0.0–0.2)

## 2019-06-07 LAB — TROPONIN I (HIGH SENSITIVITY)
Troponin I (High Sensitivity): 5 ng/L (ref <18)
Troponin I (High Sensitivity): 4 ng/L (ref <18)

## 2019-06-07 MED ORDER — SODIUM CHLORIDE 0.9% FLUSH: 3.0000 mL | Freq: Once | INTRAVENOUS | Status: DC

## 2019-06-07 NOTE — ED Provider Notes (Signed)
[facility] Provider Note   CSN: [id number] Arrival date & time: 06/07/19  0115     History   Chief Complaint Chief Complaint  Patient presents with  . Chest Pain    HPI Louellen MolderDennis J Marcott is a 33 y.o. male.     HPI   Louellen MolderDennis J Hickling is a 33 y.o. male, patient with no pertinent past medical history, presenting to the ED with chest pain intermittent for the last year.  This particular episode began last night around 10 PM while patient was at rest.  Pain is in the left lower chest, radiating to left lateral chest, 7/10, feels like a "pulling."  Pain is worse with palpation and moving his torso.  Per 911 dispatcher instructions, patient took 325 mg ASA prior to EMS arrival, which temporarily improved the pain. He notes previous episodes of intermittent palpitations while at rest that last for under a minute, but none in the last several days. He adds he was being evaluated for a research study with [facility] and an EKG was performed earlier this week.  There were abnormalities noted and he was advised to follow-up on this. Denies family history of SCD.  Denies symptom onset with exertion at any point in his life.  Occasional caffeine and energy drink use.  Denies use of tobacco, illicit drugs, or alcohol.  Denies fever/chills, recent illness, cough, shortness of breath, N/V/D, trauma, dizziness, syncope, abdominal pain, hemoptysis, or any other complaints.    Past Medical History:  Diagnosis Date  . Abrasion of finger of right hand 12/28/2011   healing wound right index finger  . Fracture of phalanx of finger 12/26/2011   right ring prox. phalanx  . Inguinal hernia     Patient Active Problem List   Diagnosis Date Noted  . Bilateral carpal tunnel syndrome 01/10/2018    Past Surgical History:  Procedure Laterality Date  . INGUINAL HERNIA REPAIR Left 10/07/2015   Procedure: LAPAROSCOPIC LEFT  INGUINAL HERNIA;  Surgeon: Axel FillerArmando Ramirez, MD;   Location: [facility];  Service: General;  Laterality: Left;  . INSERTION OF MESH Left 10/07/2015   Procedure: INSERTION OF MESH;  Surgeon: Axel FillerArmando Ramirez, MD;  Location: [facility];  Service: General;  Laterality: Left;        Home Medications    Prior to Admission medications   Medication Sig Start Date End Date Taking? Authorizing Provider  acetaminophen (TYLENOL) 325 MG tablet Take 650 mg by mouth every 6 (six) hours as needed for mild pain.    [provider]  cyclobenzaprine (FLEXERIL) 10 MG tablet Take 1 tablet (10 mg total) by mouth 2 (two) times daily as needed for muscle spasms. 07/21/17   Roxy HorsemanBrowning, Robert, PA-C  naproxen (NAPROSYN) 500 MG tablet Take 1 tablet (500 mg total) by mouth 2 (two) times daily. 03/06/19   Wallis BambergMani, Mario, PA-C  predniSONE (STERAPRED UNI-PAK 21 TAB) 10 MG (21) TBPK tablet Take by mouth daily. Take as directed. 05/21/19   Mardella LaymanHagler, Brian, MD  traMADol (ULTRAM) 50 MG tablet Take 1 tablet (50 mg total) by mouth every 6 (six) hours as needed. 05/21/19   Mardella LaymanHagler, Brian, MD  omeprazole (PRILOSEC) 20 MG capsule Take 1 capsule (20 mg total) by mouth 2 (two) times daily. Patient not taking: Reported on 07/21/2017 01/21/17 03/06/19  Rolland PorterJames, Mark, MD  sucralfate (CARAFATE) 1 g tablet Take 1 tablet (1 g total) by mouth 4 (four) times daily. Patient not taking: Reported on 07/21/2017 01/21/17 03/06/19  Rolland Porter, MD    Family History Family History  Family history unknown: Yes    Social History Social History   Tobacco Use  . Smoking status: Never Smoker  . Smokeless tobacco: Never Used  Substance Use Topics  . Alcohol use: Yes    Comment: occasionally  . Drug use: No     Allergies   Patient has no known allergies.   Review of Systems Review of Systems  Constitutional: Negative for chills, diaphoresis and fever.  Respiratory: Negative for cough and shortness of breath.   Cardiovascular: Positive for chest pain and palpitations (none in the last several  days). Negative for leg swelling.  Gastrointestinal: Negative for abdominal pain, diarrhea, nausea and vomiting.  Musculoskeletal: Negative for back pain.  Neurological: Negative for dizziness, syncope, weakness, light-headedness and numbness.  All other systems reviewed and are negative.    Physical Exam Updated Vital Signs BP 128/86 (BP Location: Right Arm)   Pulse 71   Temp 98.2 F (36.8 C) (Oral)   Resp 18   SpO2 100%   Physical Exam Vitals signs and nursing note reviewed.  Constitutional:      General: He is not in acute distress.    Appearance: He is well-developed. He is not diaphoretic.  HENT:     Head: Normocephalic and atraumatic.     Mouth/Throat:     Mouth: Mucous membranes are moist.     Pharynx: Oropharynx is clear.  Eyes:     Conjunctiva/sclera: Conjunctivae normal.  Neck:     Musculoskeletal: Neck supple.  Cardiovascular:     Rate and Rhythm: Normal rate and regular rhythm.     Pulses: Normal pulses.          Radial pulses are 2+ on the right side and 2+ on the left side.       Posterior tibial pulses are 2+ on the right side and 2+ on the left side.     Heart sounds: Normal heart sounds.     Comments: Tactile temperature in the extremities appropriate and equal bilaterally. Pulmonary:     Effort: Pulmonary effort is normal. No respiratory distress.     Breath sounds: Normal breath sounds.  Chest:     Chest wall: Tenderness present. No mass, deformity, swelling, crepitus or edema.       Comments: No lesions, erythema, or other abnormality noted to the skin of the chest. Abdominal:     Palpations: Abdomen is soft.     Tenderness: There is no abdominal tenderness. There is no guarding.  Musculoskeletal:     Right lower leg: No edema.     Left lower leg: No edema.  Lymphadenopathy:     Cervical: No cervical adenopathy.  Skin:    General: Skin is warm and dry.  Neurological:     Mental Status: He is alert.     Comments: Sensation grossly intact to  light touch in the extremities.  Grip strengths equal bilaterally.  Strength 5/5 in all extremities. No gait disturbance. Coordination intact. Cranial nerves III-XII grossly intact. No facial droop.   Psychiatric:        Mood and Affect: Mood and affect normal.        Speech: Speech normal.        Behavior: Behavior normal.      ED Treatments / Results  Labs (all labs ordered are listed, but only abnormal results are displayed) Labs Reviewed  BASIC METABOLIC PANEL - Abnormal; Notable for the following components:  Result Value   Glucose, Bld 103 (*)    All other components within normal limits  CBC  TROPONIN I (HIGH SENSITIVITY)  TROPONIN I (HIGH SENSITIVITY)    EKG EKG Interpretation  Date/Time:  Sunday June 07 2019 01:28:27 EST Ventricular Rate:  59 PR Interval:  120 QRS Duration: 74 QT Interval:  396 QTC Calculation: 392 R Axis:   -59 Text Interpretation: Sinus bradycardia with Premature supraventricular complexes Left axis deviation Pulmonary disease pattern ST & T wave abnormality, consider inferior ischemia ST & T wave abnormality, consider anterolateral ischemia Abnormal ECG When compared with ECG of 06/04/2019, No significant change was found Confirmed by Delora Fuel ([id number]) on 06/07/2019 2:39:13 AM   Radiology Dg Chest 2 View  Result Date: 06/07/2019 CLINICAL DATA:  Chest pain for 1 hour EXAM: CHEST - 2 VIEW COMPARISON:  06/04/2019 FINDINGS: The heart size and mediastinal contours are within normal limits. Both lungs are clear. The visualized skeletal structures are unremarkable. IMPRESSION: No active cardiopulmonary disease. Electronically Signed   By: [name] M.D.   On: 06/07/2019 01:40    Procedures Procedures (including critical care time)  Medications Ordered in ED Medications  sodium chloride flush (NS) 0.9 % injection 3 mL (has no administration in time range)     Initial Impression / Assessment and Plan / ED Course  I have reviewed  the triage vital signs and the nursing notes.  Pertinent labs & imaging results that were available during my care of the patient were reviewed by me and considered in my medical decision making (see chart for details).        Low suspicion for ACS.  HEART score is 1, indicating low risk for a cardiac event.  EKG with inferior and lateral ST and T wave abnormalities, previously noted earlier this week.  Delta troponins negative.  Chest x-ray without acute abnormality. Wells criteria score is 0, indicating low risk for PE.  PERC negative. Dissection was considered, but thought less likely base on: History, age, and description and recurrence of the pain are not suggestive, patient is not ill-appearing, lack of risk factors, equal bilateral pulses, lack of neurologic deficits, no widened mediastinum on chest x-ray. Patient to follow-up with cardiology on this matter. Strict return precautions discussed. Patient voices understanding of these instructions, accepts the plan, and is comfortable with discharge.  Findings and plan of care discussed with Delora Fuel, MD.   Vitals:   06/07/19 0121 06/07/19 0645 06/07/19 0648 06/07/19 0648  BP: 119/79  128/86 128/86  Pulse: (!) 58 73 65 71  Resp: 18 15 19 18   Temp: 98.2 F (36.8 C)     TempSrc: Oral     SpO2: 98% 100% 100% 100%     Final Clinical Impressions(s) / ED Diagnoses   Final diagnoses:  Atypical chest pain    ED Discharge Orders    None       Layla Maw 70/62/37 [id number]    Delora Fuel, MD 15/17/61 0745

## 2019-06-07 NOTE — ED Triage Notes (Signed)
Per [facility], pt from home w/ a c/o CP that began ~1 hour ago. Pt has been having CP intermittently for a year. Episodes have been more frequent recently. CP is centrally located and radiates into the left side of his chest. Feels sharp in nature. Reproducible upon palpation and increases with deep inspiration. Denies N/V, LOC, or SOB. Self-administered 325 mg ASA prior to EMS arrival.

## 2019-06-07 NOTE — Discharge Instructions (Addendum)
There were EKG abnormalities noted on several of your EKGs.  The rest of the work-up was reassuring. Please follow-up with cardiology on this matter.  Call the number provided to set up an appointment.  Due to the tenderness on exam today, you may try the following: Antiinflammatory medications: Take 600 mg of ibuprofen every 6 hours or 440 mg (over the counter dose) to 500 mg (prescription dose) of naproxen every 12 hours for the next 3 days. After this time, these medications may be used as needed for pain. Take these medications with food to avoid upset stomach. Choose only one of these medications, do not take them together. Acetaminophen (generic for Tylenol): Should you continue to have additional pain while taking the ibuprofen or naproxen, you may add in acetaminophen as needed. Your daily total maximum amount of acetaminophen from all sources should be limited to 4000mg /day for persons without liver problems, or 2000mg /day for those with liver problems.

## 2019-06-20 DIAGNOSIS — R9431 Abnormal electrocardiogram [ECG] [EKG]: Secondary | ICD-10-CM | POA: Insufficient documentation

## 2019-06-20 DIAGNOSIS — R072 Precordial pain: Secondary | ICD-10-CM | POA: Insufficient documentation

## 2019-06-20 NOTE — Progress Notes (Signed)
Cardiology Office Note   Date:  06/22/2019   ID:  KAITLYN SKOWRON, DOB 06-25-1986, MRN [id number]  PCP:  Patient, No Pcp Per  Cardiologist:   No primary care provider on file. Referring:  ED  Chief Complaint  Patient presents with  . Chest Pain      History of Present Illness: Steve Flores is a 33 y.o. male who presents for evaluation of chest pain.  He was in the ED earlier this month with chest pain.  I reviewed these records for this visit.    He has no past cardiac history other than a report of an abnormal EKG.  He was told this many had an EKG 1 time when he was helping a friend out with her nursing class.  He was also told this when he presented for a study at [facility].  He did mention chest discomfort and had been told to go to the emergency room.  He did go to the emergency room on the 15th of this month.  He had been having chest discomfort for probably a few days.  However, high-sensitivity troponin was negative.  He did have T wave inversion in the inferior leads.  I reviewed this was unchanged from 2018.  He has never had any stress testing or other cardiovascular testing.  He does some walking for exercise and does not bring on the discomfort with this.  However, he does get severe 8 out of 10 discomfort when he has anxiety.  It might happen sometimes at rest without anxiety.  Typically he has to try to relax and it might last for hours or days.  It is left and under his breast.  There is no associated nausea vomiting or diaphoresis.  He might get some shortness of breath.  He is not describing PND or orthopnea.  He does do some activities on his job.  However, to a relatively sedentary job.   Past Medical History:  Diagnosis Date  . Abrasion of finger of right hand 12/28/2011   healing wound right index finger  . Fracture of phalanx of finger 12/26/2011   right ring prox. phalanx  . Inguinal hernia     Past Surgical History:  Procedure Laterality Date  . INGUINAL HERNIA  REPAIR Left 10/07/2015   Procedure: LAPAROSCOPIC LEFT  INGUINAL HERNIA;  Surgeon: Axel Filler, MD;  Location: [facility];  Service: General;  Laterality: Left;  . INSERTION OF MESH Left 10/07/2015   Procedure: INSERTION OF MESH;  Surgeon: Axel Filler, MD;  Location: [facility];  Service: General;  Laterality: Left;     Current Outpatient Medications  Medication Sig Dispense Refill  . acetaminophen (TYLENOL) 325 MG tablet Take 650 mg by mouth every 6 (six) hours as needed for mild pain.    . cyclobenzaprine (FLEXERIL) 10 MG tablet Take 1 tablet (10 mg total) by mouth 2 (two) times daily as needed for muscle spasms. 20 tablet 0  . naproxen (NAPROSYN) 500 MG tablet Take 1 tablet (500 mg total) by mouth 2 (two) times daily. 30 tablet 0  . predniSONE (STERAPRED UNI-PAK 21 TAB) 10 MG (21) TBPK tablet Take by mouth daily. Take as directed. 21 tablet 0  . traMADol (ULTRAM) 50 MG tablet Take 1 tablet (50 mg total) by mouth every 6 (six) hours as needed. 15 tablet 0   No current facility-administered medications for this visit.     Allergies:   Patient has no known allergies.    Social  History:  The patient  reports that he has never smoked. He has never used smokeless tobacco. He reports current alcohol use. He reports that he does not use drugs.   Family History:  The patient's family history includes Sickle cell anemia in his father.  His mother died of a motor vehicle accident.  There is no early heart disease.   ROS:  Please see the history of present illness.   Otherwise, review of systems are positive for hands swelling, neck stiffness, cramping, questionable carpal tunnel.   All other systems are reviewed and negative.    PHYSICAL EXAM: VS:  BP 123/76   Pulse 76   Ht 5\' 9"  (1.753 m)   Wt 179 lb 12.8 oz (81.6 kg)   SpO2 99%   BMI 26.55 kg/m  , BMI Body mass index is 26.55 kg/m. GENERAL:  Well appearing HEENT:  Pupils equal round and reactive, fundi not visualized, oral mucosa  unremarkable NECK:  No jugular venous distention, waveform within normal limits, carotid upstroke brisk and symmetric, no bruits, no thyromegaly LYMPHATICS:  No cervical, inguinal adenopathy LUNGS:  Clear to auscultation bilaterally BACK:  No CVA tenderness CHEST:  Unremarkable HEART:  PMI not displaced or sustained,S1 and S2 within normal limits, no S3, no S4, no clicks, no rubs, no murmurs ABD:  Flat, positive bowel sounds normal in frequency in pitch, no bruits, no rebound, no guarding, no midline pulsatile mass, no hepatomegaly, no splenomegaly EXT:  2 plus pulses throughout, no edema, no cyanosis no clubbing SKIN:  No rashes no nodules NEURO:  Cranial nerves II through XII grossly intact, motor grossly intact throughout PSYCH:  Cognitively intact, oriented to person place and time    EKG:  EKG is ordered today. The ekg ordered today demonstrates sinus rhythm, rate 76, left axis deviation, inferior T wave inversions unchanged from 2018.   Recent Labs: 06/07/2019: BUN 14; Creatinine, Ser 1.06; Hemoglobin 13.7; Platelets 238; Potassium 4.0; Sodium 137    Lipid Panel No results found for: CHOL, TRIG, HDL, CHOLHDL, VLDL, LDLCALC, LDLDIRECT    Wt Readings from Last 3 Encounters:  06/22/19 179 lb 12.8 oz (81.6 kg)  06/04/19 172 lb (78 kg)  07/21/17 170 lb (77.1 kg)      Other studies Reviewed: Additional studies/ records that were reviewed today include: ED records. Review of the above records demonstrates:  Please see elsewhere in the note.     ASSESSMENT AND PLAN:  CHEST PAIN:   His chest pain is atypical.  However, he does have the abnormal EKG. I will bring the patient back for a POET (Plain Old Exercise Test). This will allow me to screen for obstructive coronary disease, risk stratify and very importantly provide a prescription for exercise.  If this is normal I will strongly suspect a cardiac etiology.  ABNORMAL EKG: This will be evaluated as above.  I might  consider  echocardiography pending the results of the above.  Current medicines are reviewed at length with the patient today.  The patient does not have concerns regarding medicines.  The following changes have been made:  no change  Labs/ tests ordered today include:   Orders Placed This Encounter  Procedures  . EXERCISE TOLERANCE TEST (ETT)  . EKG 12-Lead     Disposition:   FU with me in one month.     Signed, Minus Breeding, MD  06/22/2019 1:08 PM    [place]

## 2019-06-22 ENCOUNTER — Other Ambulatory Visit: Payer: Self-pay

## 2019-06-22 ENCOUNTER — Encounter: Payer: Self-pay | Admitting: Cardiology

## 2019-06-22 ENCOUNTER — Ambulatory Visit (INDEPENDENT_AMBULATORY_CARE_PROVIDER_SITE_OTHER): Payer: Self-pay | Admitting: Cardiology

## 2019-06-22 VITALS — BP 123/76 | HR 76 | Ht 69.0 in | Wt 179.8 lb

## 2019-06-22 DIAGNOSIS — R9431 Abnormal electrocardiogram [ECG] [EKG]: Secondary | ICD-10-CM

## 2019-06-22 DIAGNOSIS — R072 Precordial pain: Secondary | ICD-10-CM

## 2019-06-22 NOTE — Patient Instructions (Addendum)
Medication Instructions:  Your physician recommends that you continue on your current medications as directed. Please refer to the Current Medication list given to you today.  If you need a refill on your cardiac medications before your next appointment, please call your pharmacy.   Lab work: NONE  Testing/Procedures: Your physician has requested that you have an exercise tolerance test. For further information please visit [email or web address]. Please also follow instruction sheet, as given.   Follow-Up: At [facility], you and your health needs are our priority.  As part of our continuing mission to provide you with exceptional heart care, we have created designated Provider Care Teams.  These Care Teams include your primary Cardiologist (physician) and Advanced Practice Providers (APPs -  Physician Assistants and Nurse Practitioners) who all work together to provide you with the care you need, when you need it. You may see Dr Percival Spanish or one of the following Advanced Practice Providers on your designated Care Team:    Rosaria Ferries, PA-C  Jory Sims, DNP, ANP  Cadence Kathlen Mody, NP  Your physician wants you to follow-up in: 1 month,  Any Other Special Instructions Will Be Listed Below (If Applicable). You have yo be tested for Covid prior to your treadmill test and quarantine for 3 days.

## 2019-07-28 ENCOUNTER — Inpatient Hospital Stay (HOSPITAL_COMMUNITY): Admission: RE | Admit: 2019-07-28 | Payer: Self-pay | Source: Ambulatory Visit

## 2019-07-29 ENCOUNTER — Telehealth (HOSPITAL_COMMUNITY): Payer: Self-pay

## 2019-07-29 NOTE — Telephone Encounter (Signed)
Encounter complete. 

## 2019-07-30 ENCOUNTER — Telehealth (HOSPITAL_COMMUNITY): Payer: Self-pay

## 2019-07-30 ENCOUNTER — Telehealth: Payer: Self-pay | Admitting: Cardiology

## 2019-07-30 NOTE — Telephone Encounter (Signed)
Left message for patient to call and reschedule 07/31/19 GXT----did not have COVID testing as scheduled

## 2019-07-31 ENCOUNTER — Inpatient Hospital Stay (HOSPITAL_COMMUNITY): Admission: RE | Admit: 2019-07-31 | Payer: Self-pay | Source: Ambulatory Visit

## 2019-07-31 NOTE — Telephone Encounter (Signed)
Left message for patient to call and reschedule ETT and COVID testing

## 2019-08-04 ENCOUNTER — Encounter: Payer: Self-pay | Admitting: Cardiology

## 2019-08-04 NOTE — Telephone Encounter (Signed)
Patient has not returned calls to reschedule 07/31/19 ETT and  1/5//21 COVID testing.  Will mail letter requesting patient to call and reschedule.

## 2019-08-06 DIAGNOSIS — Z7189 Other specified counseling: Secondary | ICD-10-CM | POA: Insufficient documentation

## 2019-08-06 NOTE — Progress Notes (Deleted)
Cardiology Office Note   Date:  08/06/2019   ID:  Steve Flores, DOB 1985-11-23, MRN [id number]  PCP:  Patient, No Pcp Per  Cardiologist:   No primary care provider on file. Referring:  ED  No chief complaint on file.     History of Present Illness: Steve Flores is a 34 y.o. male who presents for evaluation of chest pain.  I saw him after an ED visit.  However, he canceled his stress test.  ***   He was in the ED earlier this month with chest pain.  I reviewed these records for this visit.    He has no past cardiac history other than a report of an abnormal EKG.  He was told this many had an EKG 1 time when he was helping a friend out with her nursing class.  He was also told this when he presented for a study at [facility].  He did mention chest discomfort and had been told to go to the emergency room.  He did go to the emergency room on the 15th of this month.  He had been having chest discomfort for probably a few days.  However, high-sensitivity troponin was negative.  He did have T wave inversion in the inferior leads.  I reviewed this was unchanged from 2018.  He has never had any stress testing or other cardiovascular testing.  He does some walking for exercise and does not bring on the discomfort with this.  However, he does get severe 8 out of 10 discomfort when he has anxiety.  It might happen sometimes at rest without anxiety.  Typically he has to try to relax and it might last for hours or days.  It is left and under his breast.  There is no associated nausea vomiting or diaphoresis.  He might get some shortness of breath.  He is not describing PND or orthopnea.  He does do some activities on his job.  However, to a relatively sedentary job.   Past Medical History:  Diagnosis Date  . Abrasion of finger of right hand 12/28/2011   healing wound right index finger  . Fracture of phalanx of finger 12/26/2011   right ring prox. phalanx  . Inguinal hernia     Past Surgical History:    Procedure Laterality Date  . INGUINAL HERNIA REPAIR Left 10/07/2015   Procedure: LAPAROSCOPIC LEFT  INGUINAL HERNIA;  Surgeon: Axel Filler, MD;  Location: [facility];  Service: General;  Laterality: Left;  . INSERTION OF MESH Left 10/07/2015   Procedure: INSERTION OF MESH;  Surgeon: Axel Filler, MD;  Location: [facility];  Service: General;  Laterality: Left;     Current Outpatient Medications  Medication Sig Dispense Refill  . acetaminophen (TYLENOL) 325 MG tablet Take 650 mg by mouth every 6 (six) hours as needed for mild pain.    . cyclobenzaprine (FLEXERIL) 10 MG tablet Take 1 tablet (10 mg total) by mouth 2 (two) times daily as needed for muscle spasms. 20 tablet 0  . naproxen (NAPROSYN) 500 MG tablet Take 1 tablet (500 mg total) by mouth 2 (two) times daily. 30 tablet 0  . predniSONE (STERAPRED UNI-PAK 21 TAB) 10 MG (21) TBPK tablet Take by mouth daily. Take as directed. 21 tablet 0  . traMADol (ULTRAM) 50 MG tablet Take 1 tablet (50 mg total) by mouth every 6 (six) hours as needed. 15 tablet 0   No current facility-administered medications for this visit.  Allergies:   Patient has no known allergies.    ROS:  Please see the history of present illness.   Otherwise, review of systems are positive for hands swelling, neck stiffness, cramping, questionable carpal tunnel.   All other systems are reviewed and negative.    PHYSICAL EXAM: VS:  There were no vitals taken for this visit. , BMI There is no height or weight on file to calculate BMI. GENERAL:  Well appearing NECK:  No jugular venous distention, waveform within normal limits, carotid upstroke brisk and symmetric, no bruits, no thyromegaly LUNGS:  Clear to auscultation bilaterally CHEST:  Unremarkable HEART:  PMI not displaced or sustained,S1 and S2 within normal limits, no S3, no S4, no clicks, no rubs, *** murmurs ABD:  Flat, positive bowel sounds normal in frequency in pitch, no bruits, no rebound, no guarding, no  midline pulsatile mass, no hepatomegaly, no splenomegaly EXT:  2 plus pulses throughout, no edema, no cyanosis no clubbing   ***GENERAL:  Well appearing HEENT:  Pupils equal round and reactive, fundi not visualized, oral mucosa unremarkable NECK:  No jugular venous distention, waveform within normal limits, carotid upstroke brisk and symmetric, no bruits, no thyromegaly LYMPHATICS:  No cervical, inguinal adenopathy LUNGS:  Clear to auscultation bilaterally BACK:  No CVA tenderness CHEST:  Unremarkable HEART:  PMI not displaced or sustained,S1 and S2 within normal limits, no S3, no S4, no clicks, no rubs, no murmurs ABD:  Flat, positive bowel sounds normal in frequency in pitch, no bruits, no rebound, no guarding, no midline pulsatile mass, no hepatomegaly, no splenomegaly EXT:  2 plus pulses throughout, no edema, no cyanosis no clubbing SKIN:  No rashes no nodules NEURO:  Cranial nerves II through XII grossly intact, motor grossly intact throughout PSYCH:  Cognitively intact, oriented to person place and time    EKG:  EKG is *** ordered today. The ekg ordered today demonstrates sinus rhythm, rate ***, left axis deviation, inferior T wave inversions unchanged from 2018.   Recent Labs: 06/07/2019: BUN 14; Creatinine, Ser 1.06; Hemoglobin 13.7; Platelets 238; Potassium 4.0; Sodium 137    Lipid Panel No results found for: CHOL, TRIG, HDL, CHOLHDL, VLDL, LDLCALC, LDLDIRECT    Wt Readings from Last 3 Encounters:  06/22/19 179 lb 12.8 oz (81.6 kg)  06/04/19 172 lb (78 kg)  07/21/17 170 lb (77.1 kg)      Other studies Reviewed: Additional studies/ records that were reviewed today include: *** Review of the above records demonstrates:  ***   ASSESSMENT AND PLAN:  CHEST PAIN:   *** His chest pain is atypical.  However, he does have the abnormal EKG. I will bring the patient back for a POET (Plain Old Exercise Test). This will allow me to screen for obstructive coronary disease,  risk stratify and very importantly provide a prescription for exercise.  If this is normal I will strongly suspect a cardiac etiology.  ABNORMAL EKG:  *** This will be evaluated as above.  I might  consider echocardiography pending the results of the above.  COVID EDUCATION:  ***   Current medicines are reviewed at length with the patient today.  The patient does not have concerns regarding medicines.  The following changes have been made:  ***  Labs/ tests ordered today include: ***  No orders of the defined types were placed in this encounter.    Disposition:   FU with me in ***   Signed, Minus Breeding, MD  08/06/2019 8:22 PM  [facility]

## 2019-08-07 ENCOUNTER — Ambulatory Visit: Payer: Self-pay | Admitting: Cardiology

## 2019-09-01 NOTE — Telephone Encounter (Signed)
Encounter complete. 

## 2019-10-05 ENCOUNTER — Encounter: Payer: Self-pay | Admitting: Cardiology

## 2019-10-10 ENCOUNTER — Ambulatory Visit (HOSPITAL_COMMUNITY)
Admission: EM | Admit: 2019-10-10 | Discharge: 2019-10-10 | Disposition: A | Discharge status: Home / Self Care | Payer: Self-pay | Attending: Family Medicine | Admitting: Family Medicine

## 2019-10-10 ENCOUNTER — Other Ambulatory Visit: Payer: Self-pay

## 2019-10-10 ENCOUNTER — Encounter (HOSPITAL_COMMUNITY): Payer: Self-pay

## 2019-10-10 DIAGNOSIS — M79641 Pain in right hand: Secondary | ICD-10-CM

## 2019-10-10 DIAGNOSIS — Z23 Encounter for immunization: Secondary | ICD-10-CM

## 2019-10-10 DIAGNOSIS — L0103 Bullous impetigo: Secondary | ICD-10-CM

## 2019-10-10 MED ORDER — SULFAMETHOXAZOLE-TRIMETHOPRIM 800-160 MG PO TABS: 1.0000 | ORAL_TABLET | Freq: Two times a day (BID) | ORAL | 0 refills | Status: AC

## 2019-10-10 MED ORDER — TETANUS-DIPHTH-ACELL PERTUSSIS 5-2.5-18.5 LF-MCG/0.5 IM SUSP
INTRAMUSCULAR | Status: AC
  Filled 2019-10-10: qty 0.5

## 2019-10-10 MED ORDER — TETANUS-DIPHTH-ACELL PERTUSSIS 5-2.5-18.5 LF-MCG/0.5 IM SUSP
0.5000 mL | Freq: Once | INTRAMUSCULAR | Status: AC
  Administered 2019-10-10: 0.5 mL via INTRAMUSCULAR

## 2019-10-10 NOTE — ED Triage Notes (Signed)
Patient states that he is here for a blister like area to his right index finger that is causing his whole hand to swell. Patient states that this started on Wednesday. Patient states that he also has a similar area on his right shoulder with redness. States that this has happened once before a month ago.

## 2019-10-10 NOTE — Discharge Instructions (Addendum)
You have an infection called bullous impetigo. This can occur from bug bites or anywhere that there is an opening in the skin.   I have sent in an antibiotic for you to take. Take one tablet twice daily for 7 days. Take all of the medication, even if you are feeling better.   If you are not feeling better in 2 days, follow up for reevaluation.

## 2019-10-10 NOTE — ED Provider Notes (Signed)
[facility]    CSN: [id number] Arrival date & time: 10/10/19  1030      History   Chief Complaint Chief Complaint  Patient presents with  . Blister    HPI Steve Flores is a 34 y.o. male.   Patient complains of 2 "lumps" on his right deltoid.  He also complains of right hand pain with redness and swelling.  On that same and a large blister on his first finger.  Reports that this happened about a month ago, that he took naproxen and that the swelling went down.  He states that last month it was not as bad as it is today.  He states that symptoms started Thursday.  He has not made any attempts to treat at home.  Reports that he thinks he was maybe bitten by something.  Reports that he works at [employer] and does not use gloves, so he is concerned for infection.  Denies headache, fever, body aches, chills, nausea, vomiting, diarrhea, rash, other symptoms.  The history is provided by the patient.    Past Medical History:  Diagnosis Date  . Abrasion of finger of right hand 12/28/2011   healing wound right index finger  . Fracture of phalanx of finger 12/26/2011   right ring prox. phalanx  . Inguinal hernia     Patient Active Problem List   Diagnosis Date Noted  . Educated about COVID-19 virus infection 08/06/2019  . Precordial chest pain 06/20/2019  . Nonspecific abnormal electrocardiogram (ECG) (EKG) 06/20/2019  . Bilateral carpal tunnel syndrome 01/10/2018    Past Surgical History:  Procedure Laterality Date  . INGUINAL HERNIA REPAIR Left 10/07/2015   Procedure: LAPAROSCOPIC LEFT  INGUINAL HERNIA;  Surgeon: Axel Filler, MD;  Location: [facility];  Service: General;  Laterality: Left;  . INSERTION OF MESH Left 10/07/2015   Procedure: INSERTION OF MESH;  Surgeon: Axel Filler, MD;  Location: [facility];  Service: General;  Laterality: Left;       Home Medications    Prior to Admission medications   Medication Sig Start Date End Date Taking? Authorizing Provider    acetaminophen (TYLENOL) 325 MG tablet Take 650 mg by mouth every 6 (six) hours as needed for mild pain.   Yes [provider]  naproxen (NAPROSYN) 500 MG tablet Take 1 tablet (500 mg total) by mouth 2 (two) times daily. 03/06/19  Yes Wallis Bamberg, PA-C  cyclobenzaprine (FLEXERIL) 10 MG tablet Take 1 tablet (10 mg total) by mouth 2 (two) times daily as needed for muscle spasms. 07/21/17   Roxy Horseman, PA-C  predniSONE (STERAPRED UNI-PAK 21 TAB) 10 MG (21) TBPK tablet Take by mouth daily. Take as directed. 05/21/19   Mardella Layman, MD  sulfamethoxazole-trimethoprim (BACTRIM DS) 800-160 MG tablet Take 1 tablet by mouth 2 (two) times daily for 7 days. 10/10/19 10/17/19  Moshe Cipro, NP  traMADol (ULTRAM) 50 MG tablet Take 1 tablet (50 mg total) by mouth every 6 (six) hours as needed. 05/21/19   Mardella Layman, MD  omeprazole (PRILOSEC) 20 MG capsule Take 1 capsule (20 mg total) by mouth 2 (two) times daily. Patient not taking: Reported on 07/21/2017 01/21/17 03/06/19  Rolland Porter, MD  sucralfate (CARAFATE) 1 g tablet Take 1 tablet (1 g total) by mouth 4 (four) times daily. Patient not taking: Reported on 07/21/2017 01/21/17 03/06/19  Rolland Porter, MD    Family History Family History  Problem Relation Age of Onset  . Sickle cell anemia Father  Social History Social History   Tobacco Use  . Smoking status: Never Smoker  . Smokeless tobacco: Never Used  Substance Use Topics  . Alcohol use: Yes    Comment: occasionally  . Drug use: No     Allergies   Patient has no known allergies.   Review of Systems Review of Systems  Constitutional: Negative for activity change, chills and fever.  HENT: Negative.  Negative for congestion, ear pain and sore throat.   Eyes: Negative.  Negative for pain and visual disturbance.  Respiratory: Negative.  Negative for cough, shortness of breath and stridor.   Cardiovascular: Negative for chest pain and palpitations.  Gastrointestinal:  Negative for abdominal distention, abdominal pain and vomiting.  Genitourinary: Negative for dysuria and hematuria.  Musculoskeletal: Positive for joint swelling. Negative for arthralgias and back pain.       Joint swelling to right thumb and first finger  Skin: Negative for color change and rash.  Neurological: Negative for seizures and syncope.  Psychiatric/Behavioral: Negative.   All other systems reviewed and are negative.    Physical Exam Triage Vital Signs ED Triage Vitals  Enc Vitals Group     BP 10/10/19 1045 135/76     Pulse Rate 10/10/19 1045 76     Resp 10/10/19 1045 18     Temp 10/10/19 1045 98.6 F (37 C)     Temp Source 10/10/19 1045 Oral     SpO2 10/10/19 1045 97 %     Weight 10/10/19 1047 175 lb (79.4 kg)     Height 10/10/19 1047 5\' 9"  (1.753 m)     Head Circumference --      Peak Flow --      Pain Score 10/10/19 1046 3     Pain Loc --      Pain Edu? --      Excl. in [place]? --    No data found.  Updated Vital Signs BP 135/76 (BP Location: Right Arm)   Pulse 76   Temp 98.6 F (37 C) (Oral)   Resp 18   Ht 5\' 9"  (1.753 m)   Wt 175 lb (79.4 kg)   SpO2 97%   BMI 25.84 kg/m   Visual Acuity Right Eye Distance:   Left Eye Distance:   Bilateral Distance:    Right Eye Near:   Left Eye Near:    Bilateral Near:     Physical Exam Vitals and nursing note reviewed.  Constitutional:      General: He is not in acute distress.    Appearance: Normal appearance. He is well-developed and normal weight.  HENT:     Head: Normocephalic and atraumatic.     Right Ear: Tympanic membrane normal.     Left Ear: Tympanic membrane normal.     Nose: Nose normal.     Mouth/Throat:     Mouth: Mucous membranes are moist.     Pharynx: Oropharynx is clear.  Eyes:     Conjunctiva/sclera: Conjunctivae normal.  Cardiovascular:     Rate and Rhythm: Normal rate and regular rhythm.     Heart sounds: Normal heart sounds. No murmur.  Pulmonary:     Effort: Pulmonary effort is  normal. No respiratory distress.     Breath sounds: Normal breath sounds. No stridor. No wheezing, rhonchi or rales.  Chest:     Chest wall: No tenderness.  Abdominal:     General: Bowel sounds are normal. There is no distension.     Palpations: Abdomen  is soft. There is no mass.     Tenderness: There is no abdominal tenderness. There is no guarding or rebound.     Hernia: No hernia is present.  Musculoskeletal:        General: Swelling and tenderness present.     Cervical back: Normal range of motion and neck supple.  Skin:    General: Skin is warm and dry.     Capillary Refill: Capillary refill takes less than 2 seconds.  Neurological:     General: No focal deficit present.     Mental Status: He is alert and oriented to person, place, and time.  Psychiatric:        Mood and Affect: Mood normal.        Behavior: Behavior normal.      UC Treatments / Results  Labs (all labs ordered are listed, but only abnormal results are displayed) Labs Reviewed - No data to display  EKG   Radiology No results found.  Procedures Procedures (including critical care time)  Medications Ordered in UC Medications  Tdap (BOOSTRIX) injection 0.5 mL (0.5 mLs Intramuscular Given 10/10/19 1121)    Initial Impression / Assessment and Plan / UC Course  I have reviewed the triage vital signs and the nursing notes.  Pertinent labs & imaging results that were available during my care of the patient were reviewed by me and considered in my medical decision making (see chart for details).     Likely bullous impetigo.  2 x 2 cm vesicular blister present to right first finger between PIP and MIP joints.  Right hand is tender, erythematous, swelling.  Also has 2 areas of induration to the right shoulder.  These areas are both about 2 x 1 cm in area.  These areas seem to have been draining honey colored crust. Instructed not to open the blister that is on his first finger. Prescribed Bactrim twice  daily x7 days.  Instructed that if swelling and pain has not decreased over the next 2 days for him to come back into the office for reevaluation.  Instructed to go to the ER for loss of sensation, streaking noted up the right arm from the hand, trouble swallowing, trouble breathing, other concerning symptoms. Final Clinical Impressions(s) / UC Diagnoses   Final diagnoses:  Bullous impetigo  Pain of right hand     Discharge Instructions     You have an infection called bullous impetigo. This can occur from bug bites or anywhere that there is an opening in the skin.   I have sent in an antibiotic for you to take. Take one tablet twice daily for 7 days. Take all of the medication, even if you are feeling better.   If you are not feeling better in 2 days, follow up for reevaluation.    ED Prescriptions    Medication Sig Dispense Auth. Provider   sulfamethoxazole-trimethoprim (BACTRIM DS) 800-160 MG tablet Take 1 tablet by mouth 2 (two) times daily for 7 days. 14 tablet Faustino Congress, NP     I have reviewed the PDMP during this encounter.   Faustino Congress, NP 10/10/19 1300

## 2019-10-29 ENCOUNTER — Other Ambulatory Visit: Payer: Self-pay

## 2019-10-29 ENCOUNTER — Ambulatory Visit (HOSPITAL_COMMUNITY)
Admission: EM | Admit: 2019-10-29 | Discharge: 2019-10-29 | Disposition: A | Discharge status: Home / Self Care | Payer: Self-pay | Attending: Family Medicine | Admitting: Family Medicine

## 2019-10-29 ENCOUNTER — Encounter (HOSPITAL_COMMUNITY): Payer: Self-pay

## 2019-10-29 DIAGNOSIS — L299 Pruritus, unspecified: Secondary | ICD-10-CM | POA: Insufficient documentation

## 2019-10-29 LAB — CBC WITH DIFFERENTIAL/PLATELET
Abs Immature Granulocytes: 0.01 10*3/uL (ref 0.00–0.07)
Basophils Absolute: 0 10*3/uL (ref 0.0–0.1)
Basophils Relative: 1 %
Eosinophils Absolute: 0.5 10*3/uL (ref 0.0–0.5)
Eosinophils Relative: 12 %
HCT: 43.5 % (ref 39.0–52.0)
Hemoglobin: 15 g/dL (ref 13.0–17.0)
Immature Granulocytes: 0 %
Lymphocytes Relative: 27 %
Lymphs Abs: 1.2 10*3/uL (ref 0.7–4.0)
MCH: 27.3 pg (ref 26.0–34.0)
MCHC: 34.5 g/dL (ref 30.0–36.0)
MCV: 79.1 fL — ABNORMAL LOW (ref 80.0–100.0)
Monocytes Absolute: 0.6 10*3/uL (ref 0.1–1.0)
Monocytes Relative: 14 %
Neutro Abs: 2 10*3/uL (ref 1.7–7.7)
Neutrophils Relative %: 46 %
Platelets: 257 10*3/uL (ref 150–400)
RBC: 5.5 MIL/uL (ref 4.22–5.81)
RDW: 14.6 % (ref 11.5–15.5)
WBC: 4.3 10*3/uL (ref 4.0–10.5)
nRBC: 0 % (ref 0.0–0.2)

## 2019-10-29 LAB — COMPREHENSIVE METABOLIC PANEL
ALT: 19 U/L (ref 0–44)
AST: 30 U/L (ref 15–41)
Albumin: 3.4 g/dL — ABNORMAL LOW (ref 3.5–5.0)
Alkaline Phosphatase: 55 U/L (ref 38–126)
Anion gap: 9 (ref 5–15)
BUN: 13 mg/dL (ref 6–20)
CO2: 28 mmol/L (ref 22–32)
Calcium: 9.6 mg/dL (ref 8.9–10.3)
Chloride: 101 mmol/L (ref 98–111)
Creatinine, Ser: 1.13 mg/dL (ref 0.61–1.24)
GFR calc Af Amer: >60 mL/min (ref >60)
GFR calc non Af Amer: >60 mL/min (ref >60)
Glucose, Bld: 92 mg/dL (ref 70–99)
Potassium: 4.4 mmol/L (ref 3.5–5.1)
Sodium: 138 mmol/L (ref 135–145)
Total Bilirubin: 0.6 mg/dL (ref 0.3–1.2)
Total Protein: 8.3 g/dL — ABNORMAL HIGH (ref 6.5–8.1)

## 2019-10-29 LAB — RAPID HIV SCREEN (HIV 1/2 AB+AG)
HIV 1/2 Antibodies: NONREACTIVE
HIV-1 P24 Antigen - HIV24: NONREACTIVE

## 2019-10-29 LAB — TSH: TSH: 1.091 u[IU]/mL (ref 0.350–4.500)

## 2019-10-29 LAB — RPR: RPR Ser Ql: NONREACTIVE

## 2019-10-29 MED ORDER — HYDROXYZINE HCL 25 MG PO TABS: 25.0000 mg | ORAL_TABLET | Freq: Four times a day (QID) | ORAL | 0 refills | Status: AC

## 2019-10-29 NOTE — ED Triage Notes (Signed)
Pt presents with hives on different areas of hid body X 4 days from unknown source.

## 2019-10-29 NOTE — ED Notes (Signed)
Evaluated pt in waiting room. Pt states he thinks he may have been "bitten by something yesterday" and had a reaction. Pt stabel w/o any facial/lip swelling, absent of stridor or SOB; speaking full sentences w/o difficulty.

## 2019-10-29 NOTE — Discharge Instructions (Addendum)
We are drawing some blood work to check a few things We will call you with in a few hours with your results. I am giving you hydroxyzine for itching to use as needed.  This may make you drowsy. Make sure you are keeping your skin moisturized regularly All of your blood work is normal you may want a follow-up with primary care or with a allergy specialist

## 2019-11-01 NOTE — ED Provider Notes (Signed)
[facility]    CSN: [id number] Arrival date & time: 10/29/19  0830      History   Chief Complaint Chief Complaint  Patient presents with  . Allergic Reaction    HPI Steve Flores is a 34 y.o. male.   Patient is a 34 year old male presents today for generalized pruritus.  This is been constant, waxing waning over the past 4 days.  Denies any specific or associated rash with this itching.  He has had some chills.  Recently treated for infection.  Finished antibiotics approximately 1 week ago.     Denies any fever, joint pain. Denies any recent changes in lotions, detergents, foods or other possible irritants. No recent travel. Nobody else at home has the rash. Patient has been outside but denies any contact with plants or insects. No new foods or medications.   ROS per HPI      Past Medical History:  Diagnosis Date  . Abrasion of finger of right hand 12/28/2011   healing wound right index finger  . Fracture of phalanx of finger 12/26/2011   right ring prox. phalanx  . Inguinal hernia     Patient Active Problem List   Diagnosis Date Noted  . Educated about COVID-19 virus infection 08/06/2019  . Precordial chest pain 06/20/2019  . Nonspecific abnormal electrocardiogram (ECG) (EKG) 06/20/2019  . Bilateral carpal tunnel syndrome 01/10/2018    Past Surgical History:  Procedure Laterality Date  . INGUINAL HERNIA REPAIR Left 10/07/2015   Procedure: LAPAROSCOPIC LEFT  INGUINAL HERNIA;  Surgeon: Axel Filler, MD;  Location: [facility];  Service: General;  Laterality: Left;  . INSERTION OF MESH Left 10/07/2015   Procedure: INSERTION OF MESH;  Surgeon: Axel Filler, MD;  Location: [facility];  Service: General;  Laterality: Left;       Home Medications    Prior to Admission medications   Medication Sig Start Date End Date Taking? Authorizing Provider  acetaminophen (TYLENOL) 325 MG tablet Take 650 mg by mouth every 6 (six) hours as needed for mild pain.    [provider]  hydrOXYzine (ATARAX/VISTARIL) 25 MG tablet Take 1 tablet (25 mg total) by mouth every 6 (six) hours. 10/29/19   Dahlia Byes A, NP  naproxen (NAPROSYN) 500 MG tablet Take 1 tablet (500 mg total) by mouth 2 (two) times daily. 03/06/19   Wallis Bamberg, PA-C  omeprazole (PRILOSEC) 20 MG capsule Take 1 capsule (20 mg total) by mouth 2 (two) times daily. Patient not taking: Reported on 07/21/2017 01/21/17 03/06/19  Rolland Porter, MD  sucralfate (CARAFATE) 1 g tablet Take 1 tablet (1 g total) by mouth 4 (four) times daily. Patient not taking: Reported on 07/21/2017 01/21/17 03/06/19  Rolland Porter, MD    Family History Family History  Problem Relation Age of Onset  . Sickle cell anemia Father     Social History Social History   Tobacco Use  . Smoking status: Never Smoker  . Smokeless tobacco: Never Used  Substance Use Topics  . Alcohol use: Yes    Comment: occasionally  . Drug use: No     Allergies   Patient has no known allergies.   Review of Systems Review of Systems   Physical Exam Triage Vital Signs ED Triage Vitals  Enc Vitals Group     BP 10/29/19 0845 111/76     Pulse Rate 10/29/19 0845 77     Resp 10/29/19 0845 18     Temp 10/29/19 0845 98.6 F (37  C)     Temp Source 10/29/19 0845 Oral     SpO2 10/29/19 0845 98 %     Weight --      Height --      Head Circumference --      Peak Flow --      Pain Score 10/29/19 0846 0     Pain Loc --      Pain Edu? --      Excl. in [place]? --    No data found.  Updated Vital Signs BP 111/76 (BP Location: Right Arm)   Pulse 77   Temp 98.6 F (37 C) (Oral)   Resp 18   SpO2 98%   Visual Acuity Right Eye Distance:   Left Eye Distance:   Bilateral Distance:    Right Eye Near:   Left Eye Near:    Bilateral Near:     Physical Exam Vitals and nursing note reviewed.  Constitutional:      Appearance: Normal appearance.  HENT:     Head: Normocephalic and atraumatic.     Nose: Nose normal.     Mouth/Throat:      Pharynx: Oropharynx is clear.  Eyes:     Conjunctiva/sclera: Conjunctivae normal.  Pulmonary:     Effort: Pulmonary effort is normal.  Musculoskeletal:        General: Normal range of motion.     Cervical back: Normal range of motion.  Skin:    General: Skin is warm and dry.     Comments: No rash   Neurological:     Mental Status: He is alert.  Psychiatric:        Mood and Affect: Mood normal.      UC Treatments / Results  Labs (all labs ordered are listed, but only abnormal results are displayed) Labs Reviewed  CBC WITH DIFFERENTIAL/PLATELET - Abnormal; Notable for the following components:      Result Value   MCV 79.1 (*)    All other components within normal limits  COMPREHENSIVE METABOLIC PANEL - Abnormal; Notable for the following components:   Total Protein 8.3 (*)    Albumin 3.4 (*)    All other components within normal limits  TSH  RAPID HIV SCREEN (HIV 1/2 AB+AG)  RPR    EKG   Radiology No results found.  Procedures Procedures (including critical care time)  Medications Ordered in UC Medications - No data to display  Initial Impression / Assessment and Plan / UC Course  I have reviewed the triage vital signs and the nursing notes.  Pertinent labs & imaging results that were available during my care of the patient were reviewed by me and considered in my medical decision making (see chart for details).     Pruritus without rash-drawing some lab work to rule out cause of pruritus. Patient has no current or active rash Lab work pending Hydroxyzine as needed for itching Follow up as needed for continued or worsening symptoms   Lab work unremarkable Patient called and made aware of results. Recommended seeing an allergist for follow up.  Final Clinical Impressions(s) / UC Diagnoses   Final diagnoses:  Pruritus     Discharge Instructions     We are drawing some blood work to check a few things We will call you with in a few hours with  your results. I am giving you hydroxyzine for itching to use as needed.  This may make you drowsy. Make sure you are keeping your skin moisturized  regularly All of your blood work is normal you may want a follow-up with primary care or with a allergy specialist    ED Prescriptions    Medication Sig Dispense Auth. Provider   hydrOXYzine (ATARAX/VISTARIL) 25 MG tablet Take 1 tablet (25 mg total) by mouth every 6 (six) hours. 12 tablet Corby Villasenor A, NP     PDMP not reviewed this encounter.   Janace Aris, NP 11/01/19 1027

## 2020-04-25 DIAGNOSIS — M5412 Radiculopathy, cervical region: Secondary | ICD-10-CM | POA: Insufficient documentation

## 2020-04-25 DIAGNOSIS — M542 Cervicalgia: Secondary | ICD-10-CM | POA: Diagnosis present

## 2020-04-26 ENCOUNTER — Other Ambulatory Visit: Payer: Self-pay

## 2020-04-26 ENCOUNTER — Emergency Department (HOSPITAL_COMMUNITY): Payer: Managed Care, Other (non HMO)

## 2020-04-26 ENCOUNTER — Emergency Department (HOSPITAL_COMMUNITY)
Admission: EM | Admit: 2020-04-26 | Discharge: 2020-04-26 | Disposition: A | Discharge status: Home / Self Care | Payer: Managed Care, Other (non HMO) | Attending: Emergency Medicine | Admitting: Emergency Medicine

## 2020-04-26 DIAGNOSIS — M5412 Radiculopathy, cervical region: Secondary | ICD-10-CM

## 2020-04-26 MED ORDER — OXYCODONE-ACETAMINOPHEN 5-325 MG PO TABS
1.0000 | ORAL_TABLET | Freq: Once | ORAL | Status: AC
  Administered 2020-04-26: 1 via ORAL
  Filled 2020-04-26: qty 1

## 2020-04-26 NOTE — ED Triage Notes (Signed)
Neck pain right side neck that radiates down arm with numbness and tingling in right hand and fingers uncertain of how he injuried himself

## 2020-04-26 NOTE — Discharge Instructions (Addendum)
It is important for you to continue taking the medications as you are prescribed.  These medications may take several doses before you see improvement. Apply heating pad, stretch and massage the area as tolerated as well. You may eventually need to see a spine specialist which we have listed below.   Return to the ER if you start to experience worsening pain, injuries or falls, weakness or fever.

## 2020-04-26 NOTE — ED Provider Notes (Signed)
[facility]-EMERGENCY DEPT Provider Note   CSN: [id number] Arrival date & time: 04/25/20  2328     History Chief Complaint  Patient presents with  . Neck Pain    Steve Flores is a 34 y.o. male with a past medical history of cervical radiculopathy presenting to the ED with a chief complaint of neck pain.  For the past 2 days has been having aching right-sided neck pain rating down his right arm.  Reports associated paresthesias.  He denies any injury or fall but is concerned due to certain movements at work that his pain worsened.  He was prescribed prednisone yesterday via telemedicine provider and has been taking muscle relaxer for the past 2 days.  States that neither of these have been helping him.  Has also been taking Tylenol.  Denies any prior neck surgeries, numbness, chest pain, shortness of breath, fever or rash.  HPI     Past Medical History:  Diagnosis Date  . Abrasion of finger of right hand 12/28/2011   healing wound right index finger  . Fracture of phalanx of finger 12/26/2011   right ring prox. phalanx  . Inguinal hernia     Patient Active Problem List   Diagnosis Date Noted  . Educated about COVID-19 virus infection 08/06/2019  . Precordial chest pain 06/20/2019  . Nonspecific abnormal electrocardiogram (ECG) (EKG) 06/20/2019  . Bilateral carpal tunnel syndrome 01/10/2018    Past Surgical History:  Procedure Laterality Date  . INGUINAL HERNIA REPAIR Left 10/07/2015   Procedure: LAPAROSCOPIC LEFT  INGUINAL HERNIA;  Surgeon: Axel Filler, MD;  Location: [facility];  Service: General;  Laterality: Left;  . INSERTION OF MESH Left 10/07/2015   Procedure: INSERTION OF MESH;  Surgeon: Axel Filler, MD;  Location: [facility];  Service: General;  Laterality: Left;       Family History  Problem Relation Age of Onset  . Sickle cell anemia Father     Social History   Tobacco Use  . Smoking status: Never Smoker  . Smokeless tobacco: Never Used   Vaping Use  . Vaping Use: Never used  Substance Use Topics  . Alcohol use: Yes    Comment: occasionally  . Drug use: No    Home Medications Prior to Admission medications   Medication Sig Start Date End Date Taking? Authorizing Provider  acetaminophen (TYLENOL) 325 MG tablet Take 650 mg by mouth every 6 (six) hours as needed for mild pain.    [provider]  hydrOXYzine (ATARAX/VISTARIL) 25 MG tablet Take 1 tablet (25 mg total) by mouth every 6 (six) hours. 10/29/19   Dahlia Byes A, NP  naproxen (NAPROSYN) 500 MG tablet Take 1 tablet (500 mg total) by mouth 2 (two) times daily. 03/06/19   Wallis Bamberg, PA-C  omeprazole (PRILOSEC) 20 MG capsule Take 1 capsule (20 mg total) by mouth 2 (two) times daily. Patient not taking: Reported on 07/21/2017 01/21/17 03/06/19  Rolland Porter, MD  sucralfate (CARAFATE) 1 g tablet Take 1 tablet (1 g total) by mouth 4 (four) times daily. Patient not taking: Reported on 07/21/2017 01/21/17 03/06/19  Rolland Porter, MD    Allergies    Patient has no known allergies.  Review of Systems   Review of Systems  Constitutional: Negative for appetite change, chills and fever.  HENT: Negative for ear pain, rhinorrhea, sneezing and sore throat.   Eyes: Negative for photophobia and visual disturbance.  Respiratory: Negative for cough, chest tightness, shortness of breath and wheezing.  Cardiovascular: Negative for chest pain and palpitations.  Gastrointestinal: Negative for abdominal pain, blood in stool, constipation, diarrhea, nausea and vomiting.  Genitourinary: Negative for dysuria, hematuria and urgency.  Musculoskeletal: Positive for myalgias and neck pain.  Skin: Negative for rash.  Neurological: Negative for dizziness, weakness and light-headedness.    Physical Exam Updated Vital Signs BP (!) 134/97   Pulse 63   Temp 98.1 F (36.7 C) (Oral)   Resp 16   Ht 5\' 9"  (1.753 m)   Wt 78 kg   SpO2 99%   BMI 25.40 kg/m   Physical Exam Vitals and  nursing note reviewed.  Constitutional:      General: He is not in acute distress.    Appearance: He is well-developed.  HENT:     Head: Normocephalic and atraumatic.     Nose: Nose normal.  Eyes:     General: No scleral icterus.       Left eye: No discharge.     Conjunctiva/sclera: Conjunctivae normal.  Neck:      Comments: Normal range of motion of neck.  No meningismus.  Equal grip strength bilaterally.  Normal sensation to light touch of bilateral upper extremities.  2+ radial pulse noted bilaterally. Cardiovascular:     Rate and Rhythm: Normal rate and regular rhythm.     Heart sounds: Normal heart sounds. No murmur heard.  No friction rub. No gallop.   Pulmonary:     Effort: Pulmonary effort is normal. No respiratory distress.     Breath sounds: Normal breath sounds.  Abdominal:     General: Bowel sounds are normal. There is no distension.     Palpations: Abdomen is soft.     Tenderness: There is no abdominal tenderness. There is no guarding.  Musculoskeletal:        General: Normal range of motion.     Cervical back: Normal range of motion and neck supple. Muscular tenderness present.  Skin:    General: Skin is warm and dry.     Findings: No rash.  Neurological:     Mental Status: He is alert.     Motor: No abnormal muscle tone.     Coordination: Coordination normal.     ED Results / Procedures / Treatments   Labs (all labs ordered are listed, but only abnormal results are displayed) Labs Reviewed - No data to display  EKG None  Radiology CT Cervical Spine Wo Contrast  Result Date: 04/26/2020 CLINICAL DATA:  Neck pain with right-sided radiculopathy EXAM: CT CERVICAL SPINE WITHOUT CONTRAST TECHNIQUE: Multidetector CT imaging of the cervical spine was performed without intravenous contrast. Multiplanar CT image reconstructions were also generated. COMPARISON:  None. FINDINGS: Alignment: Facet joints are aligned without dislocation or traumatic listhesis. Dens and  lateral masses are aligned. Straightening of the cervical lordosis. Skull base and vertebrae: No acute fracture. No primary bone lesion or focal pathologic process. Soft tissues and spinal canal: No prevertebral fluid or swelling. No visible canal hematoma. Disc levels: Intervertebral disc spaces are preserved. Unremarkable facet joints. Uncovertebral spurring results in mild right-sided foraminal stenosis at C4-5 (series 7, image 55). Borderline right-sided foraminal narrowing is also present at C5-6 related to uncovertebral spurring. No evidence of canal stenosis by CT. Upper chest: Visualized lung apices clear. Other: Minimal mucosal thickening within the inferior aspect of the left maxillary sinus. Mildly prominent bilateral cervical chain lymph nodes, nonspecific. IMPRESSION: 1. No acute fracture or traumatic listhesis of the cervical spine. 2. Uncovertebral spurring results in  mild right-sided foraminal stenosis at C4-5 and borderline right-sided foraminal narrowing at C5-6. No evidence of canal stenosis by CT. Electronically Signed   By: Duanne Guess D.O.   On: 04/26/2020 09:44    Procedures Procedures (including critical care time)  Medications Ordered in ED Medications  oxyCODONE-acetaminophen (PERCOCET/ROXICET) 5-325 MG per tablet 1 tablet (1 tablet Oral Given 04/26/20 2993)    ED Course  I have reviewed the triage vital signs and the nursing notes.  Pertinent labs & imaging results that were available during my care of the patient were reviewed by me and considered in my medical decision making (see chart for details).    MDM Rules/Calculators/A&P                          34 year old male presenting to the ED with a chief complaint of right-sided neck pain.  Symptoms have been going on for 2 days.  Reports paresthesias and shooting pain down his right arm.  Started taking prednisone since yesterday and muscle relaxer for the past 2 days.  States that he does not feel that either is  helping him.  Chart review shows that he had similar symptoms about 1 year ago and was diagnosed with cervical radiculopathy.  Reassuring imaging at that time.  On exam there is some tenderness of the right paraspinal musculature of the cervical spine without midline tenderness.  No meningeal signs.  Equal and intact distal pulses, upper extremities are neurovascularly intact.  Will attempt to control pain here, obtain CT of the cervical spine and reassess.  CT scan shows foraminal stenosis without any canal stenosis.  Suspect symptoms due to radiculopathy.  Patient educated that his medications will not work immediately after 1 dose, that he will need to complete the entire course for several days to evaluate for improvement.  He has no neurological deficits that concern me for infectious or vascular cause of symptoms.  No preceding trauma.  We will have him continue steroids, muscle relaxer and Tylenol for the next several days as well as heat, stretching and massaging.  We will have him follow-up with spine specialist and return for worsening symptoms.   Patient is hemodynamically stable, in NAD, and able to ambulate in the ED. Evaluation does not show pathology that would require ongoing emergent intervention or inpatient treatment. I explained the diagnosis to the patient. Pain has been managed and has no complaints prior to discharge. Patient is comfortable with above plan and is stable for discharge at this time. All questions were answered prior to disposition. Strict return precautions for returning to the ED were discussed. Encouraged follow up with PCP.   An After Visit Summary was printed and given to the patient.   Portions of this note were generated with Scientist, clinical (histocompatibility and immunogenetics). Dictation errors may occur despite best attempts at proofreading.  Final Clinical Impression(s) / ED Diagnoses Final diagnoses:  Cervical radiculopathy    Rx / DC Orders ED Discharge Orders    None        Dietrich Pates, PA-C 04/26/20 1016    Alvira Monday, MD 04/26/20 2245

## 2020-09-09 ENCOUNTER — Other Ambulatory Visit: Payer: Managed Care, Other (non HMO)

## 2020-09-09 ENCOUNTER — Other Ambulatory Visit: Payer: Self-pay | Admitting: Internal Medicine

## 2020-09-09 DIAGNOSIS — Z0000 Encounter for general adult medical examination without abnormal findings: Secondary | ICD-10-CM

## 2021-12-14 ENCOUNTER — Ambulatory Visit (HOSPITAL_COMMUNITY)
Admission: EM | Admit: 2021-12-14 | Discharge: 2021-12-14 | Disposition: A | Discharge status: Home / Self Care | Payer: Managed Care, Other (non HMO)

## 2021-12-14 DIAGNOSIS — L608 Other nail disorders: Secondary | ICD-10-CM

## 2021-12-14 NOTE — ED Provider Notes (Signed)
[place]    CSN: [id number] Arrival date & time: 12/14/21  [id number]      History   Chief Complaint Chief Complaint  Patient presents with   Nail Problem    HPI Steve Flores is a 36 y.o. male. He reports discolored nails on his fingers and toes since having his fingernails painted a couple of weeks ago. Has never had his toenails painted. Feels his cuticles on his fingernails are discolored also. No fingernail discoloration at this time because he just washed his hands. C/o yellow color in toenails and fingernails.   HPI  Past Medical History:  Diagnosis Date   Abrasion of finger of right hand 12/28/2011   healing wound right index finger   Fracture of phalanx of finger 12/26/2011   right ring prox. phalanx   Inguinal hernia     Patient Active Problem List   Diagnosis Date Noted   Educated about COVID-19 virus infection 08/06/2019   Precordial chest pain 06/20/2019   Nonspecific abnormal electrocardiogram (ECG) (EKG) 06/20/2019   Bilateral carpal tunnel syndrome 01/10/2018    Past Surgical History:  Procedure Laterality Date   INGUINAL HERNIA REPAIR Left 10/07/2015   Procedure: LAPAROSCOPIC LEFT  INGUINAL HERNIA;  Surgeon: Ralene Ok, MD;  Location: [facility];  Service: General;  Laterality: Left;   INSERTION OF MESH Left 10/07/2015   Procedure: INSERTION OF MESH;  Surgeon: Ralene Ok, MD;  Location: [facility];  Service: General;  Laterality: Left;       Home Medications    Prior to Admission medications   Medication Sig Start Date End Date Taking? Authorizing Provider  acetaminophen (TYLENOL) 325 MG tablet Take 650 mg by mouth every 6 (six) hours as needed for mild pain.    [provider]  hydrOXYzine (ATARAX/VISTARIL) 25 MG tablet Take 1 tablet (25 mg total) by mouth every 6 (six) hours. 10/29/19   Loura Halt A, NP  naproxen (NAPROSYN) 500 MG tablet Take 1 tablet (500 mg total) by mouth 2 (two) times daily. 03/06/19   Jaynee Eagles, PA-C   omeprazole (PRILOSEC) 20 MG capsule Take 1 capsule (20 mg total) by mouth 2 (two) times daily. Patient not taking: Reported on 07/21/2017 01/21/17 03/06/19  Tanna Furry, MD  sucralfate (CARAFATE) 1 g tablet Take 1 tablet (1 g total) by mouth 4 (four) times daily. Patient not taking: Reported on 07/21/2017 01/21/17 03/06/19  Tanna Furry, MD    Family History Family History  Problem Relation Age of Onset   Sickle cell anemia Father     Social History Social History   Tobacco Use   Smoking status: Never   Smokeless tobacco: Never  Vaping Use   Vaping Use: Never used  Substance Use Topics   Alcohol use: Yes    Comment: occasionally   Drug use: No     Allergies   Patient has no known allergies.   Review of Systems Review of Systems   Physical Exam Triage Vital Signs ED Triage Vitals  Enc Vitals Group     BP 12/14/21 0903 119/74     Pulse Rate 12/14/21 0903 80     Resp 12/14/21 0903 18     Temp 12/14/21 0903 (!) 97.5 F (36.4 C)     Temp Source 12/14/21 0903 Oral     SpO2 12/14/21 0903 100 %     Weight --      Height --      Head Circumference --  Peak Flow --      Pain Score 12/14/21 1008 0     Pain Loc --      Pain Edu? --      Excl. in [place]? --    No data found.  Updated Vital Signs BP 119/74 (BP Location: Left Arm)   Pulse 80   Temp (!) 97.5 F (36.4 C) (Oral)   Resp 18   SpO2 100%   Visual Acuity Right Eye Distance:   Left Eye Distance:   Bilateral Distance:    Right Eye Near:   Left Eye Near:    Bilateral Near:     Physical Exam Pulmonary:     Effort: Pulmonary effort is normal.  Skin:    Comments: Fingernails of both hands have some discoloration of distal half of fingernails that is consistent with the discoloration that can happen from nail polish.  Toenails of both feet are slightly orange in color.   Neurological:     Mental Status: He is alert and oriented to person, place, and time.     UC Treatments / Results  Labs (all labs  ordered are listed, but only abnormal results are displayed) Labs Reviewed - No data to display  EKG   Radiology No results found.  Procedures Procedures (including critical care time)  Medications Ordered in UC Medications - No data to display  Initial Impression / Assessment and Plan / UC Course  I have reviewed the triage vital signs and the nursing notes.  Pertinent labs & imaging results that were available during my care of the patient were reviewed by me and considered in my medical decision making (see chart for details).    Appearance of nails not consistent with fungal infection today. I'm not certain of cause of discoloration of toenails. Recommended pt try podiatry for care. No concerning findings of fingernails today.   Final Clinical Impressions(s) / UC Diagnoses   Final diagnoses:  Nail discoloration     Discharge Instructions      Please follow up with the podiatry office for care for your nails.    ED Prescriptions   None    PDMP not reviewed this encounter.   Carvel Getting, NP 12/14/21 1018

## 2021-12-14 NOTE — Discharge Instructions (Signed)
Please follow up with the podiatry office for care for your nails.

## 2021-12-14 NOTE — ED Triage Notes (Signed)
C/o nail infection/ discoloration after getting a manicure 1-2 weeks ago.

## 2022-01-25 ENCOUNTER — Other Ambulatory Visit: Payer: Self-pay

## 2022-01-25 ENCOUNTER — Emergency Department (HOSPITAL_BASED_OUTPATIENT_CLINIC_OR_DEPARTMENT_OTHER): Payer: Medicaid Other | Admitting: Radiology

## 2022-01-25 ENCOUNTER — Emergency Department (HOSPITAL_BASED_OUTPATIENT_CLINIC_OR_DEPARTMENT_OTHER): Payer: Medicaid Other

## 2022-01-25 ENCOUNTER — Emergency Department (HOSPITAL_BASED_OUTPATIENT_CLINIC_OR_DEPARTMENT_OTHER)
Admission: EM | Admit: 2022-01-25 | Discharge: 2022-01-25 | Disposition: A | Discharge status: Home / Self Care | Payer: Medicaid Other | Attending: Emergency Medicine | Admitting: Emergency Medicine

## 2022-01-25 ENCOUNTER — Encounter (HOSPITAL_BASED_OUTPATIENT_CLINIC_OR_DEPARTMENT_OTHER): Payer: Self-pay | Admitting: Emergency Medicine

## 2022-01-25 DIAGNOSIS — R519 Headache, unspecified: Secondary | ICD-10-CM | POA: Diagnosis not present

## 2022-01-25 DIAGNOSIS — K2100 Gastro-esophageal reflux disease with esophagitis, without bleeding: Secondary | ICD-10-CM | POA: Diagnosis not present

## 2022-01-25 DIAGNOSIS — Z20822 Contact with and (suspected) exposure to covid-19: Secondary | ICD-10-CM | POA: Diagnosis not present

## 2022-01-25 DIAGNOSIS — M545 Low back pain, unspecified: Secondary | ICD-10-CM | POA: Diagnosis not present

## 2022-01-25 DIAGNOSIS — R1013 Epigastric pain: Secondary | ICD-10-CM | POA: Diagnosis present

## 2022-01-25 LAB — COMPREHENSIVE METABOLIC PANEL
ALT: 14 U/L (ref 0–44)
AST: 25 U/L (ref 15–41)
Albumin: 4.2 g/dL (ref 3.5–5.0)
Alkaline Phosphatase: 56 U/L (ref 38–126)
Anion gap: 11 (ref 5–15)
BUN: 13 mg/dL (ref 6–20)
CO2: 25 mmol/L (ref 22–32)
Calcium: 9.6 mg/dL (ref 8.9–10.3)
Chloride: 99 mmol/L (ref 98–111)
Creatinine, Ser: 1.08 mg/dL (ref 0.61–1.24)
GFR, Estimated: >60 mL/min (ref >60)
Glucose, Bld: 108 mg/dL — ABNORMAL HIGH (ref 70–99)
Potassium: 4.4 mmol/L (ref 3.5–5.1)
Sodium: 135 mmol/L (ref 135–145)
Total Bilirubin: 0.5 mg/dL (ref 0.3–1.2)
Total Protein: 9.1 g/dL — ABNORMAL HIGH (ref 6.5–8.1)

## 2022-01-25 LAB — URINALYSIS, ROUTINE W REFLEX MICROSCOPIC
Bilirubin Urine: NEGATIVE
Glucose, UA: NEGATIVE mg/dL
Hgb urine dipstick: NEGATIVE
Ketones, ur: 15 mg/dL — AB
Leukocytes,Ua: NEGATIVE
Nitrite: NEGATIVE
Specific Gravity, Urine: 1.038 — ABNORMAL HIGH (ref 1.005–1.030)
pH: 7 (ref 5.0–8.0)

## 2022-01-25 LAB — CBC
HCT: 41.6 % (ref 39.0–52.0)
Hemoglobin: 14.7 g/dL (ref 13.0–17.0)
MCH: 27.8 pg (ref 26.0–34.0)
MCHC: 35.3 g/dL (ref 30.0–36.0)
MCV: 78.8 fL — ABNORMAL LOW (ref 80.0–100.0)
Platelets: 198 10*3/uL (ref 150–400)
RBC: 5.28 MIL/uL (ref 4.22–5.81)
RDW: 14.1 % (ref 11.5–15.5)
WBC: 14.8 10*3/uL — ABNORMAL HIGH (ref 4.0–10.5)
nRBC: 0 % (ref 0.0–0.2)

## 2022-01-25 LAB — RESP PANEL BY RT-PCR (FLU A&B, COVID) ARPGX2
Influenza A by PCR: NEGATIVE
Influenza B by PCR: NEGATIVE
SARS Coronavirus 2 by RT PCR: NEGATIVE

## 2022-01-25 LAB — LIPASE, BLOOD: Lipase: 27 U/L (ref 11–51)

## 2022-01-25 MED ORDER — KETOROLAC TROMETHAMINE 15 MG/ML IJ SOLN
15.0000 mg | Freq: Once | INTRAMUSCULAR | Status: AC
  Administered 2022-01-25: 15 mg via INTRAVENOUS
  Filled 2022-01-25: qty 1

## 2022-01-25 MED ORDER — LACTATED RINGERS IV BOLUS
1000.0000 mL | Freq: Once | INTRAVENOUS | Status: AC
  Administered 2022-01-25: 1000 mL via INTRAVENOUS

## 2022-01-25 MED ORDER — ONDANSETRON HCL 4 MG/2ML IJ SOLN
4.0000 mg | Freq: Once | INTRAMUSCULAR | Status: AC
  Administered 2022-01-25: 4 mg via INTRAVENOUS
  Filled 2022-01-25: qty 2

## 2022-01-25 MED ORDER — IOHEXOL 300 MG/ML  SOLN
100.0000 mL | Freq: Once | INTRAMUSCULAR | Status: AC | PRN
Start: 2022-01-25 — End: 2022-01-25
  Administered 2022-01-25: 80 mL via INTRAVENOUS

## 2022-01-25 MED ORDER — ALUM & MAG HYDROXIDE-SIMETH 200-200-20 MG/5ML PO SUSP
30.0000 mL | Freq: Once | ORAL | Status: AC
  Administered 2022-01-25: 30 mL via ORAL
  Filled 2022-01-25: qty 30

## 2022-01-25 MED ORDER — PANTOPRAZOLE SODIUM 20 MG PO TBEC: 20.0000 mg | DELAYED_RELEASE_TABLET | Freq: Every day | ORAL | 0 refills | Status: AC

## 2022-01-25 MED ORDER — PANTOPRAZOLE SODIUM 40 MG IV SOLR
40.0000 mg | Freq: Once | INTRAVENOUS | Status: AC
  Administered 2022-01-25: 40 mg via INTRAVENOUS
  Filled 2022-01-25: qty 10

## 2022-01-25 MED ORDER — FENTANYL CITRATE PF 50 MCG/ML IJ SOSY
50.0000 ug | PREFILLED_SYRINGE | Freq: Once | INTRAMUSCULAR | Status: AC
  Administered 2022-01-25: 50 ug via INTRAVENOUS
  Filled 2022-01-25: qty 1

## 2022-01-25 NOTE — ED Triage Notes (Signed)
Pt arrives to ED with c/o epigastric abdominal pain with radiation to his chest. This started last night after laying down. Pt reports a burning sensation that is constant. Pt also reports lower back aches and constant throbbing headache.

## 2022-01-25 NOTE — ED Provider Notes (Signed)
[facility] EMERGENCY DEPT Provider Note   CSN: [id number] Arrival date & time: 01/25/22  7253     History  Chief Complaint  Patient presents with   Abdominal Pain    Steve Flores is a 36 y.o. male.  HPI 36 year old male presents today complaining of epigastric abdominal pain radiating up to his chest.  He states this began last night after laying down.  Constant ongoing constant burning sensation.  He also has low back pain and a throbbing headache.  He has not had any trauma.  No nausea, vomiting, diarrhea.  He has had some similar symptoms in the past with reflux.  Patient also reports pain going up into his throat now. Reports that he has had had a significant episode of reflux esophagitis and required endoscopy in the past.     Home Medications Prior to Admission medications   Medication Sig Start Date End Date Taking? Authorizing Provider  pantoprazole (PROTONIX) 20 MG tablet Take 1 tablet (20 mg total) by mouth daily. 01/25/22  Yes Margarita Grizzle, MD  acetaminophen (TYLENOL) 325 MG tablet Take 650 mg by mouth every 6 (six) hours as needed for mild pain.    [provider]  hydrOXYzine (ATARAX/VISTARIL) 25 MG tablet Take 1 tablet (25 mg total) by mouth every 6 (six) hours. 10/29/19   Dahlia Byes A, NP  naproxen (NAPROSYN) 500 MG tablet Take 1 tablet (500 mg total) by mouth 2 (two) times daily. 03/06/19   Wallis Bamberg, PA-C  omeprazole (PRILOSEC) 20 MG capsule Take 1 capsule (20 mg total) by mouth 2 (two) times daily. Patient not taking: Reported on 07/21/2017 01/21/17 03/06/19  Rolland Porter, MD  sucralfate (CARAFATE) 1 g tablet Take 1 tablet (1 g total) by mouth 4 (four) times daily. Patient not taking: Reported on 07/21/2017 01/21/17 03/06/19  Rolland Porter, MD      Allergies    Patient has no known allergies.    Review of Systems   Review of Systems  Physical Exam Updated Vital Signs BP 120/83   Pulse 80   Temp 99.5 F (37.5 C)   Resp 18   Ht 1.778 m  (5\' 10" )   Wt 80.7 kg   SpO2 97%   BMI 25.54 kg/m  Physical Exam Vitals and nursing note reviewed.  Constitutional:      Appearance: He is well-developed.  HENT:     Head: Normocephalic and atraumatic.     Right Ear: External ear normal.     Left Ear: External ear normal.     Nose: Nose normal.  Eyes:     Extraocular Movements: Extraocular movements intact.  Neck:     Trachea: No tracheal deviation.  Cardiovascular:     Rate and Rhythm: Normal rate and regular rhythm.  Pulmonary:     Effort: Pulmonary effort is normal.  Abdominal:     General: Abdomen is flat. Bowel sounds are normal.     Palpations: Abdomen is soft.     Tenderness: There is abdominal tenderness in the epigastric area.  Musculoskeletal:        General: Normal range of motion.  Skin:    General: Skin is warm and dry.     Capillary Refill: Capillary refill takes less than 2 seconds.  Neurological:     General: No focal deficit present.     Mental Status: He is alert and oriented to person, place, and time.  Psychiatric:        Mood and Affect: Mood  normal.        Behavior: Behavior normal.     ED Results / Procedures / Treatments   Labs (all labs ordered are listed, but only abnormal results are displayed) Labs Reviewed  COMPREHENSIVE METABOLIC PANEL - Abnormal; Notable for the following components:      Result Value   Glucose, Bld 108 (*)    Total Protein 9.1 (*)    All other components within normal limits  CBC - Abnormal; Notable for the following components:   WBC 14.8 (*)    MCV 78.8 (*)    All other components within normal limits  URINALYSIS, ROUTINE W REFLEX MICROSCOPIC - Abnormal; Notable for the following components:   Specific Gravity, Urine 1.038 (*)    Ketones, ur 15 (*)    Protein, ur TRACE (*)    All other components within normal limits  RESP PANEL BY RT-PCR (FLU A&B, COVID) [id number]  LIPASE, BLOOD    EKG EKG Interpretation  Date/Time:  Thursday January 25 2022 08:40:16  EDT Ventricular Rate:  76 PR Interval:  132 QRS Duration: 72 QT Interval:  356 QTC Calculation: 400 R Axis:   -52 Text Interpretation: Normal sinus rhythm Pulmonary disease pattern Left anterior fascicular block Abnormal ECG When compared with ECG of 07-Jun-2019 01:28, Premature supraventricular complexes are no longer Present Non-specific change in ST segment in Anterior leads T wave inversion less evident in Inferior leads T wave inversion no longer evident in Anterolateral leads Confirmed by Margarita Grizzle [phone]) on 01/25/2022 9:28:08 AM  Radiology CT ABDOMEN PELVIS W CONTRAST  Result Date: 01/25/2022 CLINICAL DATA:  Epigastric abdominal pain radiating to his chest since last night after laying down. Constant burning sensation. EXAM: CT ABDOMEN AND PELVIS WITH CONTRAST TECHNIQUE: Multidetector CT imaging of the abdomen and pelvis was performed using the standard protocol following bolus administration of intravenous contrast. RADIATION DOSE REDUCTION: This exam was performed according to the departmental dose-optimization program which includes automated exposure control, adjustment of the mA and/or kV according to patient size and/or use of iterative reconstruction technique. CONTRAST:  29mL OMNIPAQUE IOHEXOL 300 MG/ML  SOLN COMPARISON:  None Available. FINDINGS: Lower chest: No acute abnormality. Hepatobiliary: No focal liver abnormality is seen. No gallstones, gallbladder wall thickening, or biliary dilatation. Pancreas: Unremarkable. No pancreatic ductal dilatation or surrounding inflammatory changes. Spleen: Normal in size without focal abnormality. Adrenals/Urinary Tract: Adrenal glands are unremarkable. Kidneys are normal, without renal calculi, focal lesion, or hydronephrosis. Bladder is unremarkable. Stomach/Bowel: Stomach is within normal limits. Appendix appears normal. No evidence of bowel wall thickening, distention, or inflammatory changes. Vascular/Lymphatic: No significant vascular  findings are present. No enlarged abdominal or pelvic lymph nodes. Reproductive: Prostate is unremarkable. Other: No abdominal wall hernia or abnormality. Prior left inguinal hernia repair. No abdominopelvic ascites. Musculoskeletal: No acute or significant osseous findings. IMPRESSION: 1. No acute intra-abdominal process. Electronically Signed   By: Obie Dredge M.D.   On: 01/25/2022 11:08   DG Chest 2 View  Result Date: 01/25/2022 CLINICAL DATA:  Epigastric abdominal pain radiating to the chest. Constant burning sensation. EXAM: CHEST - 2 VIEW COMPARISON:  Chest x-Nicholi Ghuman dated June 07, 2019. FINDINGS: The heart size and mediastinal contours are within normal limits. Both lungs are clear. The visualized skeletal structures are unremarkable. IMPRESSION: No active cardiopulmonary disease. Electronically Signed   By: Obie Dredge M.D.   On: 01/25/2022 09:22    Procedures Procedures    Medications Ordered in ED Medications  fentaNYL (SUBLIMAZE) injection 50 mcg (50  mcg Intravenous Given 01/25/22 1044)  ondansetron (ZOFRAN) injection 4 mg (4 mg Intravenous Given 01/25/22 1044)  lactated ringers bolus 1,000 mL (1,000 mLs Intravenous New Bag/Given 01/25/22 1044)  iohexol (OMNIPAQUE) 300 MG/ML solution 100 mL (80 mLs Intravenous Contrast Given 01/25/22 1052)  pantoprazole (PROTONIX) injection 40 mg (40 mg Intravenous Given 01/25/22 1146)  alum & mag hydroxide-simeth (MAALOX/MYLANTA) 200-200-20 MG/5ML suspension 30 mL (30 mLs Oral Given 01/25/22 1145)  ketorolac (TORADOL) 15 MG/ML injection 15 mg (15 mg Intravenous Given 01/25/22 1212)    ED Course/ Medical Decision Making/ A&P                           Medical Decision Making Epigastric and right Upper Quadrant  Differential diagnosis includes but is not limited to the blood  Biliary colic no evidence of acute biliary area abnormalities noted on CT scan Cholangitis Cholecystitis  Hepatitis Hepatic abscess no evidence of same on CT Hepatic  congestion-liver enzymes are normal Herpes zoster-no rashes are noted Mesenteric ischemia given patient's age listed below and are listed of differential.  Patient appears hemodynamically stable and not consistent with ischemia Perforated duodenal ulcer-CT without any evidence of perforation no free air noted Pneumonia (RLL) no evidence of lower lobe pneumonia noted on CT Pulmonary embolism Pyelonephritis/nephrolithiasis-no evidence of urinary tract infection or hematuria Doubt cholecystitis patient with normal liver enzymes, normal bilirubin, and no evidence of acute abnormalities noted on CT scan No evidence of appendicitis No evidence of small bowel obstruction 36 year old male with diffuse abdominal pain that began as epigastric burning in nature. No evidence of acute intra-abdominal or lower chest abnormalities as noted above Given patient's history of reflux I suspect that there is some component of gastritis and esophagitis.  Patient is given GI cocktail here in the ED. Plan to start Protonix and patient advised regarding close follow-up with his primary care and will give referral to GI Patient is not on any immunosuppressants but has a history of rheumatoid arthritis.  He has been taking ibuprofen for this. We have discussed stopping ibuprofen, dietary management, will start Protonix. Discussed return precautions voices understanding  Amount and/or Complexity of Data Reviewed Labs: ordered. Decision-making details documented in ED Course. Radiology: ordered and independent interpretation performed. Decision-making details documented in ED Course.  Risk OTC drugs. Prescription drug management.           Final Clinical Impression(s) / ED Diagnoses Final diagnoses:  Gastroesophageal reflux disease with esophagitis without hemorrhage    Rx / DC Orders ED Discharge Orders          Ordered    pantoprazole (PROTONIX) 20 MG tablet  Daily        01/25/22 1259               Margarita Grizzle, MD 01/25/22 1300

## 2022-01-25 NOTE — Discharge Instructions (Addendum)
Please stop taking ibuprofen Elevate head of your bed Eat diet as discussed Follow-up with your primary care doctor for possible GI referral Return if you are having worsening symptoms at any time

## 2022-03-26 NOTE — Progress Notes (Signed)
Office Visit Note  Patient: Steve Flores             Date of Birth: [birth year]           MRN: [id number]             PCP: Scheryl Marten, PA Referring: Scheryl Marten, [place] Visit Date: 03/28/2022 Occupation: Currently disabled  Subjective:  New Patient (Initial Visit) (Rheumatoid arthritis, long term management. )   History of Present Illness: Steve Flores is a 36 y.o. male here for evaluation and management of inflammatory arthritis, previously seen at [facility] rheumatology with inflammatory changes in wrists, MCPs, and PIPs. He has not been consistently established with medical insurance limiting original diagnosis and treatment.  He developed the joint inflammation symptoms starting since around 2017.  He saw [facility] rheumatology work-up at that time with positive RNP and SSA antibodies with proximal hand swelling and was recommended to start treatment.  He never actually got onto the medicine which appears to have been subcutaneous methotrexate due to insurance issues.  Since that time he has had a lot of persistent joint pains most severe in his hands and wrist but also with pain affecting his hips and feet.  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  He is also become disabled from work due to physical limitations especially with his hands.  In addition to the pain swelling and stiffness he also describes tingling and numbness extending into the hands.  He cannot perform push-ups or other activities requiring wrist extension.  He is also unable to tightly grip items with either hand.  Labs reviewed 09/2020 ANA 1:1280 speckled RNP >8.0 SSA 6.6 dsDNA, SM, SSB neg RF neg CCP neg HLA-B27 neg HAV/HBV/HCV neg  Activities of Daily Living:  Patient reports morning stiffness for 1-3 hours.   Patient Reports nocturnal pain.   Difficulty dressing/grooming: Denies Difficulty climbing stairs: Denies Difficulty getting out of chair: Denies Difficulty using hands for taps, buttons, cutlery, and/or writing: Reports  Review of Systems  Constitutional:  Positive for fatigue.  HENT:  Negative for mouth sores and mouth dryness.   Eyes:  Negative for dryness.  Respiratory:  Negative for shortness of breath.   Cardiovascular:  Negative for chest pain and palpitations.  Gastrointestinal:  Negative for blood in stool, constipation and diarrhea.  Endocrine: Negative for increased urination.  Genitourinary:  Negative for involuntary urination.  Musculoskeletal:  Positive for joint pain, joint pain, joint swelling, myalgias, muscle weakness, morning stiffness, muscle tenderness and myalgias. Negative for gait problem.  Skin:  Negative for color change, rash, hair loss and sensitivity to sunlight.  Allergic/Immunologic: Negative for susceptible to infections.  Neurological:  Positive for dizziness and headaches.  Hematological:  Negative for swollen glands.  Psychiatric/Behavioral:  Positive for depressed mood and sleep disturbance. The patient is nervous/anxious.     PMFS History:  Patient Active Problem List   Diagnosis Date Noted   Seronegative rheumatoid arthritis ([place]) 03/28/2022   High risk medication use 03/28/2022   Educated about COVID-19 virus infection 08/06/2019   Precordial chest pain 06/20/2019   Nonspecific abnormal electrocardiogram (ECG) (EKG) 06/20/2019   Bilateral carpal tunnel syndrome 01/10/2018   Laryngopharyngeal reflux (LPR) 11/04/2017   Persistent hoarseness 11/04/2017    Past Medical History:  Diagnosis Date   Abrasion of finger of right hand 12/28/2011   healing  wound right index finger   Fracture of phalanx of finger 12/26/2011   right ring prox. phalanx   GERD (gastroesophageal reflux disease)    Inguinal hernia    Rheumatoid aortitis     Family History  Problem Relation Age of  Onset   Rheum arthritis Mother    Sickle cell anemia Father    Asthma Sister    Healthy Son    Past Surgical History:  Procedure Laterality Date   INGUINAL HERNIA REPAIR Left 10/07/2015   Procedure: LAPAROSCOPIC LEFT  INGUINAL HERNIA;  Surgeon: Ralene Ok, MD;  Location: [facility];  Service: General;  Laterality: Left;   INSERTION OF MESH Left 10/07/2015   Procedure: INSERTION OF MESH;  Surgeon: Ralene Ok, MD;  Location: [facility];  Service: General;  Laterality: Left;   Social History   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     Immunization History  Administered Date(s) Administered   Tdap 10/10/2019     Objective: Vital Signs: BP 119/77 (BP Location: Left Arm, Patient Position: Sitting, Cuff Size: Normal)   Pulse 69   Resp 15   Ht 5' 9" (1.753 m)   Wt 188 lb 9.6 oz (85.5 kg)   BMI 27.85 kg/m    Physical Exam HENT:     Mouth/Throat:     Mouth: Mucous membranes are moist.     Pharynx: Oropharynx is clear.  Cardiovascular:     Rate and Rhythm: Normal rate and regular rhythm.  Pulmonary:     Effort: Pulmonary effort is normal.     Breath sounds: Normal breath sounds.  Lymphadenopathy:     Cervical: No cervical adenopathy.  Skin:    General: Skin is warm and dry.     Findings: No rash.  Neurological:     Mental Status: He is alert.  Psychiatric:        Mood and Affect: Mood normal.      Musculoskeletal Exam:  Shoulders full ROM sore with overhead abduction, no focal tenderness Elbows full ROM no tenderness or swelling Right wrist mild swelling and tenderness to pressure, right 2nd-3rd MCPs and 2nd PIP swelling and tenderness, tenderness of multiple MCPs and PIPs on left hand with probable chornic swelling no palpable effusion Knees full ROM no tenderness or swelling Ankles full ROM no tenderness or swelling Right 1st MTP soft tissue swelling and decreased dorsiflexion ROM   CDAI Exam: CDAI Score: 22  Patient  Global: 50 mm; Provider Global: 40 mm Swollen: 4 ; Tender: 9  Joint Exam 03/28/2022      Right  Left  Wrist  Swollen Tender     MCP 2  Swollen Tender   Tender  MCP 3  Swollen Tender   Tender  MCP 4      Tender  PIP 2  Swollen Tender   Tender  PIP 3      Tender     Investigation: No additional findings.  Imaging: XR Hand 2 View Right  Result Date: 03/28/2022 X-ray right hand 2 views Radiocarpal joint space appears normal.  There are several cystic changes present in the carpal bones.  Multiple cystic lesions present and first MCP.  Other MCP joint spaces appear preserved but with lateral osteophytes at base of proximal phalanges.  Surgical screws in position and the fourth proximal phalanx.  Otherwise PIP and DIP joints appear normal.  No erosions or abnormal calcifications seen.  Bone mineralization appears normal. Impression No erosive disease,  surgical hardware in place, mild degenerative changes in proximal joints  XR Hand 2 View Left  Result Date: 03/28/2022 X-ray left hand 2 views Radiocarpal joint space appears normal.  There are several cystic changes and carpal bones no significant degenerative first CMC joint disease.  MCP joint spaces appear well-preserved early osteophyte laterally at the base of the second proximal phalanx.  PIP and DIP joint spaces appear normal.  No erosions or abnormal calcifications seen. Impression Mild appearing osteoarthritis in carpal bones and second MCP no erosive disease changes  XR HIPS BILAT W OR W/O PELVIS 3-4 VIEWS  Result Date: 03/28/2022 X-ray hips bilateral 3 views Visualized portion of lumbar spine appears grossly normal.  SI joints are patent bilaterally there is mild sclerosis no visible erosions or joint widening.  Hip joint spaces appear well-preserved bilaterally.  There is a small spot at left hip could be consistent with a calcification no alternate view available for comparison. Impression No significant appearing hip joint  osteoarthritis  XR Foot 2 Views Right  Result Date: 03/28/2022 X-ray right foot 2 views Tibiotalar joint space not well visualized but no osteophytes seen.  Mild midfoot joint degenerative changes.  There is significant osteoarthritis of the first MTP joint with joint narrowing and lateral osteophyte process.  Other MTP joint spaces appear normal.  No definite erosions or abnormal calcifications seen. Impression Moderately severe first MTP joint osteoarthritis no erosive disease changes   Recent Labs: Lab Results  Component Value Date   WBC 4.4 03/28/2022   HGB 14.9 03/28/2022   PLT 200 03/28/2022   NA 137 03/28/2022   K 4.0 03/28/2022   CL 105 03/28/2022   CO2 25 03/28/2022   GLUCOSE 80 03/28/2022   BUN 18 03/28/2022   CREATININE 1.05 03/28/2022   BILITOT 0.3 03/28/2022   ALKPHOS 56 01/25/2022   AST 27 03/28/2022   ALT 16 03/28/2022   PROT 8.0 03/28/2022   ALBUMIN 4.2 01/25/2022   CALCIUM 9.2 03/28/2022   GFRAA >60 10/29/2019    Speciality Comments: No specialty comments available.  Procedures:  No procedures performed Allergies: Patient has no known allergies.   Assessment / Plan:     Visit Diagnoses: Seronegative rheumatoid arthritis ([place]) - Plan: XR Hand 2 View Right, XR Hand 2 View Left, XR HIPS BILAT W OR W/O PELVIS 3-4 VIEWS, XR Foot 2 Views Right, Sedimentation rate, C-reactive protein, methotrexate 50 MG/2ML injection, TUBERCULIN SYR 1CC/26GX3/8" 26G X 3/8" 1 ML MISC, folic acid (FOLVITE) 1 MG tablet  Joint pain and stiffness at multiple sites only appreciable synovitis on exam currently is in the hand.  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  Has significant reflux with complications but otherwise no Raynaud's or significant telangiectasias or skin changes.  X-rays do not demonstrate any erosive disease changes.  Plan to start methotrexate 15 mg subcu weekly with folic acid 1 mg  daily.  Bilateral carpal tunnel syndrome  The described numbness and intolerance of wrist positioning sounds very suggestive for carpal tunnel syndrome.  This may be coming primarily from his proximal hand and wrist inflammation some may benefit with treatment of these underlying problem.  If not improving would further investigate to treat.  High risk medication use - Plan: CBC with Differential/Platelet, COMPLETE METABOLIC PANEL WITH GFR  Checking CBC and CMP anticipating initial DMARD start with methotrexate.  Discussed risks of medication including GI symptoms, cytopenia, and hepatotoxicity.  Previous baseline labs including hepatitis screening reviewed from outside rheumatology records.  Orders: Orders Placed This Encounter  Procedures   XR Hand 2 View Right   XR Hand 2 View Left   XR HIPS BILAT W OR W/O PELVIS 3-4 VIEWS   XR Foot 2 Views Right   Sedimentation rate   C-reactive protein   CBC with Differential/Platelet   COMPLETE METABOLIC PANEL WITH GFR   Meds ordered this encounter  Medications   methotrexate 50 MG/2ML injection    Sig: Inject 0.6 mLs (15 mg total) into the skin once a week.    Dispense:  4 mL    Refill:  0   TUBERCULIN SYR 1CC/26GX3/8" 26G X 3/8" 1 ML MISC    Sig: For injection of subcutaneous methotrexate once weekly    Dispense:  25 each    Refill:  0    Pharmacy may substitute for equivalent volume syringes and equipment eg needles as needed   folic acid (FOLVITE) 1 MG tablet    Sig: Take 1 tablet (1 mg total) by mouth daily.    Refill:  0     Follow-Up Instructions: Return in about 4 weeks (around 04/25/2022) for New pt RA MTX start f/u 86mo   CCollier Salina MD  Note - This record has been created using [employer]  Chart creation errors have been sought, but may not always  have been located. Such creation errors do not reflect on  the standard of medical care.

## 2022-03-28 ENCOUNTER — Ambulatory Visit: Payer: Medicaid Other | Attending: Internal Medicine | Admitting: Internal Medicine

## 2022-03-28 ENCOUNTER — Encounter: Payer: Self-pay | Admitting: Internal Medicine

## 2022-03-28 ENCOUNTER — Ambulatory Visit (INDEPENDENT_AMBULATORY_CARE_PROVIDER_SITE_OTHER): Payer: Medicaid Other

## 2022-03-28 VITALS — BP 119/77 | HR 69 | Resp 15 | Ht 69.0 in | Wt 188.6 lb

## 2022-03-28 DIAGNOSIS — M0600 Rheumatoid arthritis without rheumatoid factor, unspecified site: Secondary | ICD-10-CM

## 2022-03-28 DIAGNOSIS — M79671 Pain in right foot: Secondary | ICD-10-CM | POA: Diagnosis not present

## 2022-03-28 DIAGNOSIS — G5603 Carpal tunnel syndrome, bilateral upper limbs: Secondary | ICD-10-CM | POA: Diagnosis not present

## 2022-03-28 DIAGNOSIS — M79642 Pain in left hand: Secondary | ICD-10-CM

## 2022-03-28 DIAGNOSIS — M79641 Pain in right hand: Secondary | ICD-10-CM | POA: Diagnosis not present

## 2022-03-28 DIAGNOSIS — Z79899 Other long term (current) drug therapy: Secondary | ICD-10-CM

## 2022-03-28 MED ORDER — FOLIC ACID 1 MG PO TABS: 1.0000 mg | ORAL_TABLET | Freq: Every day | ORAL | 0 refills | Status: DC

## 2022-03-28 MED ORDER — METHOTREXATE SODIUM CHEMO INJECTION 50 MG/2ML: 15.0000 mg | INTRAMUSCULAR | 0 refills | Status: DC

## 2022-03-28 MED ORDER — "TUBERCULIN SYRINGE 26G X 3/8"" 1 ML MISC": 0 refills | Status: DC

## 2022-03-29 LAB — COMPLETE METABOLIC PANEL WITH GFR
AG Ratio: 0.9 (calc) — ABNORMAL LOW (ref 1.0–2.5)
ALT: 16 U/L (ref 9–46)
AST: 27 U/L (ref 10–40)
Albumin: 3.7 g/dL (ref 3.6–5.1)
Alkaline phosphatase (APISO): 53 U/L (ref 36–130)
BUN: 18 mg/dL (ref 7–25)
CO2: 25 mmol/L (ref 20–32)
Calcium: 9.2 mg/dL (ref 8.6–10.3)
Chloride: 105 mmol/L (ref 98–110)
Creat: 1.05 mg/dL (ref 0.60–1.26)
Globulin: 4.3 g/dL (calc) — ABNORMAL HIGH (ref 1.9–3.7)
Glucose, Bld: 80 mg/dL (ref 65–99)
Potassium: 4 mmol/L (ref 3.5–5.3)
Sodium: 137 mmol/L (ref 135–146)
Total Bilirubin: 0.3 mg/dL (ref 0.2–1.2)
Total Protein: 8 g/dL (ref 6.1–8.1)
eGFR: 94 mL/min/{1.73_m2} (ref >=60)

## 2022-03-29 LAB — CBC WITH DIFFERENTIAL/PLATELET
Absolute Monocytes: 524 cells/uL (ref 200–950)
Basophils Absolute: 31 cells/uL (ref 0–200)
Basophils Relative: 0.7 %
Eosinophils Absolute: 554 cells/uL — ABNORMAL HIGH (ref 15–500)
Eosinophils Relative: 12.6 %
HCT: 43.6 % (ref 38.5–50.0)
Hemoglobin: 14.9 g/dL (ref 13.2–17.1)
Lymphs Abs: 1210 cells/uL (ref 850–3900)
MCH: 28 pg (ref 27.0–33.0)
MCHC: 34.2 g/dL (ref 32.0–36.0)
MCV: 82 fL (ref 80.0–100.0)
MPV: 11.1 fL (ref 7.5–12.5)
Monocytes Relative: 11.9 %
Neutro Abs: 2081 cells/uL (ref 1500–7800)
Neutrophils Relative %: 47.3 %
Platelets: 200 10*3/uL (ref 140–400)
RBC: 5.32 10*6/uL (ref 4.20–5.80)
RDW: 14.8 % (ref 11.0–15.0)
Total Lymphocyte: 27.5 %
WBC: 4.4 10*3/uL (ref 3.8–10.8)

## 2022-03-29 LAB — SEDIMENTATION RATE: Sed Rate: 22 mm/h — ABNORMAL HIGH (ref 0–15)

## 2022-03-29 LAB — C-REACTIVE PROTEIN: CRP: 4.7 mg/L (ref <8.0)

## 2022-03-29 NOTE — Progress Notes (Signed)
Lab results look fine for starting methotrexate as planned his liver and kidney function are normal.  His sedimentation rate is mildly elevated at 22 that this indicates active inflammation and that seems consistent with the joint pain and swelling.Steve Flores

## 2022-04-13 NOTE — Progress Notes (Signed)
Office Visit Note  Patient: Steve Flores             Date of Birth: 1985-11-21           MRN: [id number]             PCP: Scheryl Marten, PA Referring: Scheryl Marten, [place] Visit Date: 04/27/2022   Subjective:  Follow-up (Wants to discuss side-effects of MTX and interactions. )   History of Present Illness: Steve Flores is a 36 y.o. male here for follow up for seronegative RA on MTX 15 mg [place] weekly and folic acid 1 mg daily. He delayed starting the medication until start of this week due to concern about side effects. He was also discussing adjustment to antidepressant medication and concern about significant fatigue. He did notice this worse but not sure if related to the medication. No GI side effects or injection site rash. On review of the medication had concern about hair loss or fertility side effects.  Previous HPI 03/28/2022  Steve Flores is a 36 y.o. male here for evaluation and management of inflammatory arthritis, previously seen at [facility] rheumatology with inflammatory changes in wrists, MCPs, and PIPs. He has not been consistently established with medical insurance limiting original diagnosis and treatment.  He developed the joint inflammation symptoms starting since around 2017.  He saw [facility] rheumatology work-up at that time with positive RNP and SSA antibodies with proximal hand swelling and was recommended to start treatment.  He never actually got onto the medicine which appears to have been subcutaneous methotrexate due to insurance issues.  Since that time he has had a lot of persistent joint pains most severe in his hands and wrist but also with pain affecting his hips and feet.  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  He is also become disabled from work due to physical limitations especially with his  hands.  In addition to the pain swelling and stiffness he also describes tingling and numbness extending into the hands.  He cannot perform push-ups or other activities requiring wrist extension.  He is also unable to tightly grip items with either hand.   Labs reviewed 09/2020 ANA 1:1280 speckled RNP >8.0 SSA 6.6 dsDNA, SM, SSB neg RF neg CCP neg HLA-B27 neg HAV/HBV/HCV neg   Activities of Daily Living:  Patient reports morning stiffness for 1-3 hours.   Patient Reports nocturnal pain.  Difficulty dressing/grooming: Denies Difficulty climbing stairs: Denies Difficulty getting out of chair: Denies Difficulty using hands for taps, buttons, cutlery, and/or writing: Reports   Review of Systems  Constitutional:  Positive for fatigue.  HENT:  Negative for mouth sores and mouth dryness.   Eyes:  Negative for dryness.  Respiratory:  Positive for shortness of breath.   Cardiovascular:  Positive for chest pain. Negative for palpitations.  Gastrointestinal:  Negative for blood in stool, constipation and diarrhea.  Endocrine: Negative for increased urination.  Genitourinary:  Negative for involuntary urination.  Musculoskeletal:  Positive for joint pain, joint pain, joint swelling, myalgias, muscle weakness, morning stiffness, muscle tenderness and myalgias. Negative for gait problem.  Skin:  Negative for color change, rash, hair loss and sensitivity to sunlight.  Allergic/Immunologic: Negative for susceptible to infections.  Neurological:  Positive for dizziness and headaches.  Hematological:  Negative for swollen glands.  Psychiatric/Behavioral:  Positive for depressed mood  and sleep disturbance. The patient is nervous/anxious.     PMFS History:  Patient Active Problem List   Diagnosis Date Noted   Seronegative rheumatoid arthritis ([place]) 03/28/2022   High risk medication use 03/28/2022   Educated about COVID-19 virus infection 08/06/2019   Precordial chest pain 06/20/2019    Nonspecific abnormal electrocardiogram (ECG) (EKG) 06/20/2019   Bilateral carpal tunnel syndrome 01/10/2018   Laryngopharyngeal reflux (LPR) 11/04/2017   Persistent hoarseness 11/04/2017    Past Medical History:  Diagnosis Date   Abrasion of finger of right hand 12/28/2011   healing wound right index finger   Fracture of phalanx of finger 12/26/2011   right ring prox. phalanx   GERD (gastroesophageal reflux disease)    Inguinal hernia    Rheumatoid aortitis     Family History  Problem Relation Age of Onset   Rheum arthritis Mother    Sickle cell anemia Father    Asthma Sister    Healthy Son    Past Surgical History:  Procedure Laterality Date   INGUINAL HERNIA REPAIR Left 10/07/2015   Procedure: LAPAROSCOPIC LEFT  INGUINAL HERNIA;  Surgeon: Ralene Ok, MD;  Location: [facility];  Service: General;  Laterality: Left;   INSERTION OF MESH Left 10/07/2015   Procedure: INSERTION OF MESH;  Surgeon: Ralene Ok, MD;  Location: [facility];  Service: General;  Laterality: Left;   Social History   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     Immunization History  Administered Date(s) Administered   Tdap 10/10/2019     Objective: Vital Signs: BP 105/71 (BP Location: Left Arm, Patient Position: Sitting, Cuff Size: Normal)   Pulse 71   Resp 13   Ht 5' 10" (1.778 m)   Wt 187 lb 3.2 oz (84.9 kg)   BMI 26.86 kg/m    Physical Exam HENT:     Mouth/Throat:     Mouth: Mucous membranes are moist.     Pharynx: Oropharynx is clear.  Cardiovascular:     Rate and Rhythm: Normal rate and regular rhythm.  Pulmonary:     Effort: Pulmonary effort is normal.     Breath sounds: Normal breath sounds.  Lymphadenopathy:     Cervical: No cervical adenopathy.  Skin:    General: Skin is warm and dry.     Findings: No rash.  Neurological:     Mental Status: He is alert.  Psychiatric:        Mood and Affect: Mood normal.        Musculoskeletal Exam:   Shoulders full ROM sore with overhead abduction, no focal tenderness Elbows full ROM no tenderness or swelling Right wrist mild swelling and tenderness to pressure, right 2nd-3rd MCPs and 2nd PIP swelling and tenderness, tenderness of multiple MCPs and PIPs on left hand with probable chornic swelling no palpable effusion Knees full ROM no tenderness or swelling Ankles full ROM no tenderness or swelling Right 1st MTP soft tissue swelling and decreased dorsiflexion ROM  CDAI Exam: CDAI Score: 22  Patient Global: 50 mm; Provider Global: 40 mm Swollen: 5 ; Tender: 8  Joint Exam 04/27/2022      Right  Left  Wrist  Swollen Tender     MCP 2  Swollen Tender   Tender  MCP 3  Swollen Tender   Tender  MCP 4      Tender  PIP 2  Swollen Tender     PIP 3  Swollen Tender  Investigation: No additional findings.  Imaging: No results found.  Recent Labs: Lab Results  Component Value Date   WBC 4.7 04/27/2022   HGB 14.4 04/27/2022   PLT 217 04/27/2022   NA 137 04/27/2022   K 4.5 04/27/2022   CL 102 04/27/2022   CO2 29 04/27/2022   GLUCOSE 81 04/27/2022   BUN 16 04/27/2022   CREATININE 1.10 04/27/2022   BILITOT 0.5 04/27/2022   ALKPHOS 56 01/25/2022   AST 26 04/27/2022   ALT 20 04/27/2022   PROT 8.4 (H) 04/27/2022   ALBUMIN 4.2 01/25/2022   CALCIUM 9.4 04/27/2022   GFRAA >60 10/29/2019    Speciality Comments: No specialty comments available.  Procedures:  No procedures performed Allergies: Patient has no known allergies.   Assessment / Plan:     Visit Diagnoses: Seronegative rheumatoid arthritis ([place]) - Plan: Sedimentation rate  So far no significant change in symptoms with methotrexate but he has barely started the medication due to initial concerns about it.  We will recheck the sedimentation rate for disease activity monitoring.  Still appears to have high disease activity from exam and reported symptoms.  Plan to continue methotrexate 50 mg subcu weekly and folic  acid 1 mg daily.  High risk medication use - methotrexate 15 mg subcu weekly  - Plan: CBC with Differential/Platelet, COMPLETE METABOLIC PANEL WITH GFR  He had a lot of concern about side effects after talking with the pharmacist and reviewing the medication.  So far no major issues reported with actually taking it.  Checking CBC and CMP for medication monitoring today.  Spent today while reviewing the individual risks including pregnancy toxicity, alopecia, hepatotoxicity, or reactions like mucositis or serositis none of which are in evidence.  Bilateral carpal tunnel syndrome  I still suspect some numbness and hand symptoms are likely from carpal tunnel syndrome secondary to swelling with his rheumatoid arthritis.  Orders: Orders Placed This Encounter  Procedures   Sedimentation rate   CBC with Differential/Platelet   COMPLETE METABOLIC PANEL WITH GFR   No orders of the defined types were placed in this encounter.    Follow-Up Instructions: Return in about 2 months (around 06/27/2022) for RA on MTX f/u 22mo.   CCollier Salina MD  Note - This record has been created using [employer]  Chart creation errors have been sought, but may not always  have been located. Such creation errors do not reflect on  the standard of medical care.

## 2022-04-27 ENCOUNTER — Encounter: Payer: Self-pay | Admitting: Internal Medicine

## 2022-04-27 ENCOUNTER — Ambulatory Visit: Payer: Medicaid Other | Attending: Internal Medicine | Admitting: Internal Medicine

## 2022-04-27 VITALS — BP 105/71 | HR 71 | Resp 13 | Ht 70.0 in | Wt 187.2 lb

## 2022-04-27 DIAGNOSIS — M0600 Rheumatoid arthritis without rheumatoid factor, unspecified site: Secondary | ICD-10-CM | POA: Diagnosis not present

## 2022-04-27 DIAGNOSIS — Z79899 Other long term (current) drug therapy: Secondary | ICD-10-CM

## 2022-04-27 DIAGNOSIS — G5603 Carpal tunnel syndrome, bilateral upper limbs: Secondary | ICD-10-CM

## 2022-04-28 LAB — COMPLETE METABOLIC PANEL WITH GFR
AG Ratio: 0.9 (calc) — ABNORMAL LOW (ref 1.0–2.5)
ALT: 20 U/L (ref 9–46)
AST: 26 U/L (ref 10–40)
Albumin: 3.9 g/dL (ref 3.6–5.1)
Alkaline phosphatase (APISO): 57 U/L (ref 36–130)
BUN: 16 mg/dL (ref 7–25)
CO2: 29 mmol/L (ref 20–32)
Calcium: 9.4 mg/dL (ref 8.6–10.3)
Chloride: 102 mmol/L (ref 98–110)
Creat: 1.1 mg/dL (ref 0.60–1.26)
Globulin: 4.5 g/dL (calc) — ABNORMAL HIGH (ref 1.9–3.7)
Glucose, Bld: 81 mg/dL (ref 65–99)
Potassium: 4.5 mmol/L (ref 3.5–5.3)
Sodium: 137 mmol/L (ref 135–146)
Total Bilirubin: 0.5 mg/dL (ref 0.2–1.2)
Total Protein: 8.4 g/dL — ABNORMAL HIGH (ref 6.1–8.1)
eGFR: 89 mL/min/{1.73_m2} (ref >=60)

## 2022-04-28 LAB — CBC WITH DIFFERENTIAL/PLATELET
Absolute Monocytes: 733 cells/uL (ref 200–950)
Basophils Absolute: 42 cells/uL (ref 0–200)
Basophils Relative: 0.9 %
Eosinophils Absolute: 564 cells/uL — ABNORMAL HIGH (ref 15–500)
Eosinophils Relative: 12 %
HCT: 42.7 % (ref 38.5–50.0)
Hemoglobin: 14.4 g/dL (ref 13.2–17.1)
Lymphs Abs: 1495 cells/uL (ref 850–3900)
MCH: 28.1 pg (ref 27.0–33.0)
MCHC: 33.7 g/dL (ref 32.0–36.0)
MCV: 83.4 fL (ref 80.0–100.0)
MPV: 10.8 fL (ref 7.5–12.5)
Monocytes Relative: 15.6 %
Neutro Abs: 1866 cells/uL (ref 1500–7800)
Neutrophils Relative %: 39.7 %
Platelets: 217 10*3/uL (ref 140–400)
RBC: 5.12 10*6/uL (ref 4.20–5.80)
RDW: 14.7 % (ref 11.0–15.0)
Total Lymphocyte: 31.8 %
WBC: 4.7 10*3/uL (ref 3.8–10.8)

## 2022-04-28 LAB — SEDIMENTATION RATE: Sed Rate: 31 mm/h — ABNORMAL HIGH (ref 0–15)

## 2022-04-30 NOTE — Progress Notes (Signed)
No lab changes concerning for side effects of methotrexate.  His sedimentation rate test is 31 slightly more elevated than 22 a month ago but it is definitely too early to judge the methotrexate effectiveness.

## 2022-05-18 ENCOUNTER — Other Ambulatory Visit: Payer: Self-pay | Admitting: Internal Medicine

## 2022-05-18 DIAGNOSIS — M0600 Rheumatoid arthritis without rheumatoid factor, unspecified site: Secondary | ICD-10-CM

## 2022-05-21 NOTE — Telephone Encounter (Signed)
Next Visit: 07/06/2022  Last Visit: 04/27/2022  Last Fill: 03/28/2022  DX: Seronegative rheumatoid arthritis   Current Dose per office note 04/27/2022: methotrexate 15 mg subcu weekly   Labs: 04/30/2022 No lab changes concerning for side effects of methotrexate.  His sedimentation rate test is 31 slightly more elevated than 22 a month ago but it is definitely too early to judge the methotrexate effectiveness.  Okay to refill methotrexate?

## 2022-07-05 NOTE — Progress Notes (Signed)
Office Visit Note  Patient: Steve Flores             Date of Birth: 08/29/1985           MRN: [id number]             PCP: Scheryl Marten, PA Referring: Scheryl Marten, [place] Visit Date: 07/06/2022   Subjective:  Follow-up   History of Present Illness: Steve Flores is a 36 y.o. male here for follow up for seronegative RA on MTX 15 mg [place] weekly and folic acid 1 mg daily and probable bilateral carpal tunnel syndrome.  Does feel a benefit in his symptoms the swelling is much better though he still has joint pain and stiffness pretty frequently.  He has noticed a pattern of more symptom improvement about every other week after his methotrexate dose but not as much on the other half of doses.  He notices pain in both shoulders.  Feels decreased grip strength in his hands worse on the right side, often have more pain after activities like basketball.  He feels some weakness in his knees and but not really painful and does not see any swelling.  Previous HPI 04/27/22 Steve Flores is a 36 y.o. male here for follow up for seronegative RA on MTX 15 mg [place] weekly and folic acid 1 mg daily. He delayed starting the medication until start of this week due to concern about side effects. He was also discussing adjustment to antidepressant medication and concern about significant fatigue. He did notice this worse but not sure if related to the medication. No GI side effects or injection site rash. On review of the medication had concern about hair loss or fertility side effects.   Previous HPI 03/28/2022 Steve Flores is a 36 y.o. male here for evaluation and management of inflammatory arthritis, previously seen at [facility] rheumatology with inflammatory changes in wrists, MCPs, and PIPs. He has not been consistently established with medical insurance limiting original diagnosis and treatment.  He developed the joint inflammation symptoms starting since around 2017.  He saw [facility] rheumatology  work-up at that time with positive RNP and SSA antibodies with proximal hand swelling and was recommended to start treatment.  He never actually got onto the medicine which appears to have been subcutaneous methotrexate due to insurance issues.  Since that time he has had a lot of persistent joint pains most severe in his hands and wrist but also with pain affecting his hips and feet.  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  He is also become disabled from work due to physical limitations especially with his hands.  In addition to the pain swelling and stiffness he also describes tingling and numbness extending into the hands.  He cannot perform push-ups or other activities requiring wrist extension.  He is also unable to tightly grip items with either hand.   Labs reviewed 09/2020 ANA 1:1280 speckled RNP >8.0 SSA 6.6 dsDNA, SM, SSB neg RF neg CCP neg HLA-B27 neg HAV/HBV/HCV neg   Review of Systems  Constitutional:  Positive for fatigue.  HENT:  Positive for mouth dryness. Negative for mouth sores.   Eyes:  Negative for dryness.  Respiratory:  Positive for shortness of breath.   Cardiovascular:  Positive for chest pain and palpitations.  Gastrointestinal:  Negative for blood in stool, constipation  and diarrhea.  Endocrine: Negative for increased urination.  Genitourinary:  Negative for involuntary urination.  Musculoskeletal:  Positive for joint pain, joint pain, myalgias, muscle weakness, morning stiffness, muscle tenderness and myalgias. Negative for gait problem and joint swelling.  Skin:  Negative for color change, rash, hair loss and sensitivity to sunlight.  Allergic/Immunologic: Negative for susceptible to infections.  Neurological:  Positive for dizziness and headaches.  Hematological:  Negative for swollen glands.   Psychiatric/Behavioral:  Positive for depressed mood and sleep disturbance. The patient is nervous/anxious.     PMFS History:  Patient Active Problem List   Diagnosis Date Noted   Seronegative rheumatoid arthritis ([place]) 03/28/2022   High risk medication use 03/28/2022   Educated about COVID-19 virus infection 08/06/2019   Precordial chest pain 06/20/2019   Nonspecific abnormal electrocardiogram (ECG) (EKG) 06/20/2019   Bilateral carpal tunnel syndrome 01/10/2018   Laryngopharyngeal reflux (LPR) 11/04/2017   Persistent hoarseness 11/04/2017    Past Medical History:  Diagnosis Date   Abrasion of finger of right hand 12/28/2011   healing wound right index finger   Fracture of phalanx of finger 12/26/2011   right ring prox. phalanx   GERD (gastroesophageal reflux disease)    Inguinal hernia    Rheumatoid aortitis     Family History  Problem Relation Age of Onset   Rheum arthritis Mother    Sickle cell anemia Father    Asthma Sister    Healthy Son    Past Surgical History:  Procedure Laterality Date   INGUINAL HERNIA REPAIR Left 10/07/2015   Procedure: LAPAROSCOPIC LEFT  INGUINAL HERNIA;  Surgeon: Ralene Ok, MD;  Location: [facility];  Service: General;  Laterality: Left;   INSERTION OF MESH Left 10/07/2015   Procedure: INSERTION OF MESH;  Surgeon: Ralene Ok, MD;  Location: [facility];  Service: General;  Laterality: Left;   Social History   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     Immunization History  Administered Date(s) Administered   Tdap 10/10/2019     Objective: Vital Signs: BP 123/84 (BP Location: Right Arm, Patient Position: Sitting, Cuff Size: Large)   Pulse 76   Resp 17   Ht _0  (1.778 m)   Wt 188 lb 12.8 oz (85.6 kg)   BMI 27.09 kg/m    Physical Exam Eyes:     Conjunctiva/sclera: Conjunctivae normal.  Cardiovascular:     Rate and Rhythm: Normal rate and regular rhythm.  Pulmonary:     Effort:  Pulmonary effort is normal.     Breath sounds: Normal breath sounds.  Lymphadenopathy:     Cervical: No cervical adenopathy.  Skin:    General: Skin is warm and dry.  Neurological:     Mental Status: He is alert.  Psychiatric:        Mood and Affect: Mood normal.      Musculoskeletal Exam:  Shoulders full ROM soreness with overhead abduction, no tenderness to palpation or palpable swelling Elbows full ROM no tenderness or swelling Wrists full ROM no tenderness or swelling Right hand MCP joints with tenderness to pressure and in flexion range of motion mild synovitis is present to palpation, left hand MCP joint tenderness without palpable swelling Knees full ROM no tenderness or swelling   CDAI Exam: CDAI Score: 21  Patient Global: 20 mm; Provider Global: 30 mm Swollen: 4 ; Tender: 12  Joint Exam 07/06/2022      Right  Left  Glenohumeral  Tender   Tender  MCP 2  Swollen Tender   Tender  MCP 3  Swollen Tender   Tender  MCP 4  Swollen Tender   Tender  MCP 5  Swollen Tender   Tender  PIP 2   Tender     PIP 3   Tender        Investigation: No additional findings.  Imaging: No results found.  Recent Labs: Lab Results  Component Value Date   WBC 4.7 04/27/2022   HGB 14.4 04/27/2022   PLT 217 04/27/2022   NA 137 04/27/2022   K 4.5 04/27/2022   CL 102 04/27/2022   CO2 29 04/27/2022   GLUCOSE 81 04/27/2022   BUN 16 04/27/2022   CREATININE 1.10 04/27/2022   BILITOT 0.5 04/27/2022   ALKPHOS 56 01/25/2022   AST 26 04/27/2022   ALT 20 04/27/2022   PROT 8.4 (H) 04/27/2022   ALBUMIN 4.2 01/25/2022   CALCIUM 9.4 04/27/2022   GFRAA >60 10/29/2019    Speciality Comments: No specialty comments available.  Procedures:  No procedures performed Allergies: Patient has no known allergies.   Assessment / Plan:     Visit Diagnoses: Seronegative rheumatoid arthritis ([place])  Partial response to methotrexate so far still appears to have disease activity.  Some objective  synovitis present on exam today not a tremendous improvement so far  Will recheck sedimentation rate for disease activity monitoring as his has been elevated. May need additional medication in the future based on response so far. If no contraindications would recommend trying titration of methotrexate to 20 mg subcu weekly and continue folic acid 1 mg daily.  Can continue the meloxicam 15 mg daily or other NSAID as needed.  Bilateral carpal tunnel syndrome  Comments numbness or characteristic overnight or morning symptoms now.  Reported grip difficulty in his hands I suspect more related to the joints and arthritis versus the nerve currently.  High risk medication use  Checking CBC and CMP for methotrexate toxicity monitoring.  So far he is tolerating the injection without significant side effects and no major infectious events reported.  Orders: Orders Placed This Encounter  Procedures   Sedimentation rate   CBC with Differential/Platelet   COMPLETE METABOLIC PANEL WITH GFR   No orders of the defined types were placed in this encounter.    Follow-Up Instructions: Return in about 3 months (around 10/05/2022) for RA on MTX f/u 31mo.   CCollier Salina MD  Note - This record has been created using [employer]  Chart creation errors have been sought, but may not always  have been located. Such creation errors do not reflect on  the standard of medical care.

## 2022-07-06 ENCOUNTER — Encounter: Payer: Self-pay | Admitting: Internal Medicine

## 2022-07-06 ENCOUNTER — Ambulatory Visit: Payer: Medicaid Other | Attending: Internal Medicine | Admitting: Internal Medicine

## 2022-07-06 VITALS — BP 123/84 | HR 76 | Resp 17 | Ht 70.0 in | Wt 188.8 lb

## 2022-07-06 DIAGNOSIS — M0600 Rheumatoid arthritis without rheumatoid factor, unspecified site: Secondary | ICD-10-CM | POA: Diagnosis not present

## 2022-07-06 DIAGNOSIS — G5603 Carpal tunnel syndrome, bilateral upper limbs: Secondary | ICD-10-CM

## 2022-07-06 DIAGNOSIS — Z79899 Other long term (current) drug therapy: Secondary | ICD-10-CM | POA: Diagnosis not present

## 2022-07-07 LAB — COMPLETE METABOLIC PANEL WITHOUT GFR
AG Ratio: 0.9 (calc) — ABNORMAL LOW (ref 1.0–2.5)
ALT: 30 U/L (ref 9–46)
AST: 24 U/L (ref 10–40)
Albumin: 4 g/dL (ref 3.6–5.1)
Alkaline phosphatase (APISO): 59 U/L (ref 36–130)
BUN: 11 mg/dL (ref 7–25)
CO2: 32 mmol/L (ref 20–32)
Calcium: 10 mg/dL (ref 8.6–10.3)
Chloride: 102 mmol/L (ref 98–110)
Creat: 1.09 mg/dL (ref 0.60–1.26)
Globulin: 4.5 g/dL — ABNORMAL HIGH (ref 1.9–3.7)
Glucose, Bld: 76 mg/dL (ref 65–99)
Potassium: 4.5 mmol/L (ref 3.5–5.3)
Sodium: 138 mmol/L (ref 135–146)
Total Bilirubin: 0.5 mg/dL (ref 0.2–1.2)
Total Protein: 8.5 g/dL — ABNORMAL HIGH (ref 6.1–8.1)
eGFR: 90 mL/min/1.73m2

## 2022-07-07 LAB — CBC WITH DIFFERENTIAL/PLATELET
Absolute Monocytes: 864 {cells}/uL (ref 200–950)
Basophils Absolute: 19 {cells}/uL (ref 0–200)
Basophils Relative: 0.4 %
Eosinophils Absolute: 427 {cells}/uL (ref 15–500)
Eosinophils Relative: 8.9 %
HCT: 43.8 % (ref 38.5–50.0)
Hemoglobin: 14.8 g/dL (ref 13.2–17.1)
Lymphs Abs: 1344 {cells}/uL (ref 850–3900)
MCH: 28.3 pg (ref 27.0–33.0)
MCHC: 33.8 g/dL (ref 32.0–36.0)
MCV: 83.7 fL (ref 80.0–100.0)
MPV: 11.2 fL (ref 7.5–12.5)
Monocytes Relative: 18 %
Neutro Abs: 2146 {cells}/uL (ref 1500–7800)
Neutrophils Relative %: 44.7 %
Platelets: 232 Thousand/uL (ref 140–400)
RBC: 5.23 Million/uL (ref 4.20–5.80)
RDW: 15.1 % — ABNORMAL HIGH (ref 11.0–15.0)
Total Lymphocyte: 28 %
WBC: 4.8 Thousand/uL (ref 3.8–10.8)

## 2022-07-07 LAB — SEDIMENTATION RATE: Sed Rate: 29 mm/h — ABNORMAL HIGH (ref 0–15)

## 2022-07-09 NOTE — Progress Notes (Signed)
Lab results look fine on methotrexate. I recommend he try increasing the dose to 20 mg as planned. Sedimentation rate remains mildly high at 29.

## 2022-08-02 IMAGING — CT CT CERVICAL SPINE W/O CM
3 of 4 series · 13 of 33 positions shown, 16 images · non-contrast
Comparison: None.

CLINICAL DATA: Neck pain with right-sided radiculopathy

EXAM:
CT CERVICAL SPINE WITHOUT CONTRAST
TECHNIQUE: Multidetector CT imaging of the cervical spine was performed without
intravenous contrast. Multiplanar CT image reconstructions were also
generated.

[Series 7: orthogonal bone · axial · 0.27mm/px · z∈[-254,-123]mm · 5 of 104 slices shown, 7 images]
[im 18/104  soft-tissue]
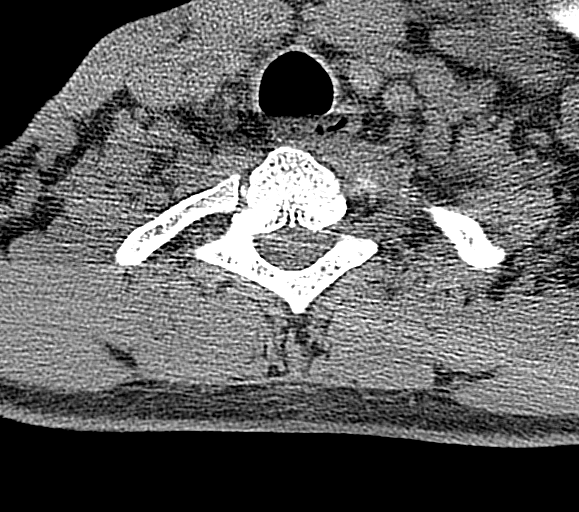
[im 18/104  bone]
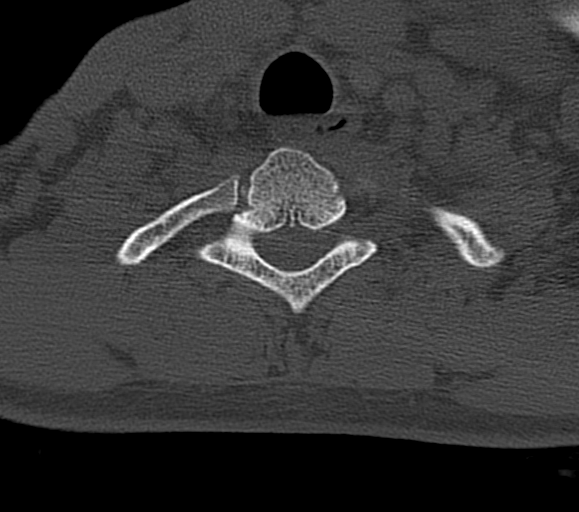
[im 35/104  bone]
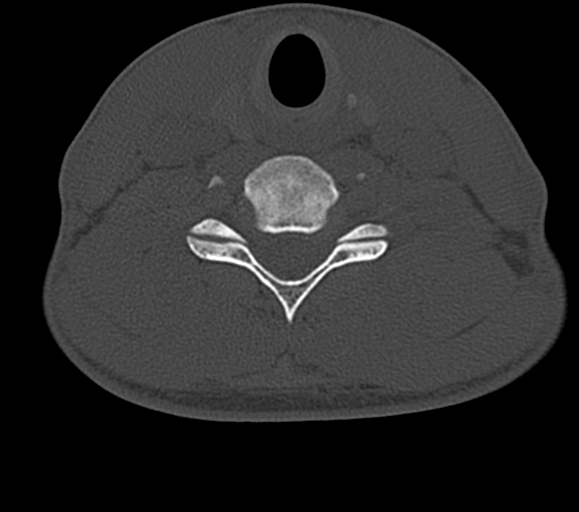
[im 52/104  bone]
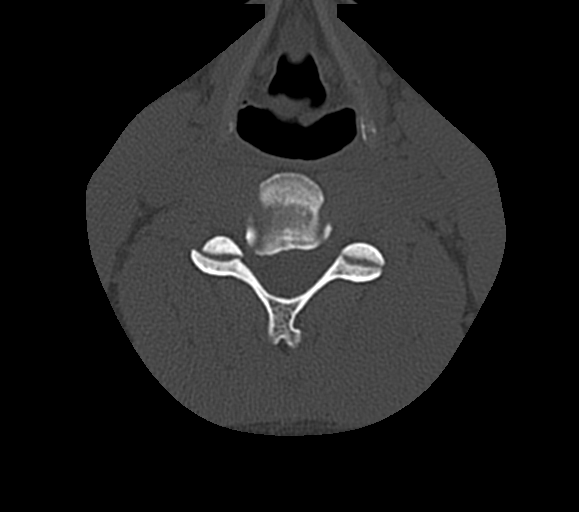
[im 69/104  bone]
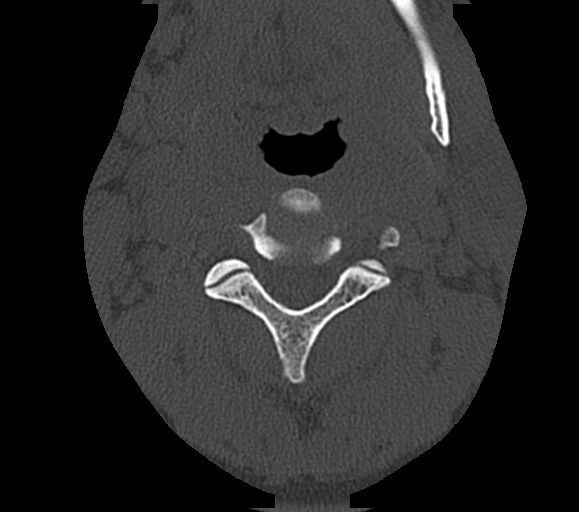
[im 86/104  soft-tissue]
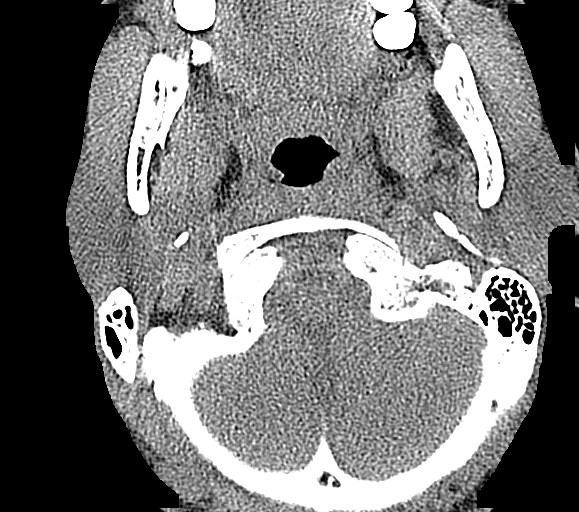
[im 86/104  bone]
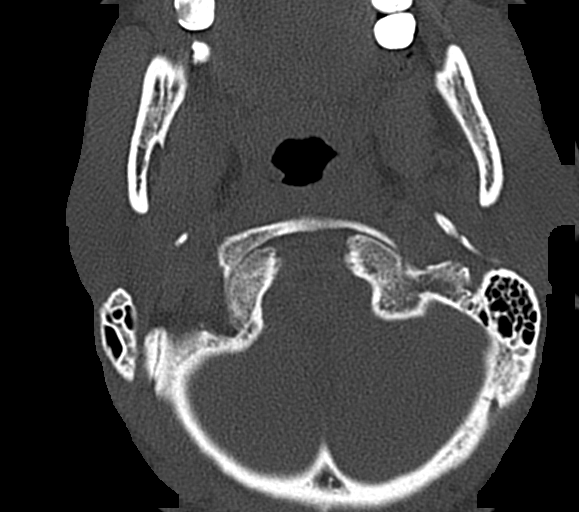

[Series 8: coronal bone · coronal · 0.30mm/px · 3 of 69 slices shown]
[im 14/69  bone]
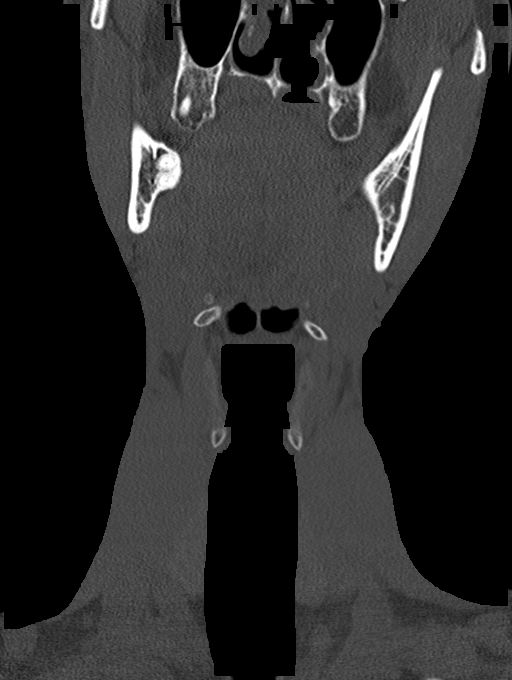
[im 28/69  bone]
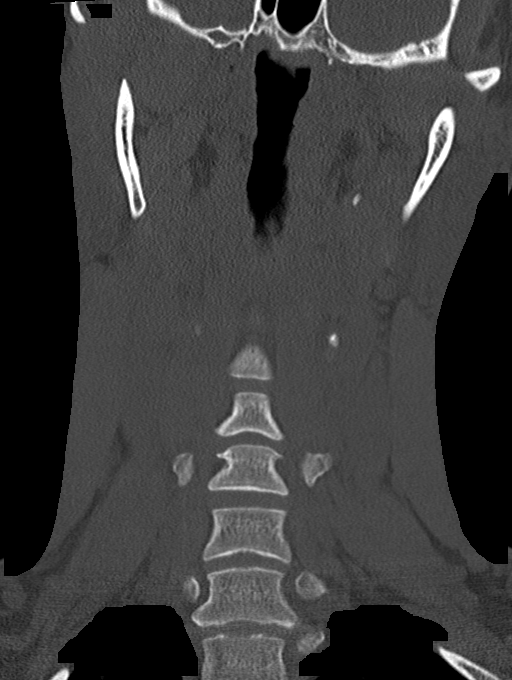
[im 41/69  bone]
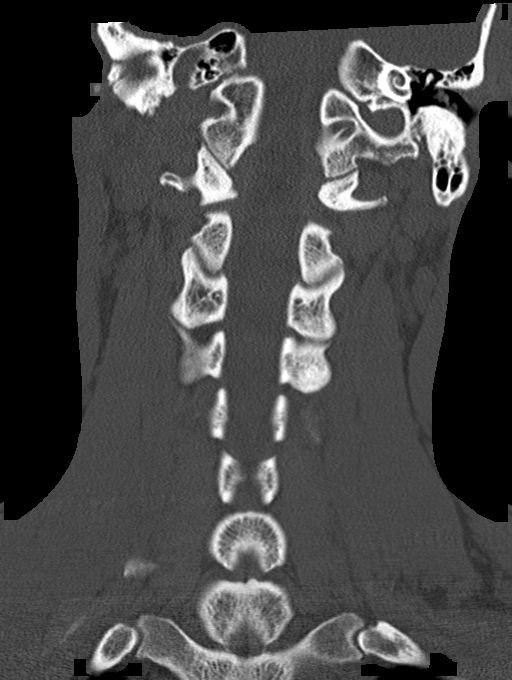

[Series 9: sagittal bone · sagittal · 0.33mm/px · 5 of 61 slices shown, 6 images]
[im 21/61  bone]
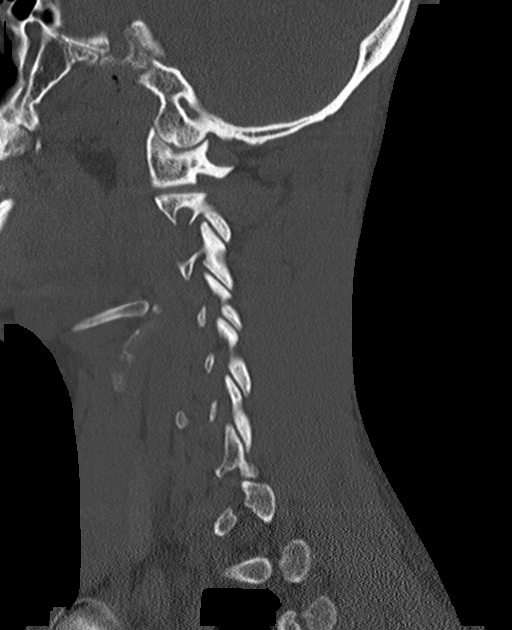
[im 26/61  bone]
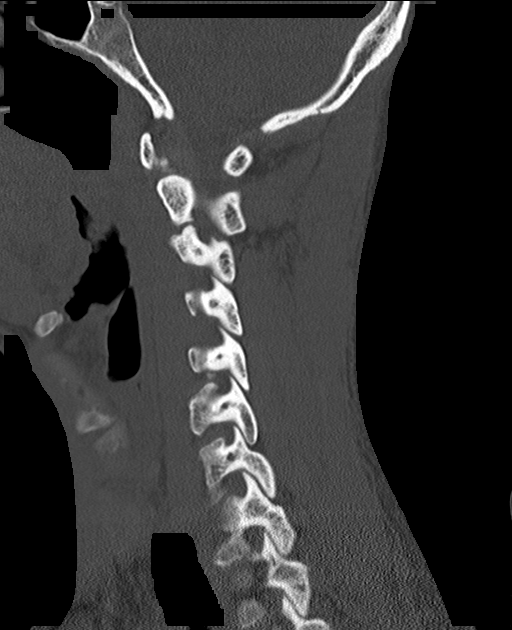
[im 31/61  soft-tissue]
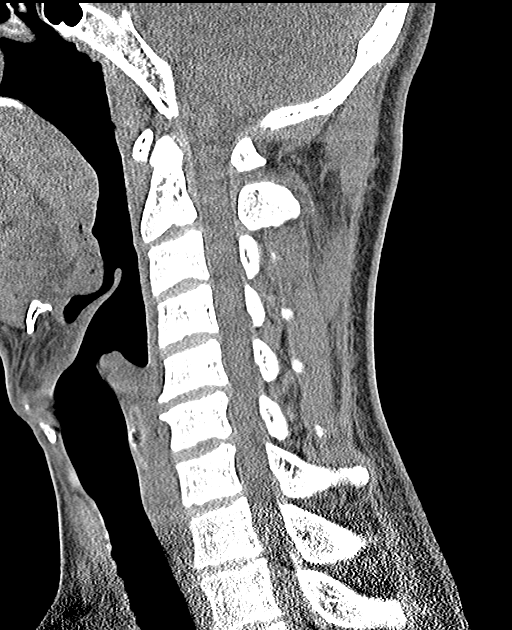
[im 31/61  bone]
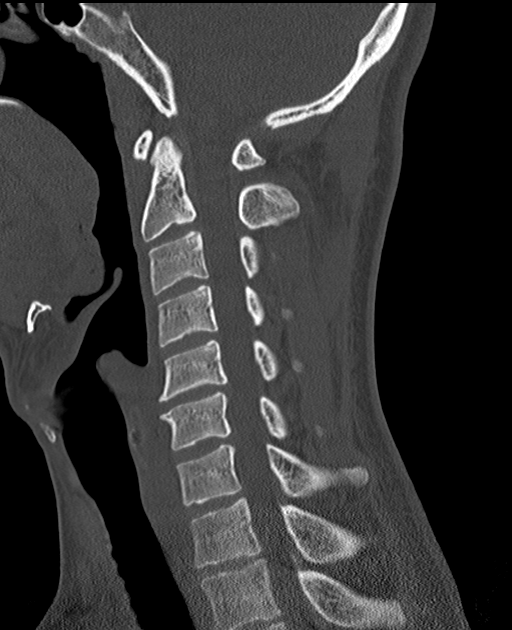
[im 36/61  bone]
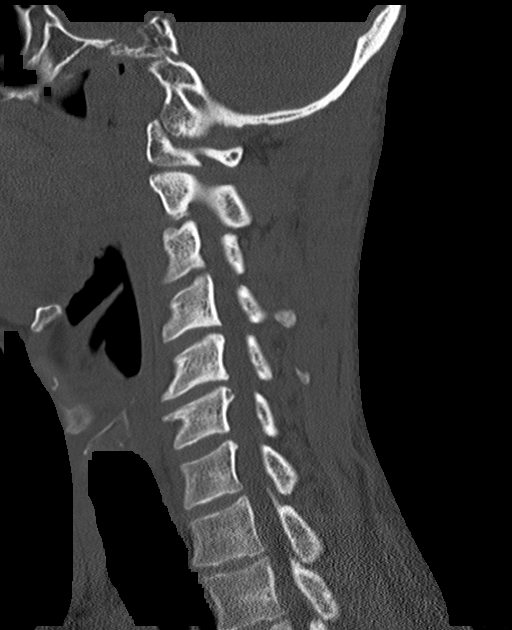
[im 41/61  bone]
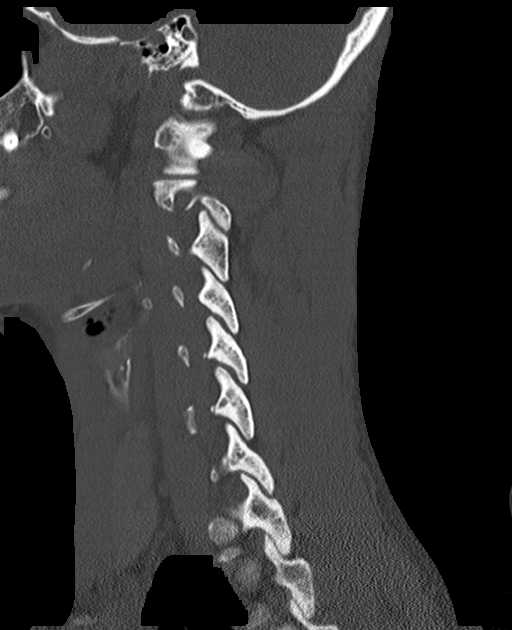

[13 of 33 positions shown; findings below may reference images not displayed]

FINDINGS: Alignment: Facet joints are aligned without dislocation or traumatic
listhesis. Dens and lateral masses are aligned. Straightening of the
cervical lordosis.

Skull base and vertebrae: No acute fracture. No primary bone lesion
or focal pathologic process.

Soft tissues and spinal canal: No prevertebral fluid or swelling. No
visible canal hematoma.

Disc levels: Intervertebral disc spaces are preserved. Unremarkable
facet joints. Uncovertebral spurring results in mild right-sided
foraminal stenosis at C4-5 (series 7, image 55). Borderline
right-sided foraminal narrowing is also present at C5-6 related to
uncovertebral spurring. No evidence of canal stenosis by CT.

Upper chest: Visualized lung apices clear.

Other: Minimal mucosal thickening within the inferior aspect of the
left maxillary sinus. Mildly prominent bilateral cervical chain
lymph nodes, nonspecific.
IMPRESSION: 1. No acute fracture or traumatic listhesis of the cervical spine.
2. Uncovertebral spurring results in mild right-sided foraminal
stenosis at C4-5 and borderline right-sided foraminal narrowing at
C5-6. No evidence of canal stenosis by CT.

## 2022-08-14 ENCOUNTER — Other Ambulatory Visit: Payer: Self-pay | Admitting: *Deleted

## 2022-08-14 DIAGNOSIS — M0600 Rheumatoid arthritis without rheumatoid factor, unspecified site: Secondary | ICD-10-CM

## 2022-08-14 MED ORDER — METHOTREXATE SODIUM CHEMO INJECTION (PF) 50 MG/2ML: INTRAMUSCULAR | 2 refills | Status: DC

## 2022-08-14 NOTE — Telephone Encounter (Signed)
Patient contacted the office and request refill on MTX.  Next Visit: 10/12/2022  Last Visit: 07/06/2022  Last Fill: 05/21/2022  DX: Seronegative rheumatoid arthritis   Current Dose per office note 07/06/2022: methotrexate to 20 mg subcu weekly   Labs: 07/06/2022 Lab results look fine on methotrexate.   Okay to refill MTX?

## 2022-08-21 ENCOUNTER — Other Ambulatory Visit: Payer: Self-pay | Admitting: Internal Medicine

## 2022-08-21 ENCOUNTER — Ambulatory Visit
Admission: RE | Admit: 2022-08-21 | Discharge: 2022-08-21 | Disposition: A | Discharge status: Home / Self Care | Payer: Medicaid Other | Source: Ambulatory Visit | Attending: Internal Medicine | Admitting: Internal Medicine

## 2022-08-21 DIAGNOSIS — M25511 Pain in right shoulder: Secondary | ICD-10-CM

## 2022-10-12 ENCOUNTER — Ambulatory Visit: Payer: Medicaid Other | Attending: Internal Medicine | Admitting: Internal Medicine

## 2022-10-12 ENCOUNTER — Encounter: Payer: Self-pay | Admitting: Internal Medicine

## 2022-10-12 VITALS — BP 96/70 | HR 67 | Resp 14 | Ht 70.0 in | Wt 186.0 lb

## 2022-10-12 DIAGNOSIS — Z79899 Other long term (current) drug therapy: Secondary | ICD-10-CM

## 2022-10-12 DIAGNOSIS — M238X9 Other internal derangements of unspecified knee: Secondary | ICD-10-CM

## 2022-10-12 DIAGNOSIS — M0600 Rheumatoid arthritis without rheumatoid factor, unspecified site: Secondary | ICD-10-CM | POA: Diagnosis not present

## 2022-10-12 DIAGNOSIS — M75111 Incomplete rotator cuff tear or rupture of right shoulder, not specified as traumatic: Secondary | ICD-10-CM | POA: Diagnosis not present

## 2022-10-12 DIAGNOSIS — M75101 Unspecified rotator cuff tear or rupture of right shoulder, not specified as traumatic: Secondary | ICD-10-CM | POA: Insufficient documentation

## 2022-10-12 MED ORDER — FOLIC ACID 1 MG PO TABS: 1.0000 mg | ORAL_TABLET | Freq: Every day | ORAL | 3 refills | Status: DC

## 2022-10-12 MED ORDER — "TUBERCULIN SYRINGE 26G X 3/8"" 1 ML MISC": 0 refills | Status: DC

## 2022-10-12 MED ORDER — METHOTREXATE SODIUM CHEMO INJECTION (PF) 50 MG/2ML: INTRAMUSCULAR | 2 refills | Status: DC

## 2022-10-12 NOTE — Progress Notes (Signed)
Office Visit Note  Patient: Steve Flores             Date of Birth: [birth year]           MRN: [id number]             PCP: Scheryl Marten, PA Referring: Scheryl Marten, [place] Visit Date: 10/12/2022   Subjective:  Follow-up (Improvement)   History of Present Illness: Steve Flores is a 37 y.o. male here for follow up for seronegative RA on methotrexate 20 mg subcu weekly folic acid 1 mg daily.  Overall symptoms are partially improved compared to last visit.  Still having some intolerance to methotrexate but not as bad.  Had steroid injection in the right shoulder with symptom benefit with [name] about 2 weeks ago.  Still taking meloxicam last refill in January so not on every day.  He is very fatigued today associated with less sleep pain for midterms studying in [employer]inity school with [employer].  He has been noticing some more clicking or popping sensation affecting his knees while climbing stairs.  Previous HPI 07/06/22 Steve Flores is a 37 y.o. male here for follow up for seronegative RA on MTX 15 mg [place] weekly and folic acid 1 mg daily and probable bilateral carpal tunnel syndrome.  Does feel a benefit in his symptoms the swelling is much better though he still has joint pain and stiffness pretty frequently.  He has noticed a pattern of more symptom improvement about every other week after his methotrexate dose but not as much on the other half of doses.  He notices pain in both shoulders.  Feels decreased grip strength in his hands worse on the right side, often have more pain after activities like basketball.  He feels some weakness in his knees and but not really painful and does not see any swelling.   Previous HPI 04/27/22 Steve Flores is a 37 y.o. male here for follow up for seronegative RA on MTX 15 mg  weekly and folic acid 1 mg daily. He delayed starting the medication until start of this week due to concern about side effects. He was also discussing adjustment  to antidepressant medication and concern about significant fatigue. He did notice this worse but not sure if related to the medication. No GI side effects or injection site rash. On review of the medication had concern about hair loss or fertility side effects.   Previous HPI 03/28/2022 Steve Flores is a 37 y.o. male here for evaluation and management of inflammatory arthritis, previously seen at [facility] rheumatology with inflammatory changes in wrists, MCPs, and PIPs. He has not been consistently established with medical insurance limiting original diagnosis and treatment.  He developed the joint inflammation symptoms starting since around 2017.  He saw [facility] rheumatology work-up at that time with positive RNP and SSA antibodies with proximal hand swelling and was recommended to start treatment.  He never actually got onto the medicine which appears to have been subcutaneous methotrexate due to insurance issues.  Since that time he has had a lot of persistent joint pains most severe in his hands and wrist but also with pain affecting his hips and feet.  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  He is also become disabled from work due to physical limitations especially with his hands.  In addition to the pain swelling and stiffness he also describes tingling and numbness extending into the hands.  He cannot perform push-ups or other activities requiring wrist extension.  He is also unable to tightly grip items with either hand.   Labs reviewed 09/2020 ANA 1:1280 speckled RNP >8.0 SSA 6.6 dsDNA, SM, SSB neg RF neg CCP neg HLA-B27 neg HAV/HBV/HCV neg   Review of Systems  Constitutional:  Positive for fatigue.  HENT:  Negative for mouth sores and mouth dryness.   Eyes:  Positive for dryness.  Respiratory:  Negative for shortness of breath.    Cardiovascular:  Positive for chest pain and palpitations.  Gastrointestinal:  Negative for blood in stool, constipation and diarrhea.  Endocrine: Negative for increased urination.  Genitourinary:  Negative for involuntary urination.  Musculoskeletal:  Positive for joint pain, joint pain, joint swelling, myalgias, muscle weakness, morning stiffness, muscle tenderness and myalgias. Negative for gait problem.  Skin:  Negative for color change, rash, hair loss and sensitivity to sunlight.  Allergic/Immunologic: Negative for susceptible to infections.  Neurological:  Positive for dizziness. Negative for headaches.  Hematological:  Negative for swollen glands.  Psychiatric/Behavioral:  Positive for depressed mood and sleep disturbance. The patient is nervous/anxious.     PMFS History:  Patient Active Problem List   Diagnosis Date Noted   Unspecified rotator cuff tear or rupture of right shoulder, not specified as traumatic 10/12/2022   Patellofemoral crepitus 10/12/2022   Seronegative rheumatoid arthritis ([place]) 03/28/2022   High risk medication use 03/28/2022   Educated about COVID-19 virus infection 08/06/2019   Precordial chest pain 06/20/2019   Nonspecific abnormal electrocardiogram (ECG) (EKG) 06/20/2019   Bilateral carpal tunnel syndrome 01/10/2018   Laryngopharyngeal reflux (LPR) 11/04/2017   Persistent hoarseness 11/04/2017    Past Medical History:  Diagnosis Date   Abrasion of finger of right hand 12/28/2011   healing wound right index finger   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 phalanx   GERD (gastroesophageal reflux disease)    Inguinal hernia    Rheumatoid aortitis     Family History  Problem Relation Age of Onset   Rheum arthritis Mother    Sickle cell anemia Father    Asthma Sister    Healthy Son    Past Surgical History:  Procedure Laterality Date   INGUINAL HERNIA REPAIR Left 10/07/2015    Procedure: LAPAROSCOPIC LEFT  INGUINAL HERNIA;  Surgeon: Ralene Ok, MD;  Location: [facility];  Service: General;  Laterality: Left;   INSERTION OF MESH Left 10/07/2015   Procedure: INSERTION OF MESH;  Surgeon: Ralene Ok, MD;  Location: [facility];  Service: General;  Laterality: Left;   Social History   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     Immunization History  Administered Date(s) Administered   Tdap 10/10/2019     Objective: Vital Signs: BP 96/70 (BP Location: Left Arm, Patient Position: Sitting, Cuff Size: Normal)   Pulse 67   Resp 14   Ht 5\' 10"  (1.778 m)   Wt 186 lb (84.4 kg)   BMI 26.69 kg/m    Physical Exam Eyes:     Conjunctiva/sclera: Conjunctivae normal.  Cardiovascular:     Rate and Rhythm: Normal rate and regular rhythm.  Pulmonary:     Effort: Pulmonary effort is normal.     Breath sounds: Normal breath sounds.  Musculoskeletal:  Right lower leg: No edema.     Left lower leg: No edema.  Lymphadenopathy:     Cervical: No cervical adenopathy.  Skin:    General: Skin is warm and dry.     Findings: No rash.  Neurological:     Mental Status: He is alert.  Psychiatric:        Mood and Affect: Mood normal.      Musculoskeletal Exam:  Right shoulder guarding and painful arc, external rotation and flexion and extension ROM normal, tenderness to pressure worst lateral and anterior Elbows full ROM no tenderness or swelling Right wrist tenderness to pressure and with movement, no palpable swelling, left wrist normal Right 2nd-4th PIPs tenderness and pain limiting full flexion grip about 80% intact No paraspinal tenderness to palpation over upper and lower back Hip normal internal and external rotation without pain, no tenderness to lateral hip palpation Knees full ROM no tenderness to palpation right knee trace effusion present, patellofemoral crepitus present, negative patellar grind test and no appreciable  laxity Ankles full ROM no tenderness or swelling   CDAI Exam: CDAI Score: 11  Patient Global: 30 mm; Provider Global: 20 mm Swollen: 1 ; Tender: 5  Joint Exam 10/12/2022      Right  Left  Glenohumeral   Tender     Wrist   Tender     PIP 2   Tender     PIP 3   Tender     PIP 4   Tender     Knee  Swollen         Investigation: No additional findings.  Imaging: No results found.  Recent Labs: Lab Results  Component Value Date   WBC 4.8 07/06/2022   HGB 14.8 07/06/2022   PLT 232 07/06/2022   NA 138 07/06/2022   K 4.5 07/06/2022   CL 102 07/06/2022   CO2 32 07/06/2022   GLUCOSE 76 07/06/2022   BUN 11 07/06/2022   CREATININE 1.09 07/06/2022   BILITOT 0.5 07/06/2022   ALKPHOS 56 01/25/2022   AST 24 07/06/2022   ALT 30 07/06/2022   PROT 8.5 (H) 07/06/2022   ALBUMIN 4.2 01/25/2022   CALCIUM 10.0 07/06/2022   GFRAA >60 10/29/2019    Speciality Comments: No specialty comments available.  Procedures:  No procedures performed Allergies: Patient has no known allergies.   Assessment / Plan:     Visit Diagnoses: Seronegative rheumatoid arthritis ([place]) - Plan: Sedimentation rate, Methotrexate Sodium (METHOTREXATE, PF,) 50 MG/2ML injection, TUBERCULIN SYR 1CC/26GX3/8" 26G X 3/8" 1 ML MISC, folic acid (FOLVITE) 1 MG tablet  Inflammatory arthritis not in remission but symptoms are improving and appear to be more tolerable currently.  Rechecking sedimentation rate for disease activity monitoring.  At this time would not strongly push for initiating biologic therapy or combination therapy.  Plan to continue methotrexate 20 mg subcu weekly folic acid 1 mg daily.  High risk medication use - Plan: CBC with Differential/Platelet, COMPLETE METABOLIC PANEL WITH GFR  Checking CBC and CMP for medication monitoring on continued use of methotrexate.  Has not had any significant interval infections.  Still getting generalized fatigue and achiness after he takes each dose of medication  but less severe compared to our last visit.  Incomplete tear of right rotator cuff, unspecified whether traumatic  Recent intra-articular steroid injection for right shoulder pain and stiffness with [name] about 2 weeks ago.  So far has had a partial improvement.  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  Patellofemoral crepitus  Discussed patellofemoral crepitus he is mostly noticing sensation with climbing stairs.  No appreciable laxity or structural abnormality.  There is a small effusion in the right knee that could be contributing on that side.  Not a major concern but discussed proximal leg strengthening exercises as an option for improving knee stability.  Orders: Orders Placed This Encounter  Procedures   Sedimentation rate   CBC with Differential/Platelet   COMPLETE METABOLIC PANEL WITH GFR   Meds ordered this encounter  Medications   Methotrexate Sodium (METHOTREXATE, PF,) 50 MG/2ML injection    Sig: Inject 0.8 mL into the skin once weekly.    Dispense:  8 mL    Refill:  2   TUBERCULIN SYR 1CC/26GX3/8" 26G X 3/8" 1 ML MISC    Sig: For injection of subcutaneous methotrexate once weekly    Dispense:  25 each    Refill:  0    Pharmacy may substitute for equivalent volume syringes and equipment eg needles as needed   folic acid (FOLVITE) 1 MG tablet    Sig: Take 1 tablet (1 mg total) by mouth daily.    Dispense:  90 tablet    Refill:  3     Follow-Up Instructions: No follow-ups on file.   Collier Salina, MD  Note - This record has been created using [employer].  Chart creation errors have been sought, but may not always  have been located. Such creation errors do not reflect on  the standard of medical care.

## 2022-10-13 LAB — CBC WITH DIFFERENTIAL/PLATELET
Absolute Monocytes: 628 cells/uL (ref 200–950)
Basophils Absolute: 22 cells/uL (ref 0–200)
Basophils Relative: 0.5 %
Eosinophils Absolute: 271 cells/uL (ref 15–500)
Eosinophils Relative: 6.3 %
HCT: 44.2 % (ref 38.5–50.0)
Hemoglobin: 15 g/dL (ref 13.2–17.1)
Lymphs Abs: 1316 cells/uL (ref 850–3900)
MCH: 28.5 pg (ref 27.0–33.0)
MCHC: 33.9 g/dL (ref 32.0–36.0)
MCV: 84 fL (ref 80.0–100.0)
MPV: 11.2 fL (ref 7.5–12.5)
Monocytes Relative: 14.6 %
Neutro Abs: 2064 cells/uL (ref 1500–7800)
Neutrophils Relative %: 48 %
Platelets: 209 10*3/uL (ref 140–400)
RBC: 5.26 10*6/uL (ref 4.20–5.80)
RDW: 14.5 % (ref 11.0–15.0)
Total Lymphocyte: 30.6 %
WBC: 4.3 10*3/uL (ref 3.8–10.8)

## 2022-10-13 LAB — COMPLETE METABOLIC PANEL WITH GFR
AG Ratio: 1 (calc) (ref 1.0–2.5)
ALT: 29 U/L (ref 9–46)
AST: 27 U/L (ref 10–40)
Albumin: 4.1 g/dL (ref 3.6–5.1)
Alkaline phosphatase (APISO): 61 U/L (ref 36–130)
BUN: 15 mg/dL (ref 7–25)
CO2: 27 mmol/L (ref 20–32)
Calcium: 9.5 mg/dL (ref 8.6–10.3)
Chloride: 103 mmol/L (ref 98–110)
Creat: 1.07 mg/dL (ref 0.60–1.26)
Globulin: 4.1 g/dL (calc) — ABNORMAL HIGH (ref 1.9–3.7)
Glucose, Bld: 78 mg/dL (ref 65–99)
Potassium: 4.4 mmol/L (ref 3.5–5.3)
Sodium: 137 mmol/L (ref 135–146)
Total Bilirubin: 0.5 mg/dL (ref 0.2–1.2)
Total Protein: 8.2 g/dL — ABNORMAL HIGH (ref 6.1–8.1)
eGFR: 92 mL/min/{1.73_m2} (ref >=60)

## 2022-10-13 LAB — SEDIMENTATION RATE: Sed Rate: 25 mm/h — ABNORMAL HIGH (ref 0–15)

## 2022-10-13 NOTE — Progress Notes (Signed)
Lab results are partially improved.  Sedimentation rate down to 25 from 29 and 31 before that still outside normal range but getting better.  Blood count and kidney and liver function are all normal so no problems for continuing the methotrexate.

## 2022-12-24 ENCOUNTER — Ambulatory Visit
Admission: RE | Admit: 2022-12-24 | Discharge: 2022-12-24 | Disposition: A | Discharge status: Home / Self Care | Payer: Medicaid Other | Source: Ambulatory Visit | Attending: Internal Medicine | Admitting: Internal Medicine

## 2022-12-24 ENCOUNTER — Other Ambulatory Visit: Payer: Self-pay | Admitting: Internal Medicine

## 2022-12-24 DIAGNOSIS — M5020 Other cervical disc displacement, unspecified cervical region: Secondary | ICD-10-CM

## 2023-01-16 ENCOUNTER — Ambulatory Visit: Payer: Medicaid Other | Attending: Internal Medicine | Admitting: Internal Medicine

## 2023-01-16 ENCOUNTER — Encounter: Payer: Self-pay | Admitting: Internal Medicine

## 2023-01-16 VITALS — BP 104/71 | HR 80 | Resp 12 | Ht 70.0 in | Wt 186.0 lb

## 2023-01-16 DIAGNOSIS — M75111 Incomplete rotator cuff tear or rupture of right shoulder, not specified as traumatic: Secondary | ICD-10-CM

## 2023-01-16 DIAGNOSIS — M0600 Rheumatoid arthritis without rheumatoid factor, unspecified site: Secondary | ICD-10-CM

## 2023-01-16 DIAGNOSIS — Z79899 Other long term (current) drug therapy: Secondary | ICD-10-CM | POA: Diagnosis not present

## 2023-01-16 LAB — CBC WITH DIFFERENTIAL/PLATELET
Basophils Relative: 0.6 %
MPV: 11.4 fL (ref 7.5–12.5)
Total Lymphocyte: 25.6 %

## 2023-01-16 MED ORDER — METHOTREXATE SODIUM CHEMO INJECTION (PF) 50 MG/2ML
INTRAMUSCULAR | 2 refills | Status: DC
Start: 2023-01-16 — End: 2023-01-17

## 2023-01-16 NOTE — Progress Notes (Signed)
Office Visit Note  Patient: Steve Flores             Date of Birth: [birth year]           MRN: [id number]             PCP: Collene Mares, PA Referring: Collene Mares, [place] Visit Date: 01/16/2023   Subjective:  Follow-up   History of Present Illness: Steve Flores is a 37 y.o. male here for follow up for seronegative RA on methotrexate on 20 mg [place] weekly and folic acid 1 mg daily. Symptoms are doing pretty well overall. Still has pain and stiffness more noticeable after increased use. Without visible swelling. Having less issue with fatigue after methotrexate doses still present but returns to baseline in between.  Previous HPI 10/12/22 Steve ZOMBRO is a 37 y.o. male here for follow up for seronegative RA on methotrexate 20 mg subcu weekly folic acid 1 mg daily.  Overall symptoms are partially improved compared to last visit.  Still having some intolerance to methotrexate but not as bad.  Had steroid injection in the right shoulder with symptom benefit with Christena Deem about 2 weeks ago.  Still taking meloxicam last refill in January so not on every day.  He is very fatigued today associated with less sleep pain for midterms studying in [employer]inity school with [employer].  He has been noticing some more clicking or popping sensation affecting his knees while climbing stairs.   Previous HPI 07/06/22 Steve Flores is a 37 y.o. male here for follow up for seronegative RA on MTX 15 mg [place] weekly and folic acid 1 mg daily and probable bilateral carpal tunnel syndrome.  Does feel a benefit in his symptoms the swelling is much better though he still has joint pain and stiffness pretty frequently.  He has noticed a pattern of more symptom improvement about every other week after his methotrexate dose but not as much on the other half of doses.  He notices pain in both shoulders.  Feels decreased grip strength in his hands worse on the right side, often have more pain after activities like  basketball.  He feels some weakness in his knees and but not really painful and does not see any swelling.   Previous HPI 04/27/22 Steve Flores is a 37 y.o. male here for follow up for seronegative RA on MTX 15 mg [place] weekly and folic acid 1 mg daily. He delayed starting the medication until start of this week due to concern about side effects. He was also discussing adjustment to antidepressant medication and concern about significant fatigue. He did notice this worse but not sure if related to the medication. No GI side effects or injection site rash. On review of the medication had concern about hair loss or fertility side effects.   Previous HPI 03/28/2022 Steve Flores is a 37 y.o. male here for evaluation and management of inflammatory arthritis, previously seen at [facility] rheumatology with inflammatory changes in wrists, MCPs, and PIPs. He has not been consistently established with medical insurance limiting original diagnosis and treatment.  He developed the joint inflammation symptoms starting since around 2017.  He saw [facility] rheumatology work-up at that time with positive RNP and SSA antibodies with proximal hand swelling and was recommended to start treatment.  He never actually got onto the medicine which appears to have been subcutaneous methotrexate due to insurance issues.  Since that time he has had a lot  of persistent joint pains most severe in his hands and wrist but also with pain affecting his hips and feet.  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  He is also become disabled from work due to physical limitations especially with his hands.  In addition to the pain swelling and stiffness he also describes tingling and numbness extending into the hands.  He cannot perform push-ups or other activities requiring wrist extension.  He is  also unable to tightly grip items with either hand.   Labs reviewed 09/2020 ANA 1:1280 speckled RNP >8.0 SSA 6.6 dsDNA, SM, SSB neg RF neg CCP neg HLA-B27 neg HAV/HBV/HCV neg   Review of Systems  Constitutional:  Positive for fatigue.  HENT:  Negative for mouth sores and mouth dryness.   Eyes:  Negative for dryness.  Respiratory:  Negative for shortness of breath.   Cardiovascular:  Positive for chest pain. Negative for palpitations.  Gastrointestinal:  Negative for blood in stool, constipation and diarrhea.  Endocrine: Negative for increased urination.  Genitourinary:  Negative for involuntary urination.  Musculoskeletal:  Positive for joint pain, joint pain, joint swelling, myalgias, muscle weakness, morning stiffness, muscle tenderness and myalgias. Negative for gait problem.  Skin:  Positive for rash. Negative for color change, hair loss and sensitivity to sunlight.  Allergic/Immunologic: Negative for susceptible to infections.  Neurological:  Positive for dizziness and headaches.  Hematological:  Negative for swollen glands.  Psychiatric/Behavioral:  Positive for depressed mood and sleep disturbance. The patient is nervous/anxious.     PMFS History:  Patient Active Problem List   Diagnosis Date Noted   Unspecified rotator cuff tear or rupture of right shoulder, not specified as traumatic 10/12/2022   Patellofemoral crepitus 10/12/2022   Seronegative rheumatoid arthritis (HCC) 03/28/2022   High risk medication use 03/28/2022   Educated about COVID-19 virus infection 08/06/2019   Precordial chest pain 06/20/2019   Nonspecific abnormal electrocardiogram (ECG) (EKG) 06/20/2019   Bilateral carpal tunnel syndrome 01/10/2018   Laryngopharyngeal reflux (LPR) 11/04/2017   Persistent hoarseness 11/04/2017    Past Medical History:  Diagnosis Date   Abrasion of finger of right hand 12/28/2011   healing wound right index finger   Disorder of bursae of right shoulder region     Fracture of phalanx of finger 12/26/2011   right ring prox. phalanx   GERD (gastroesophageal reflux disease)    Inguinal hernia    Pinched nerve in neck    Rheumatoid aortitis     Family History  Problem Relation Age of Onset   Rheum arthritis Mother    Sickle cell anemia Father    Asthma Sister    Healthy Son    Past Surgical History:  Procedure Laterality Date   INGUINAL HERNIA REPAIR Left 10/07/2015   Procedure: LAPAROSCOPIC LEFT  INGUINAL HERNIA;  Surgeon: Axel Filler, MD;  Location: [facility];  Service: General;  Laterality: Left;   INSERTION OF MESH Left 10/07/2015   Procedure: INSERTION OF MESH;  Surgeon: Axel Filler, MD;  Location: [facility];  Service: General;  Laterality: Left;   Social History   Social History Narrative   Designer, 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     Immunization History  Administered Date(s) Administered   Tdap 10/10/2019     Objective: Vital Signs: BP 104/71 (BP Location: Left Arm, Patient Position: Sitting, Cuff Size: Normal)  Pulse 80   Resp 12   Ht 5\' 10"  (1.778 m)   Wt 186 lb (84.4 kg)   BMI 26.69 kg/m    Physical Exam Cardiovascular:     Rate and Rhythm: Normal rate and regular rhythm.  Pulmonary:     Effort: Pulmonary effort is normal.     Breath sounds: Normal breath sounds.  Musculoskeletal:     Right lower leg: No edema.     Left lower leg: No edema.  Skin:    General: Skin is warm and dry.     Findings: No rash.  Neurological:     Mental Status: He is alert.  Psychiatric:        Mood and Affect: Mood normal.      Musculoskeletal Exam:  Elbows full ROM no tenderness or swelling Right wrist tenderness to pressure and with movement, no palpable swelling, left wrist normal Right 2nd-4th PIPs tenderness to pressure, some pain with full flexion but ROM intact, no swelling Knees full ROM, patellofemoral crepitus present, no palpable effusions Ankles full ROM no tenderness or swelling  CDAI Exam: CDAI Score:  -- Patient Global: --; Provider Global: -- Swollen: 0 ; Tender: 4  Joint Exam 01/16/2023      Right  Left  Wrist   Tender     PIP 2   Tender     PIP 3   Tender     PIP 4   Tender         Investigation: No additional findings.  Imaging: No results found.  Recent Labs: Lab Results  Component Value Date   WBC 3.5 (L) 01/16/2023   HGB 14.9 01/16/2023   PLT 213 01/16/2023   NA 139 01/16/2023   K 4.3 01/16/2023   CL 103 01/16/2023   CO2 28 01/16/2023   GLUCOSE 91 01/16/2023   BUN 13 01/16/2023   CREATININE 1.09 01/16/2023   BILITOT 0.3 01/16/2023   ALKPHOS 56 01/25/2022   AST 22 01/16/2023   ALT 22 01/16/2023   PROT 7.8 01/16/2023   ALBUMIN 4.2 01/25/2022   CALCIUM 9.6 01/16/2023   GFRAA >60 10/29/2019    Speciality Comments: No specialty comments available.  Procedures:  No procedures performed Allergies: Patient has no known allergies.   Assessment / Plan:     Visit Diagnoses: Seronegative rheumatoid arthritis (HCC) - Plan: Sedimentation rate, DISCONTINUED: Methotrexate Sodium (METHOTREXATE, PF,) 50 MG/2ML injection  Joint pain in several areas but no peripheral joint synovitis on exam today.  Will recheck sedimentation rate for disease activity monitoring.  Plan is to continue methotrexate 20 mg subcu weekly and folic acid 1 mg daily.  Agree with continued meloxicam or intermittent OTC NSAIDs as needed for mild breakthrough symptoms.  High risk medication use - Plan: CBC with Differential/Platelet, COMPLETE METABOLIC PANEL WITH GFR  Checking CBC and CMP for medication monitoring on continued use of methotrexate.  No serious interval infections.  Still having significant but transient fatigue after medication doses but tolerating a bit better since before.  Definitely much better since switching to injectable form.  Incomplete tear of right rotator cuff, unspecified whether traumatic  Right shoulder pain appearing somewhat better still present but not as bad or  easily provoked. Had the steroid injection before last visit no additional interventions.  Orders: Orders Placed This Encounter  Procedures   Sedimentation rate   CBC with Differential/Platelet   COMPLETE METABOLIC PANEL WITH GFR   Meds ordered this encounter  Medications   DISCONTD: Methotrexate  Sodium (METHOTREXATE, PF,) 50 MG/2ML injection    Sig: Inject 0.8 mL into the skin once weekly.    Dispense:  4 mL    Refill:  2     Follow-Up Instructions: Return in about 3 months (around 04/18/2023) for RA on MTX f/u 3mos.   Fuller Plan, MD  Note - This record has been created using [employer].  Chart creation errors have been sought, but may not always  have been located. Such creation errors do not reflect on  the standard of medical care.

## 2023-01-17 ENCOUNTER — Telehealth: Payer: Self-pay

## 2023-01-17 DIAGNOSIS — M0600 Rheumatoid arthritis without rheumatoid factor, unspecified site: Secondary | ICD-10-CM

## 2023-01-17 LAB — CBC WITH DIFFERENTIAL/PLATELET
Absolute Monocytes: 588 cells/uL (ref 200–950)
Basophils Absolute: 21 cells/uL (ref 0–200)
Eosinophils Absolute: 210 cells/uL (ref 15–500)
Eosinophils Relative: 6 %
HCT: 44 % (ref 38.5–50.0)
Hemoglobin: 14.9 g/dL (ref 13.2–17.1)
Lymphs Abs: 896 cells/uL (ref 850–3900)
MCH: 29.3 pg (ref 27.0–33.0)
MCHC: 33.9 g/dL (ref 32.0–36.0)
MCV: 86.4 fL (ref 80.0–100.0)
Monocytes Relative: 16.8 %
Neutro Abs: 1785 cells/uL (ref 1500–7800)
Neutrophils Relative %: 51 %
Platelets: 213 10*3/uL (ref 140–400)
RBC: 5.09 10*6/uL (ref 4.20–5.80)
RDW: 15.5 % — ABNORMAL HIGH (ref 11.0–15.0)
WBC: 3.5 10*3/uL — ABNORMAL LOW (ref 3.8–10.8)

## 2023-01-17 LAB — COMPLETE METABOLIC PANEL WITH GFR
AG Ratio: 1.1 (calc) (ref 1.0–2.5)
ALT: 22 U/L (ref 9–46)
AST: 22 U/L (ref 10–40)
Albumin: 4.1 g/dL (ref 3.6–5.1)
Alkaline phosphatase (APISO): 61 U/L (ref 36–130)
BUN: 13 mg/dL (ref 7–25)
CO2: 28 mmol/L (ref 20–32)
Calcium: 9.6 mg/dL (ref 8.6–10.3)
Chloride: 103 mmol/L (ref 98–110)
Creat: 1.09 mg/dL (ref 0.60–1.26)
Globulin: 3.7 g/dL (calc) (ref 1.9–3.7)
Glucose, Bld: 91 mg/dL (ref 65–99)
Potassium: 4.3 mmol/L (ref 3.5–5.3)
Sodium: 139 mmol/L (ref 135–146)
Total Bilirubin: 0.3 mg/dL (ref 0.2–1.2)
Total Protein: 7.8 g/dL (ref 6.1–8.1)
eGFR: 90 mL/min/{1.73_m2} (ref >=60)

## 2023-01-17 LAB — SEDIMENTATION RATE: Sed Rate: 17 mm/h — ABNORMAL HIGH (ref 0–15)

## 2023-01-17 MED ORDER — METHOTREXATE SODIUM CHEMO INJECTION (PF) 50 MG/2ML
INTRAMUSCULAR | 2 refills | Status: DC
Start: 2023-01-17 — End: 2024-02-25

## 2023-01-17 NOTE — Telephone Encounter (Signed)
Steve Flores from [facility] pharmacy called in regards to quantity for methotrexate as dispensing 4mL would be a 2 week supply since they are single use vials (PF is the only methotrexate they are able to get at this time at the pharmacy due to supply issues).   A verbal rx was provided to reflect dose: methotrexate, PF 50mg /17mL Inject 0.8 mL into the skin once weekly. Dispense 8mL with 2 refills.  A no print rx was updated to reflect dispense quantity change.

## 2023-01-26 ENCOUNTER — Encounter (HOSPITAL_BASED_OUTPATIENT_CLINIC_OR_DEPARTMENT_OTHER): Payer: Self-pay

## 2023-01-26 ENCOUNTER — Emergency Department (HOSPITAL_BASED_OUTPATIENT_CLINIC_OR_DEPARTMENT_OTHER)
Admission: EM | Admit: 2023-01-26 | Discharge: 2023-01-26 | Disposition: A | Discharge status: Home / Self Care | Payer: Medicaid Other | Attending: Emergency Medicine | Admitting: Emergency Medicine

## 2023-01-26 DIAGNOSIS — M5412 Radiculopathy, cervical region: Secondary | ICD-10-CM

## 2023-01-26 DIAGNOSIS — M542 Cervicalgia: Secondary | ICD-10-CM | POA: Diagnosis present

## 2023-01-26 MED ORDER — HYDROMORPHONE HCL 1 MG/ML IJ SOLN
1.0000 mg | Freq: Once | INTRAMUSCULAR | Status: AC
  Administered 2023-01-26: 1 mg via INTRAMUSCULAR
  Filled 2023-01-26: qty 1

## 2023-01-26 MED ORDER — METHYLPREDNISOLONE 4 MG PO TBPK: ORAL_TABLET | ORAL | 0 refills | Status: DC

## 2023-01-26 MED ORDER — OXYCODONE-ACETAMINOPHEN 5-325 MG PO TABS
1.0000 | ORAL_TABLET | ORAL | Status: DC | PRN
  Administered 2023-01-26: 1 via ORAL
  Filled 2023-01-26: qty 1

## 2023-01-26 MED ORDER — GABAPENTIN 100 MG PO CAPS: 100.0000 mg | ORAL_CAPSULE | Freq: Three times a day (TID) | ORAL | 0 refills | Status: AC

## 2023-01-26 MED ORDER — KETOROLAC TROMETHAMINE 60 MG/2ML IM SOLN
60.0000 mg | Freq: Once | INTRAMUSCULAR | Status: AC
  Administered 2023-01-26: 60 mg via INTRAMUSCULAR
  Filled 2023-01-26: qty 2

## 2023-01-26 MED ORDER — OXYCODONE HCL 5 MG PO TABS: 5.0000 mg | ORAL_TABLET | ORAL | 0 refills | Status: DC | PRN

## 2023-01-26 MED ORDER — METHOCARBAMOL 500 MG PO TABS: 1000.0000 mg | ORAL_TABLET | Freq: Four times a day (QID) | ORAL | 0 refills | Status: AC | PRN

## 2023-01-26 NOTE — ED Provider Notes (Signed)
[place] EMERGENCY DEPARTMENT AT [facility] Provider Note   CSN: [id number] Arrival date & time: 01/26/23  9562     History {Add pertinent medical, surgical, social history, OB history to HPI:1} Chief Complaint  Patient presents with   Neck Pain    Steve Flores is a 37 y.o. male.  HPI     37 year old male with a history of seronegative rheumatoid arthritis on methotrexate    Reviewed previous records showing a CT cervical spine done in 2021 which showed no on Co. vertebral spurring with right-sided foraminal stenosis at C4-C5 and borderline right-sided foraminal narrowing at C5-C6.  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 phalanx   GERD (gastroesophageal reflux disease)    Inguinal hernia    Pinched nerve in neck    Rheumatoid aortitis      Home Medications Prior to Admission medications   Medication Sig Start Date End Date Taking? Authorizing Provider  acetaminophen (TYLENOL) 325 MG tablet Take 650 mg by mouth every 6 (six) hours as needed for mild pain.    [provider]  anastrozole (ARIMIDEX) 1 MG tablet Take by mouth every other day. 03/14/22   [provider]  busPIRone (BUSPAR) 5 MG tablet Take 5 mg by mouth daily.    [provider]  folic acid (FOLVITE) 1 MG tablet Take 1 tablet (1 mg total) by mouth daily. 10/12/22   Fuller Plan, MD  gabapentin (NEURONTIN) 100 MG capsule 1 capsule Orally twice a day for 30 days 12/18/22   [provider]  hydrOXYzine (ATARAX/VISTARIL) 25 MG tablet Take 1 tablet (25 mg total) by mouth every 6 (six) hours. 10/29/19   Dahlia Byes A, NP  meloxicam (MOBIC) 15 MG tablet Take 15 mg by mouth  daily. 02/26/22   [provider]  Methotrexate Sodium (METHOTREXATE, PF,) 50 MG/2ML injection Inject 0.8 mL into the skin once weekly. 01/17/23   Fuller Plan, MD  pantoprazole (PROTONIX) 20 MG tablet Take 1 tablet (20 mg total) by mouth daily. Patient not taking: Reported on 10/12/2022 01/25/22   Margarita Grizzle, MD  predniSONE (STERAPRED UNI-PAK 21 TAB) 10 MG (21) TBPK tablet Take by mouth. 12/07/22   [provider]  TUBERCULIN SYR 1CC/26GX3/8" 26G X 3/8" 1 ML MISC For injection of subcutaneous methotrexate once weekly 10/12/22   Fuller Plan, MD  Vitamin D, Ergocalciferol, (DRISDOL) 1.25 MG (50000 UNIT) CAPS capsule 1 capsule Orally ONCE A WEEK for 60 days 12/20/20   [provider]  omeprazole (PRILOSEC) 20 MG capsule Take 1 capsule (20 mg total) by mouth 2 (two) times daily. Patient not taking: Reported on 07/21/2017 01/21/17 03/06/19  Rolland Porter, MD  sucralfate (CARAFATE) 1 g tablet Take 1 tablet (1 g total) by mouth 4 (four) times daily. Patient not taking: Reported on 07/21/2017 01/21/17 03/06/19  Rolland Porter, MD      Allergies    Patient has no known allergies.    Review of Systems   Review of Systems  Physical Exam Updated Vital Signs BP (!) 134/99 (BP Location: Right Arm)   Pulse 85   Temp 98.1 F (36.7 C) (Oral)  Resp 19   SpO2 100%  Physical Exam  ED Results / Procedures / Treatments   Labs (all labs ordered are listed, but only abnormal results are displayed) Labs Reviewed - No data to display  EKG None  Radiology No results found.  Procedures Procedures  {Document cardiac monitor, telemetry assessment procedure when appropriate:1}  Medications Ordered in ED Medications  oxyCODONE-acetaminophen (PERCOCET/ROXICET) 5-325 MG per tablet 1 tablet (1 tablet Oral Given 01/26/23 1028)    ED Course/ Medical Decision Making/ A&P   {   Click here for ABCD2, HEART and other calculatorsREFRESH Note before signing :1}                           Medical Decision Making Risk Prescription drug management.   ***  {Document critical care time when appropriate:1} {Document review of labs and clinical decision tools ie heart score, Chads2Vasc2 etc:1}  {Document your independent review of radiology images, and any outside records:1} {Document your discussion with family members, caretakers, and with consultants:1} {Document social determinants of health affecting pt's care:1} {Document your decision making why or why not admission, treatments were needed:1} Final Clinical Impression(s) / ED Diagnoses Final diagnoses:  None    Rx / DC Orders ED Discharge Orders     None

## 2023-01-26 NOTE — ED Triage Notes (Signed)
Pt c/o left sided neck pain. Pt believes it is a pinched nerve and was aggravated by stretching. Pt is seeing the spine specialist on Monday.

## 2023-04-05 ENCOUNTER — Other Ambulatory Visit: Payer: Self-pay | Admitting: Sports Medicine

## 2023-04-05 DIAGNOSIS — M75101 Unspecified rotator cuff tear or rupture of right shoulder, not specified as traumatic: Secondary | ICD-10-CM

## 2023-04-19 ENCOUNTER — Encounter: Payer: Self-pay | Admitting: Internal Medicine

## 2023-04-19 ENCOUNTER — Other Ambulatory Visit: Payer: Self-pay | Admitting: *Deleted

## 2023-04-19 ENCOUNTER — Ambulatory Visit: Payer: Medicaid Other | Attending: Internal Medicine | Admitting: Internal Medicine

## 2023-04-19 VITALS — BP 109/76 | HR 71 | Resp 14 | Ht 70.0 in | Wt 188.0 lb

## 2023-04-19 DIAGNOSIS — Z79899 Other long term (current) drug therapy: Secondary | ICD-10-CM | POA: Diagnosis not present

## 2023-04-19 DIAGNOSIS — M0600 Rheumatoid arthritis without rheumatoid factor, unspecified site: Secondary | ICD-10-CM

## 2023-04-19 DIAGNOSIS — M75111 Incomplete rotator cuff tear or rupture of right shoulder, not specified as traumatic: Secondary | ICD-10-CM

## 2023-04-19 NOTE — Patient Instructions (Signed)
Leflunomide Tablets What is this medication? LEFLUNOMIDE (le FLOO na mide) treats the symptoms of rheumatoid arthritis. It works by slowing down an overactive immune system. This decreases inflammation. It belongs to a group of medications called DMARDs. This medicine may be used for other purposes; ask your health care provider or pharmacist if you have questions. COMMON BRAND NAME(S): Arava What should I tell my care team before I take this medication? They need to know if you have any of these conditions: Cancer Diabetes High blood pressure Immune system problems Infection Kidney disease Liver disease Low blood cell levels (white cells, red cells, and platelets) Lung or breathing disease, such as asthma or COPD Recent or upcoming vaccine Skin conditions Tingling of the fingers or toes, or other nerve disorder An unusual or allergic reaction to leflunomide, other medications, food, dyes, or preservatives Pregnant or trying to get pregnant Breastfeeding How should I use this medication? Take this medication by mouth with a full glass of water. Take it as directed on the prescription label at the same time every day. Keep taking it unless your care team tells you to stop. Talk to your care team about the use of this medication in children. Special care may be needed. Overdosage: If you think you have taken too much of this medicine contact a poison control center or emergency room at once. NOTE: This medicine is only for you. Do not share this medicine with others. What if I miss a dose? If you miss a dose, take it as soon as you can. If it is almost time for your next dose, take only that dose. Do not take double or extra doses. What may interact with this medication? Do not take this medication with any of the following: Teriflunomide This medication may also interact with the following: Alosetron Caffeine Cefaclor Certain medications for diabetes, such as nateglinide,  repaglinide, rosiglitazone, pioglitazone Certain medications for high cholesterol, such as atorvastatin, pravastatin, rosuvastatin, simvastatin Charcoal Cholestyramine Ciprofloxacin Duloxetine Estrogen and progestin hormones Furosemide Ketoprofen Live virus vaccines Medications that increase your risk for infection Methotrexate Mitoxantrone Paclitaxel Penicillin Theophylline Tizanidine Warfarin This list may not describe all possible interactions. Give your health care provider a list of all the medicines, herbs, non-prescription drugs, or dietary supplements you use. Also tell them if you smoke, drink alcohol, or use illegal drugs. Some items may interact with your medicine. What should I watch for while using this medication? Visit your care team for regular checks on your progress. Tell your care team if your symptoms do not start to get better or if they get worse. You may need blood work done while you are taking this medication. This medication may cause serious skin reactions. They can happen weeks to months after starting the medication. Contact your care team right away if you notice fevers or flu-like symptoms with a rash. The rash may be red or purple and then turn into blisters or peeling of the skin. You may also notice a red rash with swelling of the face, lips, or lymph nodes in your neck or under your arms. You should not receive certain vaccines during your treatment and for a certain time after your treatment with this medication ends. Talk to your care team for more information. This medication may stay in your body for up to 2 years after your last dose. Tell your care team about any unusual side effects or symptoms. A medication can be given to help lower your blood levels of this  medication more quickly. Talk to your care team if you may be pregnant. This medication can cause serious birth defects if taken during pregnancy and for a while after the last dose. You will  need a negative pregnancy test before starting this medication. Contraception is recommended while taking this medication and for a while after the last dose. Your care team can help you find the option that works for you. Do not breastfeed while taking this medication. What side effects may I notice from receiving this medication? Side effects that you should report to your care team as soon as possible: Allergic reactions--skin rash, itching, hives, swelling of the face, lips, tongue, or throat Dry cough, shortness of breath or trouble breathing Increase in blood pressure Infection--fever, chills, cough, sore throat, wounds that don't heal, pain or trouble when passing urine, general feeling of discomfort or being unwell Redness, blistering, peeling, or loosening of the skin, including inside the mouth Liver injury--right upper belly pain, loss of appetite, nausea, light-colored stool, dark yellow or brown urine, yellowing skin or eyes, unusual weakness or fatigue Pain, tingling, or numbness in the hands or feet Unusual bruising or bleeding Side effects that usually do not require medical attention (report to your care team if they continue or are bothersome): Back pain Diarrhea Hair loss Headache Nausea This list may not describe all possible side effects. Call your doctor for medical advice about side effects. You may report side effects to FDA at 1-800-FDA-1088. Where should I keep my medication? Keep out of the reach of children and pets. Store at room temperature between 20 and 25 degrees C (68 and 77 degrees F). Protect from moisture and light. Keep the container tightly closed. Get rid of any unused medication after the expiration date. To get rid of medications that are no longer needed or have expired: Take the medication to a medication take-back program. Check with your pharmacy or law enforcement to find a location. If you cannot return the medication, ask your pharmacist or  care team how to get rid of this medication safely. NOTE: This sheet is a summary. It may not cover all possible information. If you have questions about this medicine, talk to your doctor, pharmacist, or health care provider.  2024 Elsevier/Gold Standard (2021-12-07 00:00:00)

## 2023-04-19 NOTE — Telephone Encounter (Signed)
New start Arava after normal labs.

## 2023-04-20 LAB — COMPLETE METABOLIC PANEL WITH GFR
AG Ratio: 1.1 (calc) (ref 1.0–2.5)
ALT: 18 U/L (ref 9–46)
AST: 23 U/L (ref 10–40)
Albumin: 4.1 g/dL (ref 3.6–5.1)
Alkaline phosphatase (APISO): 60 U/L (ref 36–130)
BUN: 10 mg/dL (ref 7–25)
CO2: 27 mmol/L (ref 20–32)
Calcium: 9.6 mg/dL (ref 8.6–10.3)
Chloride: 103 mmol/L (ref 98–110)
Creat: 1.12 mg/dL (ref 0.60–1.26)
Globulin: 3.9 g/dL — ABNORMAL HIGH (ref 1.9–3.7)
Glucose, Bld: 81 mg/dL (ref 65–99)
Potassium: 4.1 mmol/L (ref 3.5–5.3)
Sodium: 139 mmol/L (ref 135–146)
Total Bilirubin: 0.5 mg/dL (ref 0.2–1.2)
Total Protein: 8 g/dL (ref 6.1–8.1)
eGFR: 87 mL/min/{1.73_m2} (ref >=60)

## 2023-04-20 LAB — CBC WITH DIFFERENTIAL/PLATELET
Absolute Monocytes: 584 {cells}/uL (ref 200–950)
Basophils Absolute: 29 {cells}/uL (ref 0–200)
Basophils Relative: 0.7 %
Eosinophils Absolute: 395 {cells}/uL (ref 15–500)
Eosinophils Relative: 9.4 %
HCT: 47.8 % (ref 38.5–50.0)
Hemoglobin: 15.7 g/dL (ref 13.2–17.1)
Lymphs Abs: 1432 {cells}/uL (ref 850–3900)
MCH: 28.9 pg (ref 27.0–33.0)
MCHC: 32.8 g/dL (ref 32.0–36.0)
MCV: 88 fL (ref 80.0–100.0)
MPV: 11.1 fL (ref 7.5–12.5)
Monocytes Relative: 13.9 %
Neutro Abs: 1760 {cells}/uL (ref 1500–7800)
Neutrophils Relative %: 41.9 %
Platelets: 224 10*3/uL (ref 140–400)
RBC: 5.43 10*6/uL (ref 4.20–5.80)
RDW: 14.2 % (ref 11.0–15.0)
Total Lymphocyte: 34.1 %
WBC: 4.2 10*3/uL (ref 3.8–10.8)

## 2023-04-20 LAB — SEDIMENTATION RATE: Sed Rate: 11 mm/h (ref 0–15)

## 2023-04-22 NOTE — Addendum Note (Signed)
Addended by: Henriette Combs on: 04/22/2023 10:56 AM   Modules accepted: Orders

## 2023-04-22 NOTE — Telephone Encounter (Signed)
Per office note on 04/19/2023: Discussed plan to switch going to leflunomide 20 mg p.o. once daily if labs okay today.

## 2023-04-23 ENCOUNTER — Inpatient Hospital Stay: Admission: RE | Admit: 2023-04-23 | Payer: Medicaid Other | Source: Ambulatory Visit

## 2023-04-23 MED ORDER — LEFLUNOMIDE 20 MG PO TABS: 20.0000 mg | ORAL_TABLET | Freq: Every day | ORAL | 2 refills | Status: DC

## 2023-04-23 NOTE — Progress Notes (Signed)
Sedimentation rate is 11 which is good.  Blood count kidney and liver function are all normal.  No problem for trying switch to leflunomide once daily instead of the methotrexate.

## 2023-04-25 ENCOUNTER — Other Ambulatory Visit: Payer: Self-pay | Admitting: Sports Medicine

## 2023-04-25 DIAGNOSIS — M75101 Unspecified rotator cuff tear or rupture of right shoulder, not specified as traumatic: Secondary | ICD-10-CM

## 2023-05-02 NOTE — H&P (Signed)
PREOPERATIVE H&P  Chief Complaint: right shoulder cartilage disorder,impingement, OA, rotator cuff tear,biceps tendonitis  HPI: Steve Flores is a 37 y.o. male who is scheduled for, Procedure(s): SHOULDER ARTHROSCOPY WITH SUBACROMIAL DECOMPRESSION, ROTATOR CUFF REPAIR AND BICEP TENDON REPAIR DISTAL CLAVICLE EXCISION.   Steve Flores is a 37 year old who is on Methotrexate with a history of rheumatoid arthritis. He works at [employer]. He he had acute on chronic type symptoms on the in the right shoulder. He has not been able raise his shoulder overhead. He had an ultrasound performed at some point which may have suggested he has a rotator cuff tear per patient report.  He is unhappy with his function. He is not able to do his physically demanding job.   Symptoms are rated as moderate to severe, and have been worsening.  This is significantly impairing activities of daily living.    Please see clinic note for further details on this patient's care.    He has elected for surgical management.   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 phalanx   GERD (gastroesophageal reflux disease)    Inguinal hernia    Pinched nerve in neck    Rheumatoid aortitis    Past Surgical History:  Procedure Laterality Date   INGUINAL HERNIA REPAIR Left 10/07/2015   Procedure: LAPAROSCOPIC LEFT  INGUINAL HERNIA;  Surgeon: Axel Filler, MD;  Location: [facility];  Service: General;  Laterality: Left;   INSERTION OF MESH Left 10/07/2015   Procedure: INSERTION OF MESH;  Surgeon: Axel Filler, MD;  Location: [facility];  Service: General;  Laterality: Left;   Social History   Socioeconomic History   Marital status: Single    Spouse name: Not on file   Number of children: Not on file   Years of education: Not on file   Highest  education level: Not on file  Occupational History   Not on file  Tobacco Use   Smoking status: Never    Passive exposure: Past   Smokeless tobacco: Never  Vaping Use   Vaping status: Never Used  Substance and Sexual Activity   Alcohol use: Not Currently    Comment: occasionally   Drug use: No   Sexual activity: Not on file  Other Topics Concern   Not on file  Social History Narrative   Designer, 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     Social Determinants of Health   Financial Resource Strain: Low Risk  (01/28/2023)   Received from [facility], [facility]   Overall Financial Resource Strain (CARDIA)    Difficulty of Paying Living Expenses: Not very hard  Food Insecurity: No Food Insecurity (01/28/2023)   Received from [facility], [facility]   Hunger Vital Sign    Worried About Running Out of Food in the Last Year: Never true    Ran Out of Food in the Last Year: Never true  Transportation Needs: No Transportation Needs (01/28/2023)   Received from [facility], [facility]   PRAPARE - Transportation    Lack of Transportation (Medical): No    Lack of Transportation (Non-Medical): No  Physical Activity: Not on file  Stress: Not on file  Social Connections: Unknown (01/25/2023)   Received [facility], [facility]  Social Network    Social Network: Not on file   Family History  Problem Relation Age of Onset   Rheum arthritis Mother    Sickle cell anemia Father    Asthma Sister    Healthy Son    No Known Allergies Prior to Admission medications   Medication Sig Start Date End Date Taking? Authorizing Provider  acetaminophen (TYLENOL) 325 MG tablet Take 650 mg by mouth every 6 (six) hours as needed for mild pain.    [provider]  anastrozole (ARIMIDEX) 1 MG tablet Take by mouth every other day. 03/14/22   [provider]  busPIRone (BUSPAR) 5 MG tablet Take 5 mg by mouth daily.    [provider]  folic acid (FOLVITE) 1  MG tablet Take 1 tablet (1 mg total) by mouth daily. 10/12/22   Rice, Jamesetta Orleans, MD  gabapentin (NEURONTIN) 100 MG capsule Take 1 capsule (100 mg total) by mouth 3 (three) times daily. 01/26/23 02/25/23  Alvira Monday, MD  hydrOXYzine (ATARAX/VISTARIL) 25 MG tablet Take 1 tablet (25 mg total) by mouth every 6 (six) hours. 10/29/19   Dahlia Byes A, NP  leflunomide (ARAVA) 20 MG tablet Take 1 tablet (20 mg total) by mouth daily. 04/23/23   Rice, Jamesetta Orleans, MD  meloxicam (MOBIC) 15 MG tablet Take 15 mg by mouth daily. 02/26/22   [provider]  methocarbamol (ROBAXIN) 500 MG tablet Take 2 tablets (1,000 mg total) by mouth every 6 (six) hours as needed for muscle spasms. 01/26/23   Alvira Monday, MD  Methotrexate Sodium (METHOTREXATE, PF,) 50 MG/2ML injection Inject 0.8 mL into the skin once weekly. 01/17/23   Rice, Jamesetta Orleans, MD  methylPREDNISolone (MEDROL DOSEPAK) 4 MG TBPK tablet As directed on pack Patient not taking: Reported on 04/19/2023 01/26/23   Alvira Monday, MD  oxyCODONE (ROXICODONE) 5 MG immediate release tablet Take 1 tablet (5 mg total) by mouth every 4 (four) hours as needed for severe pain. 01/26/23   Alvira Monday, MD  pantoprazole (PROTONIX) 20 MG tablet Take 1 tablet (20 mg total) by mouth daily. Patient not taking: Reported on 10/12/2022 01/25/22   Margarita Grizzle, MD  predniSONE (STERAPRED UNI-PAK 21 TAB) 10 MG (21) TBPK tablet Take by mouth. 12/07/22   [provider]  TUBERCULIN SYR 1CC/26GX3/8" 26G X 3/8" 1 ML MISC For injection of subcutaneous methotrexate once weekly 10/12/22   Fuller Plan, MD  Vitamin D, Ergocalciferol, (DRISDOL) 1.25 MG (50000 UNIT) CAPS capsule 1 capsule Orally ONCE A WEEK for 60 days 12/20/20   [provider]  omeprazole (PRILOSEC) 20 MG capsule Take 1 capsule (20 mg total) by mouth 2 (two) times daily. Patient not taking: Reported on 07/21/2017 01/21/17 03/06/19  Rolland Porter, MD  sucralfate (CARAFATE) 1 g tablet Take 1  tablet (1 g total) by mouth 4 (four) times daily. Patient not taking: Reported on 07/21/2017 01/21/17 03/06/19  Rolland Porter, MD    ROS: All other systems have been reviewed and were otherwise negative with the exception of those mentioned in the HPI and as above.  Physical Exam: General: Alert, no acute distress Cardiovascular: No pedal edema Respiratory: No cyanosis, no use of accessory musculature GI: No organomegaly, abdomen is soft and non-tender Skin: No lesions in the area of chief complaint Neurologic: Sensation intact distally Psychiatric: Patient is competent for consent with normal mood and affect Lymphatic: No axillary or cervical lymphadenopathy  MUSCULOSKELETAL:  The range of motion of the shoulder is to about  60 degrees actively, passive to 140. Cuff strength is grossly weak with supraspinatus and infraspinatus testing. Positive  O'Brien's. Positive impingement. Positive AC tenderness to palpation. No obvious instability.  Imaging:  X-rays in [place] demonstrate no obvious acute osseous abnormality around the shoulder.   Assessment: right shoulder cartilage disorder,impingement, OA, rotator cuff tear,biceps tendonitis  Plan: Plan for Procedure(s): SHOULDER ARTHROSCOPY WITH SUBACROMIAL DECOMPRESSION, ROTATOR CUFF REPAIR AND BICEP TENDON REPAIR DISTAL CLAVICLE EXCISION  The risks benefits and alternatives were discussed with the patient including but not limited to the risks of nonoperative treatment, versus surgical intervention including infection, bleeding, nerve injury,  blood clots, cardiopulmonary complications, morbidity, mortality, among others, and they were willing to proceed.   The patient acknowledged the explanation, agreed to proceed with the plan and consent was signed.   Operative Plan: Right shoulder scope with SAD, DCE, BT, possible RBR Discharge Medications: standard (avoid NSAIDs on methotrexate) DVT Prophylaxis: none Physical Therapy: outpatient  PT Special Discharge needs: Sling. IceMan   Vernetta Honey, PA-C  05/02/2023 3:18 PM

## 2023-05-15 ENCOUNTER — Ambulatory Visit
Admission: RE | Admit: 2023-05-15 | Discharge: 2023-05-15 | Disposition: A | Discharge status: Home / Self Care | Payer: Medicaid Other | Source: Ambulatory Visit | Attending: Sports Medicine | Admitting: Sports Medicine

## 2023-05-15 ENCOUNTER — Encounter (HOSPITAL_BASED_OUTPATIENT_CLINIC_OR_DEPARTMENT_OTHER): Payer: Self-pay | Admitting: Orthopaedic Surgery

## 2023-05-15 DIAGNOSIS — M75101 Unspecified rotator cuff tear or rupture of right shoulder, not specified as traumatic: Secondary | ICD-10-CM

## 2023-05-15 MED ORDER — IOPAMIDOL (ISOVUE-M 200) INJECTION 41%
15.0000 mL | Freq: Once | INTRAMUSCULAR | Status: AC
  Administered 2023-05-15: 15 mL via INTRA_ARTICULAR

## 2023-05-20 NOTE — Progress Notes (Signed)
Pt stated that he was told the surgery would be postponed due to MRI results indicating PT may be a better option. He is requesting to hold off on pre-op screening call until surgery is confirmed.

## 2023-05-21 NOTE — Progress Notes (Signed)
Left message with Sherri at Dr. Austin Miles office making her aware patient states his surgery scheduled for 05/23/23 is postponed. He is currently still posted on the schedule for [facility].

## 2023-05-23 ENCOUNTER — Ambulatory Visit (HOSPITAL_BASED_OUTPATIENT_CLINIC_OR_DEPARTMENT_OTHER): Admission: RE | Admit: 2023-05-23 | Payer: Medicaid Other | Source: Home / Self Care | Admitting: Orthopaedic Surgery

## 2023-05-23 ENCOUNTER — Encounter (HOSPITAL_BASED_OUTPATIENT_CLINIC_OR_DEPARTMENT_OTHER): Admission: RE | Payer: Self-pay | Source: Home / Self Care

## 2023-05-23 HISTORY — DX: Rheumatoid arthritis, unspecified: M06.9

## 2023-05-23 SURGERY — SHOULDER ARTHROSCOPY WITH SUBACROMIAL DECOMPRESSION, ROTATOR CUFF REPAIR AND BICEP TENDON REPAIR
Anesthesia: General | Laterality: Right

## 2023-06-20 ENCOUNTER — Emergency Department (HOSPITAL_BASED_OUTPATIENT_CLINIC_OR_DEPARTMENT_OTHER): Payer: Medicaid Other

## 2023-06-20 ENCOUNTER — Emergency Department (HOSPITAL_BASED_OUTPATIENT_CLINIC_OR_DEPARTMENT_OTHER)
Admission: EM | Admit: 2023-06-20 | Discharge: 2023-06-20 | Disposition: A | Discharge status: Home / Self Care | Payer: Medicaid Other | Attending: Emergency Medicine | Admitting: Emergency Medicine

## 2023-06-20 ENCOUNTER — Other Ambulatory Visit: Payer: Self-pay

## 2023-06-20 DIAGNOSIS — S8991XA Unspecified injury of right lower leg, initial encounter: Secondary | ICD-10-CM | POA: Insufficient documentation

## 2023-06-20 DIAGNOSIS — Y9361 Activity, american tackle football: Secondary | ICD-10-CM | POA: Diagnosis not present

## 2023-06-20 DIAGNOSIS — X501XXA Overexertion from prolonged static or awkward postures, initial encounter: Secondary | ICD-10-CM | POA: Diagnosis not present

## 2023-06-20 MED ORDER — OXYCODONE-ACETAMINOPHEN 5-325 MG PO TABS
2.0000 | ORAL_TABLET | Freq: Once | ORAL | Status: AC
  Administered 2023-06-20: 2 via ORAL
  Filled 2023-06-20: qty 2

## 2023-06-20 MED ORDER — OXYCODONE-ACETAMINOPHEN 5-325 MG PO TABS
1.0000 | ORAL_TABLET | Freq: Four times a day (QID) | ORAL | 0 refills | Status: DC | PRN
Start: 2023-06-20 — End: 2023-08-22

## 2023-06-20 NOTE — Discharge Instructions (Addendum)
You were seen in the ER today for knee pain. Your physical exam is concerning for a possible meniscal injury as well as ACL/LCL injury. Your xray did not show any fractures, but did show notching of the lateral femoral condyle which can be seen in ACL tears. I would recommend following up with orthopedics for further evaluation to ensure this can be evaluated further. A referral to [facility] was made for you but you may also reach out to your orthopedics groups you have worked with previously.  Percocet was sent to your pharmacy for severe pain. For mild to moderate pain, try to manage pain with Tylenol or ibuprofen. Return to the ER if symptoms are worsening.

## 2023-06-20 NOTE — ED Provider Notes (Signed)
[place] [facility] AT [facility] Provider Note   CSN: [id number] Arrival date & time: 06/20/23  1412     History Chief Complaint  Patient presents with   Knee Pain    Steve Flores is a 37 y.o. male.  Patient presents to the emergency department concerns of right knee pain.  Reports that he was playing football when he planted his foot and went to change directions which time he felt a pop and sudden pain in the right knee.  Feels that majority pain is in the posterior aspect of the right knee.  Some mild swelling but denies any bruising.  Not on blood thinners.  No prior history of any significant injury to this area no prior history of surgery.  Denies any direct impact to this knee resulting in the injury and it appears that this was noncontact related.   Knee Pain      Home Medications Prior to Admission medications   Medication Sig Start Date End Date Taking? Authorizing Provider  oxyCODONE-acetaminophen (PERCOCET/ROXICET) 5-325 MG tablet Take 1 tablet by mouth every 6 (six) hours as needed for severe pain (pain score 7-10). 06/20/23  Yes Smitty Knudsen, PA-C  acetaminophen (TYLENOL) 325 MG tablet Take 650 mg by mouth every 6 (six) hours as needed for mild pain.    [provider]  anastrozole (ARIMIDEX) 1 MG tablet Take by mouth every other day. 03/14/22   [provider]  busPIRone (BUSPAR) 5 MG tablet Take 5 mg by mouth daily.    [provider]  folic acid (FOLVITE) 1 MG tablet Take 1 tablet (1 mg total) by mouth daily. 10/12/22   Rice, Jamesetta Orleans, MD  gabapentin (NEURONTIN) 100 MG capsule Take 1 capsule (100 mg total) by mouth 3 (three) times daily. 01/26/23 02/25/23  Alvira Monday, MD  hydrOXYzine (ATARAX/VISTARIL) 25 MG tablet Take 1 tablet (25 mg total) by mouth every 6 (six) hours. 10/29/19   Dahlia Byes A, NP  leflunomide (ARAVA) 20 MG tablet Take 1 tablet (20 mg total) by mouth daily. 04/23/23   Rice, Jamesetta Orleans, MD   meloxicam (MOBIC) 15 MG tablet Take 15 mg by mouth daily. 02/26/22   [provider]  methocarbamol (ROBAXIN) 500 MG tablet Take 2 tablets (1,000 mg total) by mouth every 6 (six) hours as needed for muscle spasms. 01/26/23   Alvira Monday, MD  Methotrexate Sodium (METHOTREXATE, PF,) 50 MG/2ML injection Inject 0.8 mL into the skin once weekly. 01/17/23   Rice, Jamesetta Orleans, MD  methylPREDNISolone (MEDROL DOSEPAK) 4 MG TBPK tablet As directed on pack Patient not taking: Reported on 04/19/2023 01/26/23   Alvira Monday, MD  oxyCODONE (ROXICODONE) 5 MG immediate release tablet Take 1 tablet (5 mg total) by mouth every 4 (four) hours as needed for severe pain. 01/26/23   Alvira Monday, MD  pantoprazole (PROTONIX) 20 MG tablet Take 1 tablet (20 mg total) by mouth daily. Patient not taking: Reported on 10/12/2022 01/25/22   Margarita Grizzle, MD  predniSONE (STERAPRED UNI-PAK 21 TAB) 10 MG (21) TBPK tablet Take by mouth. 12/07/22   [provider]  TUBERCULIN SYR 1CC/26GX3/8" 26G X 3/8" 1 ML MISC For injection of subcutaneous methotrexate once weekly 10/12/22   Fuller Plan, MD  Vitamin D, Ergocalciferol, (DRISDOL) 1.25 MG (50000 UNIT) CAPS capsule 1 capsule Orally ONCE A WEEK for 60 days 12/20/20   [provider]  omeprazole (PRILOSEC) 20 MG capsule Take 1 capsule (20 mg total) by mouth  2 (two) times daily. Patient not taking: Reported on 07/21/2017 01/21/17 03/06/19  Rolland Porter, MD  sucralfate (CARAFATE) 1 g tablet Take 1 tablet (1 g total) by mouth 4 (four) times daily. Patient not taking: Reported on 07/21/2017 01/21/17 03/06/19  Rolland Porter, MD      Allergies    Patient has no known allergies.    Review of Systems   Review of Systems  Musculoskeletal:        Knee pain  All other systems reviewed and are negative.   Physical Exam Updated Vital Signs There were no vitals taken for this visit. Physical Exam Vitals and nursing note reviewed.  Constitutional:       General: He is not in acute distress.    Appearance: He is well-developed.  HENT:     Head: Normocephalic and atraumatic.  Eyes:     Conjunctiva/sclera: Conjunctivae normal.  Cardiovascular:     Rate and Rhythm: Normal rate and regular rhythm.     Heart sounds: No murmur heard. Pulmonary:     Effort: Pulmonary effort is normal. No respiratory distress.     Breath sounds: Normal breath sounds.  Abdominal:     Palpations: Abdomen is soft.     Tenderness: There is no abdominal tenderness.  Musculoskeletal:        General: Swelling, tenderness and signs of injury present. No deformity.     Cervical back: Neck supple.       Legs:  Skin:    General: Skin is warm and dry.     Capillary Refill: Capillary refill takes less than 2 seconds.  Neurological:     Mental Status: He is alert.  Psychiatric:        Mood and Affect: Mood normal.     ED Results / Procedures / Treatments   Labs (all labs ordered are listed, but only abnormal results are displayed) Labs Reviewed - No data to display  EKG None  Radiology DG Knee Complete 4 Views Right  Result Date: 06/20/2023 CLINICAL DATA:  Popping injury of the right knee playing football. Swelling and pain. EXAM: RIGHT KNEE - COMPLETE 4+ VIEW COMPARISON:  None Available. FINDINGS: Small to moderate knee effusion. About 1.5 mm notching anteriorly along the lateral femoral condyle, which can sometimes be associated with ACL tear. I do not see a well-defined fracture. IMPRESSION: 1. Small to moderate knee effusion. 2. About 1.5 mm notching along the lateral femoral condyle, which can sometimes be associated with ACL tear. MRI of the knee may be helpful in further characterization. Electronically Signed   By: Gaylyn Rong M.D.   On: 06/20/2023 14:55    Procedures Procedures   Medications Ordered in ED Medications  oxyCODONE-acetaminophen (PERCOCET/ROXICET) 5-325 MG per tablet 2 tablet (2 tablets Oral Given 06/20/23 1517)    ED  Course/ Medical Decision Making/ A&P                               Medical Decision Making Risk Prescription drug management.   This patient presents to the ED for concern of right knee pain. Differential diagnosis includes meniscal tear, ACL tear, knee effusion   Imaging Studies ordered:  I ordered imaging studies including x-ray right knee I independently visualized and interpreted imaging which showed small to moderate right knee effusion, about 1.5 mm notching along the lateral femoral condyle, which can sometimes be associated with ACL tear. MRI of the knee may be  helpful in further characterization. I agree with the radiologist interpretation   Medicines ordered and prescription drug management:  I ordered medication including Percocet for pain Reevaluation of the patient after these medicines showed that the patient improved I have reviewed the patients home medicines and have made adjustments as needed   Problem List / ED Course:  Patient presents to the emergency department concerns of right knee pain.  Reports that he was playing football earlier today when he planted his leg down and tried to twist and he felt a pop in the knee with immediate pain.  Denies any direct impact to the knee or any other mechanism of injury.  No prior history of any orthopedic surgeries in this area.  Able to tolerate some weightbearing but uncomfortable.  Full extension with flexion of the knee is also impaired due to pain.  Took some ibuprofen prior to arriving but no significant limping. On my assessment, he was concern for possible lateral knee involvement as there is significant tenderness over this area and there is some mild joint effusion present.  Still concern for possible meniscal injury given knee testing.  X-ray imaging ordered for evaluation, without any acute fracture and suspect ligamental injury. X-rays negative for any acute fractures.  There is some the lateral femoral condyle  which may indicate possible ACL injury.  Advised patient he should follow-up with orthopedics for further evaluation.  Will send a prescription for Percocet for pain management and place patient in knee immobilizer and provided with crutches.  Strict return precautions discussed.  Patient otherwise agreeable with current plan and verbalized understanding return precautions.  Patient discharged home in stable condition.  Final Clinical Impression(s) / ED Diagnoses Final diagnoses:  Injury of right knee, initial encounter    Rx / DC Orders ED Discharge Orders          Ordered    AMB referral to orthopedics        06/20/23 1527    oxyCODONE-acetaminophen (PERCOCET/ROXICET) 5-325 MG tablet  Every 6 hours PRN        06/20/23 1527              Smitty Knudsen, PA-C 06/20/23 1545    Rondel Baton, MD 06/20/23 [phone]

## 2023-06-20 NOTE — ED Triage Notes (Signed)
Pt was playing football and felt and heard a pop in his right knee.  Visible swelling noted to right knee and c/o pain with movement and palpation to lateral and posterior right knee.

## 2023-06-20 NOTE — ED Notes (Signed)
Pt took 800mg  ibuprofen just prior to coming into ED today.

## 2023-06-24 ENCOUNTER — Ambulatory Visit (HOSPITAL_BASED_OUTPATIENT_CLINIC_OR_DEPARTMENT_OTHER): Payer: Medicaid Other | Admitting: Student

## 2023-06-26 ENCOUNTER — Ambulatory Visit (INDEPENDENT_AMBULATORY_CARE_PROVIDER_SITE_OTHER): Payer: Medicaid Other | Admitting: Orthopaedic Surgery

## 2023-06-26 ENCOUNTER — Encounter: Payer: Self-pay | Admitting: Orthopaedic Surgery

## 2023-06-26 DIAGNOSIS — M25561 Pain in right knee: Secondary | ICD-10-CM | POA: Diagnosis not present

## 2023-06-26 MED ORDER — LIDOCAINE HCL 1 % IJ SOLN
3.0000 mL | INTRAMUSCULAR | Status: AC | PRN
Start: 2023-06-26 — End: 2023-06-26
  Administered 2023-06-26: 3 mL

## 2023-06-26 MED ORDER — METHYLPREDNISOLONE ACETATE 40 MG/ML IJ SUSP
40.0000 mg | INTRAMUSCULAR | Status: AC | PRN
Start: 2023-06-26 — End: 2023-06-26
  Administered 2023-06-26: 40 mg via INTRA_ARTICULAR

## 2023-06-26 NOTE — Progress Notes (Signed)
The patient is a 37 year old gentleman that I am seeing for the first time.  He injured his right knee last week playing football.  He was seen at [facility] and they felt that he may have a torn ACL.  X-rays were negative but there was an effusion.  He still reports significant right knee swelling and pain as well as instability.  He did see his primary care physician yesterday.  He is someone who is active.  He does work for [employer].  He has no other significant active medical problems.  I was able to review his medications and past medical history within epic.  Examination of his right knee does show a moderate effusion.  He has a lot of guarding with that knee with flexion and extension.  He does have some ligamentous laxity of the right knee as well.  There is no laxity of the left knee.  X-rays of his right knee were reviewed from the time that he went to the ER and it shows only effusion.  The joint spaces well-maintained and the bones appear normal.  At this point I have recommended aspiration of his right knee and a MRI to evaluate the ligaments based on a large hemarthrosis combined with instability on exam.  I am concerned that he has a complete tear of his ACL.  He will continue wearing his knee brace and we will see him back in close follow-up once we have a MRI of his right knee to rule out ACL tear.  Of note he did have a very large hemarthrosis that I aspirated off of his knee.  There is likely a complete ACL tear.  He understands I want him to work on flexion and extension of that knee in the distant as he knows when he has his MRI being performed that we would need to see him back in the office a few days later.    Procedure Note  Patient: Steve Flores             Date of Birth: [birth year]           MRN: [id number]             Visit Date: 06/26/2023  Procedures: Visit Diagnoses:  1. Acute pain of right knee     Large Joint Inj: R knee on 06/26/2023 3:46  PM Indications: diagnostic evaluation and pain Details: 22 G 1.5 in needle, superolateral approach  Arthrogram: No  Medications: 3 mL lidocaine 1 %; 40 mg methylPREDNISolone acetate 40 MG/ML Outcome: tolerated well, no immediate complications Procedure, treatment alternatives, risks and benefits explained, specific risks discussed. Consent was given by the patient. Immediately prior to procedure a time out was called to verify the correct patient, procedure, equipment, support staff and site/side marked as required. Patient was prepped and draped in the usual sterile fashion.

## 2023-06-27 ENCOUNTER — Other Ambulatory Visit: Payer: Self-pay

## 2023-06-27 DIAGNOSIS — M25561 Pain in right knee: Secondary | ICD-10-CM

## 2023-07-05 ENCOUNTER — Encounter: Payer: Self-pay | Admitting: Orthopaedic Surgery

## 2023-07-09 ENCOUNTER — Other Ambulatory Visit: Payer: Medicaid Other

## 2023-07-19 NOTE — Progress Notes (Deleted)
 Office Visit Note  Patient: Steve Flores             Date of Birth: 1985/10/16           MRN: [id number]             PCP: Collene Mares, PA Referring: Collene Mares, [place] Visit Date: 08/02/2023   Subjective:  No chief complaint on file.   History of Present Illness: Steve Flores is a 37 y.o. male here for follow up for seronegative RA on methotrexate on 20 mg [place] weekly and folic acid 1 mg daily.    Previous HPI 04/19/2023 Steve Flores is a 37 y.o. 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  Since her last visit he has seen orthopedic surgery regarding partial-thickness right shoulder rotator cuff tear.  Symptoms have continued limiting him with severe pain raising his shoulder.  He had limited response to steroid injection and limited progress with strengthening rehab exercises.  Now scheduled for arthroscopic surgery upcoming in October.  He continues to have fair amount of side effects with the methotrexate with fatigue and headaches after taking every medication dose.  He was off the methotrexate for about a 1 month interruption did notice a difference in his arthritis.  But when he resumed the medication all of the side effects were more severe again like when he for started subsequently stopped it due to symptoms were so bad was causing him stress even anticipating them.   Previous HPI 01/16/2023 Steve Flores is a 37 y.o. 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 Symptoms are doing pretty well overall. Still has pain and stiffness more noticeable after increased use. Without visible swelling. Having less issue with fatigue after methotrexate doses still present but returns to baseline in between.   Previous HPI 10/12/22 Steve Flores is a 37 y.o. male here for follow up for seronegative RA on methotrexate 20 mg subcu weekly folic acid 1 mg  daily.  Overall symptoms are partially improved compared to last visit.  Still having some intolerance to methotrexate but not as bad.  Had steroid injection in the right shoulder with symptom benefit with Christena Deem about 2 weeks ago.  Still taking meloxicam last refill in January so not on every day.  He is very fatigued today associated with less sleep pain for midterms studying in [employer] with [employer].  He has been noticing some more clicking or popping sensation affecting his knees while climbing stairs.   Previous HPI 07/06/22 Steve Flores is a 37 y.o. male here for follow up for seronegative RA on MTX 15 mg [place] weekly and folic acid 1 mg daily and probable bilateral carpal tunnel syndrome.  Does feel a benefit in his symptoms the swelling is much better though he still has joint pain and stiffness pretty frequently.  He has noticed a pattern of more symptom improvement about every other week after his methotrexate dose but not as much on the other half of doses.  He notices pain in both shoulders.  Feels decreased grip strength in his hands worse on the right side, often have more pain after activities like basketball.  He feels some weakness in his knees and but not really painful and does not see any swelling.   Previous HPI 04/27/22  Steve Flores is a 37 y.o. male here for follow up for seronegative RA on MTX 15 mg [place] weekly and folic acid 1 mg daily. He delayed starting the medication until start of this week due to concern about side effects. He was also discussing adjustment to antidepressant medication and concern about significant fatigue. He did notice this worse but not sure if related to the medication. No GI side effects or injection site rash. On review of the medication had concern about hair loss or fertility side effects.   Previous HPI 03/28/2022 Steve Flores is a 37 y.o. male here for evaluation and management of inflammatory arthritis, previously seen at  [facility] rheumatology with inflammatory changes in wrists, MCPs, and PIPs. He has not been consistently established with medical insurance limiting original diagnosis and treatment.  He developed the joint inflammation symptoms starting since around 2017.  He saw [facility] rheumatology work-up at that time with positive RNP and SSA antibodies with proximal hand swelling and was recommended to start treatment.  He never actually got onto the medicine which appears to have been subcutaneous methotrexate due to insurance issues.  Since that time he has had a lot of persistent joint pains most severe in his hands and wrist but also with pain affecting his hips and feet.  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  He is also become disabled from work due to physical limitations especially with his hands.  In addition to the pain swelling and stiffness he also describes tingling and numbness extending into the hands.  He cannot perform push-ups or other activities requiring wrist extension.  He is also unable to tightly grip items with either hand.   Labs reviewed 09/2020 ANA 1:1280 speckled RNP >8.0 SSA 6.6 dsDNA, SM, SSB neg RF neg CCP neg HLA-B27 neg HAV/HBV/HCV neg   No Rheumatology ROS completed.   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 phalanx   GERD (gastroesophageal reflux disease)    Inguinal hernia    Pinched nerve in neck    Rheumatoid arthritis (HCC)     Family History  Problem Relation Age of Onset   Rheum arthritis Mother    Sickle cell anemia Father    Asthma Sister    Healthy Son    Past Surgical History:  Procedure Laterality Date   INGUINAL HERNIA REPAIR Left 10/07/2015   Procedure: LAPAROSCOPIC LEFT  INGUINAL HERNIA;  Surgeon: Axel Filler, MD;  Location: [facility];  Service: General;  Laterality: Left;   INSERTION OF MESH Left 10/07/2015   Procedure: INSERTION OF MESH;  Surgeon: Axel Filler, MD;  Location: [facility];  Service: General;  Laterality: Left;   Social History   Social History Narrative   Designer, 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     Immunization History  Administered Date(s) Administered   Tdap 10/10/2019     Objective: Vital Signs: There were no vitals taken for this visit.   Physical Exam   Musculoskeletal Exam: ***  CDAI Exam: CDAI Score: -- Patient Global: --; Provider Global: -- Swollen: --; Tender: -- Joint Exam 08/02/2023   No joint exam has been documented for this visit   There is currently no information documented on the homunculus. Go to the Rheumatology activity and complete the homunculus joint exam.  Investigation: No additional findings.  Imaging: DG Knee Complete 4 Views Right Result Date: 06/20/2023 CLINICAL DATA:  Popping injury of the right knee playing football. Swelling and pain. EXAM: RIGHT KNEE - COMPLETE 4+ VIEW COMPARISON:  None Available. FINDINGS: Small to moderate knee effusion. About 1.5 mm notching anteriorly along the lateral femoral condyle, which can sometimes be associated  with ACL tear. I do not see a well-defined fracture. IMPRESSION: 1. Small to moderate knee effusion. 2. About 1.5 mm notching along the lateral femoral condyle, which can sometimes be associated with ACL tear. MRI of the knee may be helpful in further characterization. Electronically Signed   By: Gaylyn Rong M.D.   On: 06/20/2023 14:55    Recent Labs: Lab Results  Component Value Date   WBC 4.2 04/19/2023   HGB 15.7 04/19/2023   PLT 224 04/19/2023   NA 139 04/19/2023   K 4.1 04/19/2023   CL 103 04/19/2023   CO2 27 04/19/2023   GLUCOSE 81 04/19/2023   BUN 10 04/19/2023   CREATININE 1.12 04/19/2023   BILITOT 0.5 04/19/2023   ALKPHOS 56 01/25/2022   AST 23 04/19/2023   ALT 18 04/19/2023   PROT 8.0 04/19/2023   ALBUMIN 4.2 01/25/2022   CALCIUM 9.6 04/19/2023   GFRAA >60 10/29/2019    Speciality Comments: No specialty comments available.  Procedures:  No procedures performed Allergies: Patient has no known allergies.   Assessment / Plan:     Visit Diagnoses: No diagnosis found.  ***  Orders: No orders of the defined types were placed in this encounter.  No orders of the defined types were placed in this encounter.    Follow-Up Instructions: No follow-ups on file.   Metta Clines, RT  Note - This record has been created using [employer].  Chart creation errors have been sought, but may not always  have been located. Such creation errors do not reflect on  the standard of medical care.

## 2023-08-02 ENCOUNTER — Ambulatory Visit: Payer: Medicaid Other | Admitting: Internal Medicine

## 2023-08-02 DIAGNOSIS — Z79899 Other long term (current) drug therapy: Secondary | ICD-10-CM

## 2023-08-02 DIAGNOSIS — M0600 Rheumatoid arthritis without rheumatoid factor, unspecified site: Secondary | ICD-10-CM

## 2023-08-02 DIAGNOSIS — M75111 Incomplete rotator cuff tear or rupture of right shoulder, not specified as traumatic: Secondary | ICD-10-CM

## 2023-08-05 ENCOUNTER — Ambulatory Visit: Payer: Medicaid Other | Admitting: Orthopaedic Surgery

## 2023-08-16 ENCOUNTER — Other Ambulatory Visit: Payer: Self-pay

## 2023-08-16 ENCOUNTER — Encounter (HOSPITAL_BASED_OUTPATIENT_CLINIC_OR_DEPARTMENT_OTHER): Payer: Self-pay | Admitting: Orthopaedic Surgery

## 2023-08-20 NOTE — H&P (Signed)
PREOPERATIVE H&P  Chief Complaint: RIGHT KNEE ACL TEAR  HPI: Steve Flores is a 38 y.o. male who is scheduled for, Procedure(s): KNEE ARTHROSCOPY WITH ANTERIOR CRUCIATE LIGAMENT (ACL) RECONSTRUCTION WITH TIBIAL ANTERIOR ALLOGRAFT.   Patient has a past medical history significant for RA.   He had an injury on Thanksgiving when he was playing football.  He was seen in the emergency room.  He went to see Dr. Hurman Horn and was told that he might have an ACL tear.  He has an MRI ordered for December 17th.  Symptoms are rated as moderate to severe, and have been worsening.  This is significantly impairing activities of daily living.    Please see clinic note for further details on this patient's care.    He has elected for surgical management.   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 phalanx   GERD (gastroesophageal reflux disease)    Inguinal hernia    Pinched nerve in neck    Rheumatoid arthritis [facility])    Past Surgical History:  Procedure Laterality Date   FINGER SURGERY     INGUINAL HERNIA REPAIR Left 10/07/2015   Procedure: LAPAROSCOPIC LEFT  INGUINAL HERNIA;  Surgeon: Axel Filler, MD;  Location: [facility];  Service: General;  Laterality: Left;   INSERTION OF MESH Left 10/07/2015   Procedure: INSERTION OF MESH;  Surgeon: Axel Filler, MD;  Location: [facility];  Service: General;  Laterality: Left;   Social History   Socioeconomic History   Marital status: Single    Spouse name: Not on file   Number of children: Not on file   Years of education: Not on file   Highest education level: Not on file  Occupational History   Not on file  Tobacco Use   Smoking status: Never    Passive exposure: Past   Smokeless tobacco: Never  Vaping Use   Vaping status: Never Used   Substance and Sexual Activity   Alcohol use: Not Currently    Comment: occasionally   Drug use: No   Sexual activity: Not on file  Other Topics Concern   Not on file  Social History Narrative   Designer, 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     Social Drivers of Corporate investment banker Strain: Low Risk  (01/28/2023)   Received from [facility], [facility]   Overall Financial Resource Strain (CARDIA)    Difficulty of Paying Living Expenses: Not very hard  Food Insecurity: No Food Insecurity (01/28/2023)   Received from [facility], [facility]   Hunger Vital Sign    Worried About Running Out of Food in the Last Year: Never true    Ran Out of Food in the Last Year: Never true  Transportation Needs: No Transportation Needs (01/28/2023)   Received from [facility], [facility]   PRAPARE - Transportation    Lack of Transportation (Medical): No    Lack of Transportation (Non-Medical): No  Physical Activity: Not on file  Stress: Not on file  Social Connections: Unknown (01/25/2023)   Received from [facility], [facility]   Social Network    Social Network: Not on file   Family History  Problem Relation Age of Onset   Rheum arthritis Mother  Sickle cell anemia Father    Asthma Sister    Healthy Son    No Known Allergies Prior to Admission medications   Medication Sig Start Date End Date Taking? Authorizing Provider  acetaminophen (TYLENOL) 325 MG tablet Take 650 mg by mouth every 6 (six) hours as needed for mild pain.    [provider]  anastrozole (ARIMIDEX) 1 MG tablet Take by mouth every other day. 03/14/22  Yes [provider]  busPIRone (BUSPAR) 5 MG tablet Take 5 mg by mouth daily.   Yes [provider]  folic acid (FOLVITE) 1 MG tablet Take 1 tablet (1 mg total) by mouth daily. 10/12/22  Yes Rice, Jamesetta Orleans, MD  gabapentin (NEURONTIN) 100 MG capsule Take 1 capsule (100 mg total) by mouth 3 (three) times daily. Patient  not taking: Reported on 08/16/2023 01/26/23 02/25/23  Alvira Monday, MD  hydrOXYzine (ATARAX/VISTARIL) 25 MG tablet Take 1 tablet (25 mg total) by mouth every 6 (six) hours. Patient not taking: Reported on 08/16/2023 10/29/19   Janace Aris, FNP  leflunomide (ARAVA) 20 MG tablet Take 1 tablet (20 mg total) by mouth daily. 04/23/23   Rice, Jamesetta Orleans, MD  meloxicam (MOBIC) 15 MG tablet Take 15 mg by mouth daily. Patient not taking: Reported on 08/16/2023 02/26/22   [provider]  methocarbamol (ROBAXIN) 500 MG tablet Take 2 tablets (1,000 mg total) by mouth every 6 (six) hours as needed for muscle spasms. 01/26/23  Yes Alvira Monday, MD  Methotrexate Sodium (METHOTREXATE, PF,) 50 MG/2ML injection Inject 0.8 mL into the skin once weekly. Patient not taking: Reported on 08/16/2023 01/17/23   Fuller Plan, MD  methylPREDNISolone (MEDROL DOSEPAK) 4 MG TBPK tablet As directed on pack Patient not taking: Reported on 04/19/2023 01/26/23   Alvira Monday, MD  oxyCODONE (ROXICODONE) 5 MG immediate release tablet Take 1 tablet (5 mg total) by mouth every 4 (four) hours as needed for severe pain. 01/26/23   Alvira Monday, MD  oxyCODONE-acetaminophen (PERCOCET/ROXICET) 5-325 MG tablet Take 1 tablet by mouth every 6 (six) hours as needed for severe pain (pain score 7-10). 06/20/23   Smitty Knudsen, PA-C  pantoprazole (PROTONIX) 20 MG tablet Take 1 tablet (20 mg total) by mouth daily. Patient not taking: Reported on 10/12/2022 01/25/22   Margarita Grizzle, MD  predniSONE (STERAPRED UNI-PAK 21 TAB) 10 MG (21) TBPK tablet Take by mouth. 12/07/22   [provider]  TUBERCULIN SYR 1CC/26GX3/8" 26G X 3/8" 1 ML MISC For injection of subcutaneous methotrexate once weekly 10/12/22   Fuller Plan, MD  Vitamin D, Ergocalciferol, (DRISDOL) 1.25 MG (50000 UNIT) CAPS capsule 1 capsule Orally ONCE A WEEK for 60 days 12/20/20  Yes [provider]  omeprazole (PRILOSEC) 20 MG capsule Take 1 capsule  (20 mg total) by mouth 2 (two) times daily. Patient not taking: Reported on 07/21/2017 01/21/17 03/06/19  Rolland Porter, MD  sucralfate (CARAFATE) 1 g tablet Take 1 tablet (1 g total) by mouth 4 (four) times daily. Patient not taking: Reported on 07/21/2017 01/21/17 03/06/19  Rolland Porter, MD    ROS: All other systems have been reviewed and were otherwise negative with the exception of those mentioned in the HPI and as above.  Physical Exam: General: Alert, no acute distress Cardiovascular: No pedal edema Respiratory: No cyanosis, no use of accessory musculature GI: No organomegaly, abdomen is soft and non-tender Skin: No lesions in the area of chief complaint Neurologic: Sensation intact distally Psychiatric: Patient is competent  for consent with normal mood and affect Lymphatic: No axillary or cervical lymphadenopathy  MUSCULOSKELETAL:  Range of motion of the knee is 0-90 degrees.  He has some moderate effusion.  He has increased translation with Lachman compared to the contralateral side.    Imaging: MRI was reviewed and demonstrates an osteochondral lesion consistent with a bone bruise and an ACL tear.   Assessment: RIGHT KNEE ACL TEAR  Plan: Plan for Procedure(s): KNEE ARTHROSCOPY WITH ANTERIOR CRUCIATE LIGAMENT (ACL) RECONSTRUCTION WITH TIBIAL ANTERIOR ALLOGRAFT  The risks benefits and alternatives were discussed with the patient including but not limited to the risks of nonoperative treatment, versus surgical intervention including infection, bleeding, nerve injury,  blood clots, cardiopulmonary complications, morbidity, mortality, among others, and they were willing to proceed.   The patient acknowledged the explanation, agreed to proceed with the plan and consent was signed.   Operative Plan: Right knee scope with allograft ACL reconstruction Discharge Medications: standard DVT Prophylaxis: aspirin Physical Therapy: outpatient PT Special Discharge needs: knee immobilizer.  [address]   Vernetta Honey, PA-C  08/20/2023 4:10 PM

## 2023-08-21 NOTE — Discharge Instructions (Signed)
Ramond Marrow MD, MPH Alfonse Alpers, PA-C [facility] [address], [address] [phone] (tel)   [phone] (fax)   POST-OPERATIVE INSTRUCTIONS - ACL RECONSTRUCTION  WOUND CARE You may remove the Operative Dressing on Post-Op Day #3 (72hrs after surgery).   Leave steri strips in place.   If you feel more comfortable with it you can leave all dressings in place till your 1 week follow-up appointment.   KEEP THE INCISIONS CLEAN AND DRY. An ACE wrap may be used to control swelling, do not wrap this too tight.  If the initial ACE wrap feels too tight or constricting you may loosen it. There may be a small amount of fluid/bleeding leaking at the surgical site.  This is normal; the knee is filled with fluid during the procedure and can leak for 24-48hrs after surgery.  You may change/reinforce the bandage as needed.  Use the Cryocuff, GameReady or Ice as often as possible for the first 3-4 days, then as needed for pain relief. Always keep a towel, ACE wrap or other barrier between the cooling unit and your skin.  You may shower on Post-Op Day #3. Gently pat the area dry.  Do not soak the knee in water.  Do not go swimming in the pool or ocean until 4 weeks after surgery or when otherwise instructed.  BRACE/AMBULATION Your leg will be placed in a brace post-operatively.  You may remove for hygiene only! You will need to wear your brace at all times until we discuss it further.  It should be locked in full extension (0 degrees) if adjustable.   You will be instructed on further bracing after your first visit. Use crutches for comfort but you can put your full weight on the leg as tolerated.  PHYSICAL THERAPY - You will begin physical therapy soon after surgery (unless otherwise specified) - Please call to set up an appointment, if you do not already have one  - Let our office if there are any issues with scheduling your therapy   - A PT referral was sent to [facility]  Outpatient PT in [facility]  REGIONAL ANESTHESIA (NERVE BLOCKS) The anesthesia team may have performed a nerve block for you this is a great tool used to minimize pain.   The block may start wearing off overnight (between 8-24 hours postop) When the block wears off, your pain may go from nearly zero to the pain you would have had postop without the block. This is an abrupt transition but nothing dangerous is happening.   This can be a challenging period but utilize your as needed pain medications to try and manage this period. We suggest you use the pain medication the first night prior to going to bed, to ease this transition.  You may take an extra dose of narcotic when this happens if needed  POST-OP MEDICATIONS- Multimodal approach to pain control In general your pain will be controlled with a combination of substances.  Prescriptions unless otherwise discussed are electronically sent to your pharmacy.  This is a carefully made plan we use to minimize narcotic use.      Acetaminophen - Non-narcotic pain medicine taken on a scheduled basis  Oxycodone - This is a strong narcotic, to be used only on an "as needed" basis for SEVERE pain. Aspirin 81mg  - This medicine is used to minimize the risk of blood clots after surgery. Zofran - take as needed for nausea   FOLLOW-UP Please call the office to schedule  a follow-up appointment for your incision check if you do not already have one, 7-10 days post-operatively. IF YOU HAVE ANY QUESTIONS, PLEASE FEEL FREE TO CALL OUR OFFICE.  HELPFUL INFORMATION   Keep your leg elevated to decrease swelling, which will then in turn decrease your pain. I would elevate the foot of your bed by putting a couple of couch pillows between your mattress and box spring. I would not keep pillow directly under your ankle.  You must wear the brace locked while sleeping and ambulating until follow-up.   There will be MORE swelling on days 1-3 than there is on the  day of surgery.  This also is normal. The swelling will decrease with the anti-inflammatory medication, ice and keeping it elevated. The swelling will make it more difficult to bend your knee. As the swelling goes down your motion will become easier  You may develop swelling and bruising that extends from your knee down to your calf and perhaps even to your foot over the next week. Do not be alarmed. This too is normal, and it is due to gravity  There may be some numbness adjacent to the incision site. This may last for 6-12 months or longer in some patients and is expected.  You may return to sedentary work/school in the next couple of days when you feel up to it. You will need to keep your leg elevated as much as possible   You should wean off your narcotic medicines as soon as you are able.  Most patients will be off narcotics before their first postop appointment.   We suggest you use the pain medication the first night prior to going to bed, in order to ease any pain when the anesthesia wears off. You should avoid taking pain medications on an empty stomach as it will make you nauseous.  Do not drink alcoholic beverages or take illicit drugs when taking pain medications.  It is against the law to drive while taking narcotics. You cannot drive if your Right leg is in brace locked in extension.  Pain medication may make you constipated.  Below are a few solutions to try in this order: Decrease the amount of pain medication if you aren't having pain. Drink lots of decaffeinated fluids. Drink prune juice and/or each dried prunes  If the first 3 don't work start with additional solutions Take Colace - an over-the-counter stool softener Take Senokot - an over-the-counter laxative Take Miralax - a stronger over-the-counter laxative  For more information including helpful videos and documents visit our website:   [email or web address]    Post Anesthesia  Home Care Instructions  Activity: Get plenty of rest for the remainder of the day. A responsible individual must stay with you for 24 hours following the procedure.  For the next 24 hours, DO NOT: -Drive a car -Advertising copywriter -Drink alcoholic beverages -Take any medication unless instructed by your physician -Make any legal decisions or sign important papers.  Meals: Start with liquid foods such as gelatin or soup. Progress to regular foods as tolerated. Avoid greasy, spicy, heavy foods. If nausea and/or vomiting occur, drink only clear liquids until the nausea and/or vomiting subsides. Call your physician if vomiting continues.  Special Instructions/Symptoms: Your throat may feel dry or sore from the anesthesia or the breathing tube placed in your throat during surgery. If this causes discomfort, gargle with warm salt water. The discomfort should disappear within 24 hours.    Regional Anesthesia Blocks  1. You may  not be able to move or feel the "blocked" extremity after a regional anesthetic block. This may last may last from 3-48 hours after placement, but it will go away. The length of time depends on the medication injected and your individual response to the medication. As the nerves start to wake up, you may experience tingling as the movement and feeling returns to your extremity. If the numbness and inability to move your extremity has not gone away after 48 hours, please call your surgeon.   2. The extremity that is blocked will need to be protected until the numbness is gone and the strength has returned. Because you cannot feel it, you will need to take extra care to avoid injury. Because it may be weak, you may have difficulty moving it or using it. You may not know what position it is in without looking at it while the block is in effect.  3. For blocks in the legs and feet, returning to weight bearing and walking needs to be done carefully. You will need to wait until the  numbness is entirely gone and the strength has returned. You should be able to move your leg and foot normally before you try and bear weight or walk. You will need someone to be with you when you first try to ensure you do not fall and possibly risk injury.  4. Bruising and tenderness at the needle site are common side effects and will resolve in a few days.  5. Persistent numbness or new problems with movement should be communicated to the surgeon or the [facility] [phone] [facility] [phone]).  Next dose of Tylenol can be taken at 130pm today.

## 2023-08-21 NOTE — Anesthesia Preprocedure Evaluation (Signed)
Anesthesia Evaluation  Patient identified by MRN, date of birth, ID band Patient awake    Reviewed: Allergy & Precautions, NPO status , Patient's Chart, lab work & pertinent test results  History of Anesthesia Complications Negative for: history of anesthetic complications  Airway Mallampati: II  TM Distance: >3 FB Neck ROM: Full    Dental  (+) Dental Advisory Given, Teeth Intact   Pulmonary neg pulmonary ROS   Pulmonary exam normal        Cardiovascular negative cardio ROS Normal cardiovascular exam     Neuro/Psych negative neurological ROS  negative psych ROS   GI/Hepatic Neg liver ROS,GERD  Controlled and Medicated,,  Endo/Other  negative endocrine ROS    Renal/GU negative Renal ROS     Musculoskeletal  (+) Arthritis , Rheumatoid disorders,    Abdominal   Peds  Hematology negative hematology ROS (+)   Anesthesia Other Findings   Reproductive/Obstetrics                             Anesthesia Physical Anesthesia Plan  ASA: 2  Anesthesia Plan: General   Post-op Pain Management: Regional block*, Tylenol PO (pre-op)* and Celebrex PO (pre-op)*   Induction: Intravenous  PONV Risk Score and Plan: 2 and Treatment may vary due to age or medical condition, Ondansetron, Dexamethasone and Midazolam  Airway Management Planned: LMA  Additional Equipment: None  Intra-op Plan:   Post-operative Plan: Extubation in OR  Informed Consent: I have reviewed the patients History and Physical, chart, labs and discussed the procedure including the risks, benefits and alternatives for the proposed anesthesia with the patient or authorized representative who has indicated his/her understanding and acceptance.     Dental advisory given  Plan Discussed with: CRNA and Anesthesiologist  Anesthesia Plan Comments:        Anesthesia Quick Evaluation

## 2023-08-22 ENCOUNTER — Encounter (HOSPITAL_BASED_OUTPATIENT_CLINIC_OR_DEPARTMENT_OTHER)
Admission: RE | Disposition: A | Discharge status: Home / Self Care | Payer: Self-pay | Source: Home / Self Care | Attending: Orthopaedic Surgery

## 2023-08-22 ENCOUNTER — Ambulatory Visit (HOSPITAL_BASED_OUTPATIENT_CLINIC_OR_DEPARTMENT_OTHER): Payer: Medicaid Other | Admitting: Anesthesiology

## 2023-08-22 ENCOUNTER — Other Ambulatory Visit: Payer: Self-pay

## 2023-08-22 ENCOUNTER — Encounter (HOSPITAL_BASED_OUTPATIENT_CLINIC_OR_DEPARTMENT_OTHER): Payer: Self-pay | Admitting: Orthopaedic Surgery

## 2023-08-22 ENCOUNTER — Ambulatory Visit (HOSPITAL_BASED_OUTPATIENT_CLINIC_OR_DEPARTMENT_OTHER): Payer: Medicaid Other

## 2023-08-22 ENCOUNTER — Ambulatory Visit (HOSPITAL_BASED_OUTPATIENT_CLINIC_OR_DEPARTMENT_OTHER)
Admission: RE | Admit: 2023-08-22 | Discharge: 2023-08-22 | Disposition: A | Discharge status: Home / Self Care | Payer: Medicaid Other | Attending: Orthopaedic Surgery | Admitting: Orthopaedic Surgery

## 2023-08-22 ENCOUNTER — Ambulatory Visit (HOSPITAL_BASED_OUTPATIENT_CLINIC_OR_DEPARTMENT_OTHER): Payer: Self-pay | Admitting: Anesthesiology

## 2023-08-22 DIAGNOSIS — Y9361 Activity, american tackle football: Secondary | ICD-10-CM | POA: Diagnosis not present

## 2023-08-22 DIAGNOSIS — Z791 Long term (current) use of non-steroidal anti-inflammatories (NSAID): Secondary | ICD-10-CM | POA: Insufficient documentation

## 2023-08-22 DIAGNOSIS — S83511A Sprain of anterior cruciate ligament of right knee, initial encounter: Secondary | ICD-10-CM

## 2023-08-22 DIAGNOSIS — M069 Rheumatoid arthritis, unspecified: Secondary | ICD-10-CM | POA: Insufficient documentation

## 2023-08-22 DIAGNOSIS — Z7969 Long term (current) use of other immunomodulators and immunosuppressants: Secondary | ICD-10-CM | POA: Diagnosis not present

## 2023-08-22 DIAGNOSIS — K219 Gastro-esophageal reflux disease without esophagitis: Secondary | ICD-10-CM | POA: Diagnosis not present

## 2023-08-22 DIAGNOSIS — Z79631 Long term (current) use of antimetabolite agent: Secondary | ICD-10-CM | POA: Insufficient documentation

## 2023-08-22 DIAGNOSIS — Z79899 Other long term (current) drug therapy: Secondary | ICD-10-CM | POA: Diagnosis not present

## 2023-08-22 DIAGNOSIS — Z79811 Long term (current) use of aromatase inhibitors: Secondary | ICD-10-CM | POA: Diagnosis not present

## 2023-08-22 SURGERY — KNEE ARTHROSCOPY WITH ANTERIOR CRUCIATE LIGAMENT (ACL) RECONSTRUCTION WITH HAMSTRING GRAFT
Anesthesia: General | Site: Knee | Laterality: Right

## 2023-08-22 MED ORDER — CEFAZOLIN SODIUM-DEXTROSE 2-4 GM/100ML-% IV SOLN
INTRAVENOUS | Status: AC
  Filled 2023-08-22: qty 100

## 2023-08-22 MED ORDER — PROPOFOL 10 MG/ML IV BOLUS
INTRAVENOUS | Status: DC | PRN
  Administered 2023-08-22: 200 mg via INTRAVENOUS

## 2023-08-22 MED ORDER — LIDOCAINE 2% (20 MG/ML) 5 ML SYRINGE
INTRAMUSCULAR | Status: AC
  Filled 2023-08-22: qty 5

## 2023-08-22 MED ORDER — FENTANYL CITRATE (PF) 100 MCG/2ML IJ SOLN
INTRAMUSCULAR | Status: DC | PRN
  Administered 2023-08-22 (×2): 25 ug via INTRAVENOUS
  Administered 2023-08-22: 50 ug via INTRAVENOUS

## 2023-08-22 MED ORDER — FENTANYL CITRATE (PF) 100 MCG/2ML IJ SOLN
25.0000 ug | INTRAMUSCULAR | Status: DC | PRN
  Administered 2023-08-22: 50 ug via INTRAVENOUS

## 2023-08-22 MED ORDER — HYDROMORPHONE HCL 1 MG/ML IJ SOLN
INTRAMUSCULAR | Status: AC
  Filled 2023-08-22: qty 0.5

## 2023-08-22 MED ORDER — SODIUM CHLORIDE 0.9 % IV SOLN: 12.5000 mg | INTRAVENOUS | Status: DC | PRN

## 2023-08-22 MED ORDER — ACETAMINOPHEN 500 MG PO TABS: 1000.0000 mg | ORAL_TABLET | Freq: Once | ORAL | Status: AC

## 2023-08-22 MED ORDER — OXYCODONE HCL 5 MG/5ML PO SOLN: 5.0000 mg | Freq: Once | ORAL | Status: AC | PRN

## 2023-08-22 MED ORDER — GABAPENTIN 300 MG PO CAPS
300.0000 mg | ORAL_CAPSULE | Freq: Once | ORAL | Status: AC
  Administered 2023-08-22: 300 mg via ORAL

## 2023-08-22 MED ORDER — OXYCODONE HCL 5 MG PO TABS
5.0000 mg | ORAL_TABLET | Freq: Once | ORAL | Status: AC | PRN
  Administered 2023-08-22: 5 mg via ORAL

## 2023-08-22 MED ORDER — MIDAZOLAM HCL 2 MG/2ML IJ SOLN
2.0000 mg | Freq: Once | INTRAMUSCULAR | Status: AC
  Administered 2023-08-22: 2 mg via INTRAVENOUS

## 2023-08-22 MED ORDER — GABAPENTIN 300 MG PO CAPS
ORAL_CAPSULE | ORAL | Status: AC
  Filled 2023-08-22: qty 1

## 2023-08-22 MED ORDER — FENTANYL CITRATE (PF) 100 MCG/2ML IJ SOLN
INTRAMUSCULAR | Status: AC
  Filled 2023-08-22: qty 2

## 2023-08-22 MED ORDER — DEXAMETHASONE SODIUM PHOSPHATE 10 MG/ML IJ SOLN
INTRAMUSCULAR | Status: DC | PRN
  Administered 2023-08-22: 10 mg via INTRAVENOUS

## 2023-08-22 MED ORDER — OXYCODONE HCL 5 MG PO TABS: ORAL_TABLET | ORAL | 0 refills | Status: AC

## 2023-08-22 MED ORDER — LIDOCAINE HCL (CARDIAC) PF 100 MG/5ML IV SOSY
PREFILLED_SYRINGE | INTRAVENOUS | Status: DC | PRN
  Administered 2023-08-22: 60 mg via INTRAVENOUS

## 2023-08-22 MED ORDER — HYDROMORPHONE HCL 1 MG/ML IJ SOLN
0.5000 mg | INTRAMUSCULAR | Status: DC | PRN
  Administered 2023-08-22 (×2): 0.5 mg via INTRAVENOUS

## 2023-08-22 MED ORDER — MIDAZOLAM HCL 2 MG/2ML IJ SOLN
INTRAMUSCULAR | Status: AC
  Filled 2023-08-22: qty 2

## 2023-08-22 MED ORDER — DEXAMETHASONE SODIUM PHOSPHATE 10 MG/ML IJ SOLN
INTRAMUSCULAR | Status: AC
  Filled 2023-08-22: qty 1

## 2023-08-22 MED ORDER — ASPIRIN 81 MG PO CHEW: 81.0000 mg | CHEWABLE_TABLET | Freq: Two times a day (BID) | ORAL | 0 refills | Status: AC

## 2023-08-22 MED ORDER — AMISULPRIDE (ANTIEMETIC) 5 MG/2ML IV SOLN: 10.0000 mg | Freq: Once | INTRAVENOUS | Status: DC | PRN

## 2023-08-22 MED ORDER — ACETAMINOPHEN 500 MG PO TABS
1000.0000 mg | ORAL_TABLET | Freq: Once | ORAL | Status: AC
  Administered 2023-08-22: 1000 mg via ORAL

## 2023-08-22 MED ORDER — ACETAMINOPHEN 500 MG PO TABS
ORAL_TABLET | ORAL | Status: AC
  Filled 2023-08-22: qty 2

## 2023-08-22 MED ORDER — LACTATED RINGERS IV SOLN: INTRAVENOUS | Status: DC

## 2023-08-22 MED ORDER — PROPOFOL 10 MG/ML IV BOLUS
INTRAVENOUS | Status: AC
  Filled 2023-08-22: qty 20

## 2023-08-22 MED ORDER — ONDANSETRON HCL 4 MG/2ML IJ SOLN
INTRAMUSCULAR | Status: DC | PRN
  Administered 2023-08-22: 4 mg via INTRAVENOUS

## 2023-08-22 MED ORDER — FENTANYL CITRATE (PF) 100 MCG/2ML IJ SOLN
50.0000 ug | Freq: Once | INTRAMUSCULAR | Status: AC
  Administered 2023-08-22: 50 ug via INTRAVENOUS

## 2023-08-22 MED ORDER — CEFAZOLIN SODIUM-DEXTROSE 2-4 GM/100ML-% IV SOLN
2.0000 g | INTRAVENOUS | Status: AC
  Administered 2023-08-22: 2 g via INTRAVENOUS

## 2023-08-22 MED ORDER — ROPIVACAINE HCL 7.5 MG/ML IJ SOLN
INTRAMUSCULAR | Status: DC | PRN
  Administered 2023-08-22: 20 mL via PERINEURAL

## 2023-08-22 MED ORDER — OXYCODONE HCL 5 MG PO TABS
ORAL_TABLET | ORAL | Status: AC
  Filled 2023-08-22: qty 1

## 2023-08-22 MED ORDER — VANCOMYCIN HCL 1000 MG IV SOLR
INTRAVENOUS | Status: DC | PRN
  Administered 2023-08-22: 1000 mg via TOPICAL

## 2023-08-22 MED ORDER — ONDANSETRON HCL 4 MG/2ML IJ SOLN
INTRAMUSCULAR | Status: AC
Start: 2023-08-22 — End: ?
  Filled 2023-08-22: qty 2

## 2023-08-22 MED ORDER — ONDANSETRON HCL 4 MG PO TABS: 4.0000 mg | ORAL_TABLET | Freq: Three times a day (TID) | ORAL | 0 refills | Status: AC | PRN

## 2023-08-22 MED ORDER — ACETAMINOPHEN 500 MG PO TABS: 1000.0000 mg | ORAL_TABLET | Freq: Three times a day (TID) | ORAL | 0 refills | Status: AC

## 2023-08-22 MED ORDER — SODIUM CHLORIDE 0.9 % IR SOLN
Status: DC | PRN
  Administered 2023-08-22: 6000 mL

## 2023-08-22 MED ORDER — PROPOFOL 10 MG/ML IV BOLUS
INTRAVENOUS | Status: AC
Start: 2023-08-22 — End: ?
  Filled 2023-08-22: qty 20

## 2023-08-22 SURGICAL SUPPLY — 73 items
ANCHOR TIGHTROPE II 20 W/IB (Anchor) IMPLANT
BLADE SHAVER BONE 5.0X13 (MISCELLANEOUS) ×1 IMPLANT
BLADE SURG 10 STRL SS (BLADE) ×1 IMPLANT
BLADE SURG 15 STRL LF DISP TIS (BLADE) ×1 IMPLANT
BNDG COHESIVE 4X5 TAN STRL LF (GAUZE/BANDAGES/DRESSINGS) IMPLANT
BNDG ELASTIC 6INX 5YD STR LF (GAUZE/BANDAGES/DRESSINGS) ×1 IMPLANT
BONE TUNNEL PLUG CANNULATED (MISCELLANEOUS) IMPLANT
BURR OVAL 8 FLU 4.0X13 (MISCELLANEOUS) IMPLANT
CHLORAPREP W/TINT 26 (MISCELLANEOUS) ×1 IMPLANT
CLSR STERI-STRIP ANTIMIC 1/2X4 (GAUZE/BANDAGES/DRESSINGS) IMPLANT
COLLECTOR GRAFT TISSUE (SYSTAGENIX WOUND MANAGEMENT)
COOLER ICEMAN CLASSIC (MISCELLANEOUS) ×1 IMPLANT
COVER BACK TABLE 60X90IN (DRAPES) ×1 IMPLANT
CUFF TRNQT CYL 34X4.125X (TOURNIQUET CUFF) IMPLANT
CUTTER TENSIONER SUT 2-0 0 FBW (INSTRUMENTS) IMPLANT
DISSECTOR 3.5MM X 13CM CVD (MISCELLANEOUS) IMPLANT
DISSECTOR 4.0MMX13CM CVD (MISCELLANEOUS) ×1 IMPLANT
DRAPE IMP U-DRAPE 54X76 (DRAPES) IMPLANT
DRAPE OEC MINIVIEW 54X84 (DRAPES) ×1 IMPLANT
DRAPE U-SHAPE 47X51 STRL (DRAPES) ×1 IMPLANT
DRAPE-T ARTHROSCOPY W/POUCH (DRAPES) ×1 IMPLANT
ELECT REM PT RETURN 9FT ADLT (ELECTROSURGICAL) ×1
ELECTRODE REM PT RTRN 9FT ADLT (ELECTROSURGICAL) ×1 IMPLANT
FIBERSTICK 2 (SUTURE) IMPLANT
GAUZE SPONGE 4X4 12PLY STRL (GAUZE/BANDAGES/DRESSINGS) ×2 IMPLANT
GLOVE BIO SURGEON STRL SZ 6.5 (GLOVE) ×1 IMPLANT
GLOVE BIOGEL PI IND STRL 6.5 (GLOVE) ×1 IMPLANT
GLOVE BIOGEL PI IND STRL 8 (GLOVE) ×1 IMPLANT
GLOVE ECLIPSE 8.0 STRL XLNG CF (GLOVE) ×1 IMPLANT
GOWN STRL REUS W/ TWL LRG LVL3 (GOWN DISPOSABLE) ×2 IMPLANT
GOWN STRL REUS W/TWL XL LVL3 (GOWN DISPOSABLE) ×1 IMPLANT
GRAFT TISS ANT TIB TNDN (Tissue) IMPLANT
IMMOBILIZER KNEE 20 (SOFTGOODS)
IMMOBILIZER KNEE 20 THIGH 36 (SOFTGOODS) IMPLANT
IMMOBILIZER KNEE 22 UNIV (SOFTGOODS) IMPLANT
IMMOBILIZER KNEE 24 THIGH 36 (MISCELLANEOUS) IMPLANT
IMMOBILIZER KNEE 24 UNIV (MISCELLANEOUS) ×1
IV NS IRRIG 3000ML ARTHROMATIC (IV SOLUTION) ×4 IMPLANT
KIT TRANSTIBIAL (DISPOSABLE) ×1 IMPLANT
NDL SAFETY ECLIPSE 18X1.5 (NEEDLE) ×1 IMPLANT
NDL SUT 2-0 SCORPION KNEE (NEEDLE) IMPLANT
NEEDLE SUT 2-0 SCORPION KNEE (NEEDLE)
NS IRRIG 1000ML POUR BTL (IV SOLUTION) ×1 IMPLANT
PACK ARTHROSCOPY DSU (CUSTOM PROCEDURE TRAY) ×1 IMPLANT
PACK BASIN DAY SURGERY FS (CUSTOM PROCEDURE TRAY) IMPLANT
PAD CAST 4YDX4 CTTN HI CHSV (CAST SUPPLIES) ×1 IMPLANT
PAD COLD SHLDR WRAP-ON (PAD) ×1 IMPLANT
PENCIL SMOKE EVACUATOR (MISCELLANEOUS) ×1 IMPLANT
PIN DRILL ACL TIGHTROPE 4MM (PIN) IMPLANT
PORTAL SKID DEVICE (INSTRUMENTS) IMPLANT
SCREW FT BIOCOMP 9X30 (Screw) IMPLANT
SHEET MEDIUM DRAPE 40X70 STRL (DRAPES) ×1 IMPLANT
SLEEVE SCD COMPRESS KNEE MED (STOCKING) ×1 IMPLANT
SPIKE FLUID TRANSFER (MISCELLANEOUS) IMPLANT
SPONGE T-LAP 4X18 ~~LOC~~+RFID (SPONGE) ×1 IMPLANT
SUT 0 FIBERLOOP 38 BLUE TPR ND (SUTURE)
SUT 2 FIBERLOOP 20 STRT BLUE (SUTURE) ×2
SUT FIBERWIRE #2 38 T-5 BLUE (SUTURE) ×1
SUT MNCRL AB 4-0 PS2 18 (SUTURE) ×1 IMPLANT
SUT PDS AB 0 CT 36 (SUTURE) IMPLANT
SUT VIC AB 0 CT1 27XBRD ANBCTR (SUTURE) ×2 IMPLANT
SUT VIC AB 1 CT1 27XBRD ANBCTR (SUTURE) IMPLANT
SUT VIC AB 3-0 SH 27X BRD (SUTURE) ×1 IMPLANT
SUTURE 0 FIBERLP 38 BLU TPR ND (SUTURE) IMPLANT
SUTURE 2 FIBERLOOP 20 STRT BLU (SUTURE) IMPLANT
SUTURE FIBERWR #2 38 T-5 BLUE (SUTURE) IMPLANT
TENDON ANTERIOR TIBIALIS (Tissue) ×1 IMPLANT
TISSUE GRAFT COLLECTOR (SYSTAGENIX WOUND MANAGEMENT) IMPLANT
TOWEL GREEN STERILE FF (TOWEL DISPOSABLE) ×1 IMPLANT
TUBE CONNECTING 20X1/4 (TUBING) ×1 IMPLANT
TUBE SUCTION HIGH CAP CLEAR NV (SUCTIONS) ×1 IMPLANT
TUBING ARTHROSCOPY IRRIG 16FT (MISCELLANEOUS) ×1 IMPLANT
WAND ABLATOR APOLLO I90 (BUR) ×1 IMPLANT

## 2023-08-22 NOTE — Op Note (Signed)
Orthopaedic Surgery Operative Note (CSN: [id number])  Steve Flores  1985/08/25 Date of Surgery: 08/22/2023   Diagnoses:  RIGHT KNEE ACL TEAR  Procedure: Right allograft tibialis anterior ACL reconstruction   Operative Finding Patient's knee was quite tight laterally however clearly unstable Lachman.  The remainder of his knee was essentially pristine with the exception of an ACL tear.   Post-operative plan: The patient will be weightbearing to tolerance.  The patient will be discharged home.  DVT prophylaxis Aspirin 81 mg twice daily for 6 weeks.  Pain control with PRN pain medication preferring oral medicines.  Follow up plan will be scheduled in approximately 7 days for incision check and XR.  Post-Op Diagnosis: Same Surgeons:Primary: Bjorn Pippin, MD Assistants:Angela Margo Aye RN-FA, Alfonse Alpers, PA-C Location: [facility] OR ROOM 6 Anesthesia: General with local Antibiotics: Ancef 2 g with local vancomycin powder 1 g at the surgical site Tourniquet time:  Total Tourniquet Time Documented: Thigh (Right) - 42 minutes Total: Thigh (Right) - 42 minutes  Estimated Blood Loss: Minimal Complications: None Specimens: None Implants: Implant Name Type Inv. Item Serial No. Manufacturer Lot No. LRB No. Used Action  TENDON ANTERIOR TIBIALIS - [id number] Tissue TENDON ANTERIOR TIBIALIS [id number] [facility] [id number] Right 1 Implanted  ANCHOR TIGHTROPE II 20 W/IB - [id number] Anchor ANCHOR TIGHTROPE II 20 W/IB  [employer] [id number] Right 1 Implanted  SCREW FT BIOCOMP 9X30 - [id number] Screw SCREW FT BIOCOMP 9X30  [employer] [id number] Right 1 Implanted    Indications for Surgery:   Steve Flores is a 38 y.o. male with ACL rupture.  Benefits and risks of operative and nonoperative management were discussed prior to surgery with patient/guardian(s) and informed consent form was completed.  Specific risks including infection, need for additional surgery, rerupture, stiffness, pain  amongst others   Procedure:   The patient was identified properly. Informed consent was obtained and the surgical site was marked. The patient was taken up to suite where general anesthesia was induced. The patient was placed in the supine position with a post against the surgical leg and a nonsterile tourniquet applied. The surgical leg was then prepped and draped usual sterile fashion.  A standard surgical timeout was performed.  2 standard anterior portals were made and diagnostic arthroscopy performed. Please note the findings as noted above.  A tibialis anterior graft was prepared on the back table to a size of 9 mm and 100 mm in length  We began arthroscopy and made our lateral and medial portals in the typical fashion. Fat pad was resected and diagnostic arthroscopy performed with the findings listed above.   The anterior cruciate ligament stump was debrided utilizing a shaver taking great care to preserve the remnant stump on the femur and the tibia for localization of our tunnels. Once the remnant anterior cruciate ligament was removed and we obtained appropriate visualization by performing a small notchplasty and confirmed that we had indeed identify the over-the-top position. We made small marks at the location of the aperture of the tibial and femoral tunnels and double checked our location prior to drilling.  Once we identified the appropriate position of the femoral aperture of the ACL we hyperflexed the knee and used a 7 mm offset guide through an anterior medial portal to drill a pin.  We then reamed a 9 mm tunnel by 30 mm in length.  We overreamed from the lateral side with a 4 mm reamer to allow passage of our button.  We shuttled a  passing suture.  We then turned our attention to the tibial side.  We cleared the tissue from the tibial aperture using the anterior horn of the lateral meniscus as a guide.  We were able to then use a freehand technique to place a 2 4 pin in the  appropriate anatomic position.  We reamed a complete tunnel.  We shuttled our suture for eventual passage.  We began passing the graft through the tibial tunnel in an antegrade fashion.  Femoral fixation was with a tight rope device and we checked its placement on the lateral periosteum with fluoroscopy.  We verified arthroscopically that there is no sign of graft impingement on the notch. We then cycled the knee multiple times and turned our attention to the tibia.  Tibia was fixed with a 9x 30 mm bio composite Arthrex screw.  There was good purchase of the screw and the screw protrusion thus we felt there is no need to back up this fixation.  At this point a gentle Lachman maneuver was performed and there is a stable endpoint and no translation.   Incisions closed with absorbable suture. The patient was awoken from general anesthesia and taken to the PACU in stable condition without complication.   Alfonse Alpers, PA-C, present throughout the case, critical for completion in a timely fashion, and for retraction, instrumentation, closure.

## 2023-08-22 NOTE — Transfer of Care (Signed)
Immediate Anesthesia Transfer of Care Note  Patient: Steve Flores  Procedure(s) Performed: KNEE ARTHROSCOPY WITH ANTERIOR CRUCIATE LIGAMENT (ACL) RECONSTRUCTION WITH TIBIAL ANTERIOR ALLOGRAFT (Right: Knee)  Patient Location: PACU  Anesthesia Type:GA combined with regional for post-op pain  Level of Consciousness: drowsy, patient cooperative, and responds to stimulation  Airway & Oxygen Therapy: Patient Spontanous Breathing and Patient connected to face mask oxygen  Post-op Assessment: Report given to RN and Post -op Vital signs reviewed and stable  Post vital signs: Reviewed and stable  Last Vitals:  Vitals Value Taken Time  BP    Temp    Pulse    Resp 15 08/22/23 0949  SpO2    Vitals shown include unfiled device data.  Last Pain:  Vitals:   08/22/23 0711  TempSrc: Temporal  PainSc: 0-No pain         Complications: No notable events documented.

## 2023-08-22 NOTE — Anesthesia Postprocedure Evaluation (Signed)
Anesthesia Post Note  Patient: Steve Flores  Procedure(s) Performed: KNEE ARTHROSCOPY WITH ANTERIOR CRUCIATE LIGAMENT (ACL) RECONSTRUCTION WITH TIBIAL ANTERIOR ALLOGRAFT (Right: Knee)     Patient location during evaluation: PACU Anesthesia Type: General Level of consciousness: awake and alert Pain management: pain level controlled Vital Signs Assessment: post-procedure vital signs reviewed and stable Respiratory status: spontaneous breathing, nonlabored ventilation and respiratory function stable Cardiovascular status: stable and blood pressure returned to baseline Anesthetic complications: no   No notable events documented.  Last Vitals:  Vitals:   08/22/23 1030 08/22/23 1053  BP: (!) 141/103 (!) 149/97  Pulse: 86 84  Resp: 14 16  Temp:  36.4 C  SpO2: 94% 97%                    Beryle Lathe

## 2023-08-22 NOTE — Interval H&P Note (Signed)
All questions answered, patient wants to proceed with procedure. ? ?

## 2023-08-22 NOTE — Anesthesia Procedure Notes (Signed)
Procedure Name: LMA Insertion Date/Time: 08/22/2023 8:47 AM  Performed by: Thornell Mule, CRNAPre-anesthesia Checklist: Patient identified, Emergency Drugs available, Suction available and Patient being monitored Patient Re-evaluated:Patient Re-evaluated prior to induction Oxygen Delivery Method: Circle system utilized Preoxygenation: Pre-oxygenation with 100% oxygen Induction Type: IV induction LMA: LMA inserted LMA Size: 4.0 Number of attempts: 1 Placement Confirmation: positive ETCO2 Tube secured with: Tape Dental Injury: Teeth and Oropharynx as per pre-operative assessment

## 2023-08-22 NOTE — Progress Notes (Signed)
Assisted Dr. Mal Amabile with right, adductor canal, ultrasound guided block. Side rails up, monitors on throughout procedure. See vital signs in flow sheet. Tolerated Procedure well.

## 2023-08-22 NOTE — Anesthesia Procedure Notes (Signed)
Anesthesia Regional Block: Adductor canal block   Pre-Anesthetic Checklist: , timeout performed,  Correct Patient, Correct Site, Correct Laterality,  Correct Procedure, Correct Position, site marked,  Risks and benefits discussed,  Surgical consent,  Pre-op evaluation,  At surgeon's request and post-op pain management  Laterality: Right  Prep: chloraprep       Needles:  Injection technique: Single-shot  Needle Type: Echogenic Needle     Needle Length: 10cm  Needle Gauge: 21     Additional Needles:   Narrative:  Start time: 08/22/2023 8:12 AM End time: 08/22/2023 8:15 AM Injection made incrementally with aspirations every 5 mL.  Performed by: Personally  Anesthesiologist: Beryle Lathe, MD  Additional Notes: No pain on injection. No increased resistance to injection. Injection made in 5cc increments. Good needle visualization. Patient tolerated the procedure well.

## 2023-08-28 NOTE — Therapy (Signed)
 OUTPATIENT PHYSICAL THERAPY LOWER EXTREMITY EVALUATION   Patient Name: Steve Flores MRN: [id number] DOB:05/28/1986, 38 y.o., male Today's Date: 08/30/2023  END OF SESSION:  PT End of Session - 08/29/23 1243     Visit Number 1    Number of Visits 16    Date for PT Re-Evaluation 10/25/23    Authorization Type [employer]    PT Start Time 1240    PT Stop Time 1322    PT Time Calculation (min) 42 min    Equipment Utilized During Treatment Right knee immobilizer    Activity Tolerance Patient tolerated treatment well    Behavior During Therapy Anxious;WFL for tasks assessed/performed             Past Medical History:  Diagnosis Date   Abrasion of finger of right hand 12/28/2011   healing wound right index finger   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 phalanx   GERD (gastroesophageal reflux disease)    Inguinal hernia    Pinched nerve in neck    Rheumatoid arthritis [facility])    Past Surgical History:  Procedure Laterality Date   FINGER SURGERY     INGUINAL HERNIA REPAIR Left 10/07/2015   Procedure: LAPAROSCOPIC LEFT  INGUINAL HERNIA;  Surgeon: Lynda Leos, MD;  Location: [facility];  Service: General;  Laterality: Left;   INSERTION OF MESH Left 10/07/2015   Procedure: INSERTION OF MESH;  Surgeon: Lynda Leos, MD;  Location: [facility];  Service: General;  Laterality: Left;   Patient Active Problem List   Diagnosis Date Noted   Unspecified rotator cuff tear or rupture of right shoulder, not specified as traumatic 10/12/2022   Patellofemoral crepitus 10/12/2022   Seronegative rheumatoid arthritis (HCC) 03/28/2022   High risk medication use 03/28/2022   Educated about COVID-19 virus infection 08/06/2019   Precordial chest pain 06/20/2019   Nonspecific abnormal electrocardiogram (ECG) (EKG) 06/20/2019   Bilateral carpal tunnel syndrome 01/10/2018   Laryngopharyngeal reflux (LPR) 11/04/2017   Persistent  hoarseness 11/04/2017    PCP: [place], [place]  PA  REFERRING PROVIDER: Cristy Earnest MD   REFERRING DIAG: Right knee arthroscopy with ACL reconstruction with allograft  1/30  THERAPY DIAG:  Aftercare for anterior cruciate ligament (ACL) repair  Acute pain of right knee  Localized edema  Stiffness of right knee, not elsewhere classified  Difficulty in walking, not elsewhere classified  Rationale for Evaluation and Treatment: Rehabilitation  ONSET DATE: 08/22/23  SUBJECTIVE:   SUBJECTIVE STATEMENT: Pt injured knee while playing football on 06/20/23. Reports that he was playing football when he planted his foot and went to change directions which time he felt a pop and sudden pain in the right knee. Eventually he underwent ACL repair by Dr. Cristy,( Right knee arthroscopy with ACL reconstruction with allograft  1/30) and is seeing him today for f/u. He is WBAT. Using KI at all times.  Not working on ROM yet. He has a lot of pain. No red flags.  He has been seeing MD for shoulder pain but that has been put on hold.   PERTINENT HISTORY: RA, shoulder pain  Date of Surgery: 08/22/2023 Right knee arthroscopy with ACL reconstruction with allograft   PAIN:  Are you having pain? Yes: NPRS scale: 7/10 Pain location: R tknee A/P  Pain description: throbbing, sore Aggravating factors: dependent position, no knee support, moving it  Relieving factors: pillows, cold pack  PRECAUTIONS: Other: protocol  RED FLAGS: None   WEIGHT BEARING RESTRICTIONS: Yes WBAT  FALLS:  Has patient fallen in last 6 months? No  LIVING ENVIRONMENT: Lives with: lives alone Lives in: House/apartment Stairs: Yes: External: 5-6 steps; can reach both Has following equipment at home: Crutches  OCCUPATION: Was a [employer].    PLOF: Independent  PATIENT GOALS: I want to get back to my old self, active and pain free   NEXT MD VISIT: Today   OBJECTIVE:  Note: Objective measures were completed at Evaluation  unless otherwise noted.  DIAGNOSTIC FINDINGS: not available MRI was reviewed and demonstrates an osteochondral lesion consistent with a bone bruise and an ACL tear.     PATIENT SURVEYS:  LEFS 6/80   COGNITION: Overall cognitive status: Within functional limits for tasks assessed     SENSATION: WFL  EDEMA:  Circumferential: Rt 42 cm and Lt. 39 cm   MUSCLE LENGTH: NT   POSTURE: weight shift left guarded   PALPATION: Did not tolerate patellar mobilization , pain and tender to touch   LOWER EXTREMITY ROM:  Active ROM Right eval Left eval  Hip flexion    Hip extension    Hip abduction    Hip adduction    Hip internal rotation    Hip external rotation    Knee flexion 45 supine 50 seated    Knee extension -10    Ankle dorsiflexion    Ankle plantarflexion    Ankle inversion    Ankle eversion     (Blank rows = not tested)  LOWER EXTREMITY MMT: NT due to acuity  MMT Right eval Left eval  Hip flexion    Hip extension    Hip abduction    Hip adduction    Hip internal rotation    Hip external rotation    Knee flexion    Knee extension    Ankle dorsiflexion    Ankle plantarflexion    Ankle inversion    Ankle eversion     (Blank rows = not tested)  LOWER EXTREMITY SPECIAL TESTS:  NT   FUNCTIONAL TESTS:  None on eval   GAIT: Distance walked: 75  Assistive device utilized:  knee immobilizer  Level of assistance: Modified independence Comments: WBAT no device leans to L with knee immobilizer                                                                                                                               TREATMENT DATE:  [facility] Adult PT Treatment:                                                DATE: 08/29/23 Therapeutic Activity: Supine ankle pump Prop leg for Quad set and activation , knee extension Heel slide AAROM seated Modalities: Cold pack 10 min while doing  LEFS  Self Care: Brace off when not walking, unwrap leg for skin  Try not to  prop under knee Cold pack Knee AROM, HEP Use 1-2 crutch when ambulating      PATIENT EDUCATION:  Education details: see above  Person educated: Patient Education method: Programmer, Multimedia, Demonstration, Verbal cues, and Handouts Education comprehension: verbalized understanding, returned demonstration, and needs further education  HOME EXERCISE PROGRAM: Access Code: [email or web address] URL: [email or web address] Date: 08/29/2023 Prepared by: Delon Norma  Exercises - Supine Quad Set  - 1 x daily - 7 x weekly - 2 sets - 10 reps - 5-10 hold - Seated Passive Knee Extension  - 1 x daily - 7 x weekly - 1 sets - 5 reps - 30 hold - Seated Knee Flexion AAROM  - 3-5 x daily - 7 x weekly - 1 sets - 10 reps - 15-20 hold - Supine Ankle Pumps  - 3-5 x daily - 7 x weekly - 2 sets - 10 reps  ASSESSMENT:  CLINICAL IMPRESSION: Patient is a 38 y.o. male who was seen today for physical therapy evaluation and treatment for Rt knee ACL recon with tib ant allograft.    OBJECTIVE IMPAIRMENTS: Abnormal gait, decreased activity tolerance, decreased knowledge of use of DME, decreased mobility, difficulty walking, decreased ROM, decreased strength, decreased safety awareness, increased edema, increased fascial restrictions, and pain.   ACTIVITY LIMITATIONS: carrying, lifting, bending, sitting, standing, squatting, sleeping, stairs, transfers, bed mobility, hygiene/grooming, locomotion level, and caring for others  PARTICIPATION LIMITATIONS: meal prep, cleaning, interpersonal relationship, driving, shopping, community activity, and occupation  PERSONAL FACTORS: 1-2 comorbidities: RA, shoulder pain   are also affecting patient's functional outcome.   REHAB POTENTIAL: Excellent  CLINICAL DECISION MAKING: Evolving/moderate complexity  EVALUATION COMPLEXITY: Moderate   GOALS: Goals reviewed with patient? Yes  SHORT TERM GOALS: Target date: 09/26/2023   Pt will be I with HEP for basic Rt knee ROM and  strength Baseline:given on eval  Goal status: INITIAL  2.  Pt will be able to tolerate seated flexion to > 75 deg for improved transfers  Baseline: limited due to pain and post surgical Goal status: INITIAL  3.  Pt will be able to perform SLR x 10 in order to progress to ambulation without brace Baseline: unable  Goal status: INITIAL    LONG TERM GOALS: Target date: 10/25/2023    Pt will be I with HEP for closed chain strength and stability, core.  Baseline: unknown  Goal status: INITIAL  2.  Pt will improve AROM to 90 deg in standing for functional strength in squat position (120 deg flexion by week 6)  Baseline: see above  Goal status: INITIAL  3.  Pt will achieve full extension in Rt knee for more normalized gait pattern Baseline: -10 deg  Goal status: INITIAL  4.  LEFS score will improve to 20/80 to work towards goals and full function Baseline: 6/80  Goal status: INITIAL  6.  Pt will ambulate without noticeable limp in the community > 500 feet Baseline: knee in KI, no crutches  Goal status: INITIAL   PLAN:  PT FREQUENCY: 2 x/week then 2-3 x per week after 4 weeks (8-12 week POC)   PT DURATION: 8 weeks  PLANNED INTERVENTIONS: 97164- PT Re-evaluation, 97110-Therapeutic exercises, 97530- Therapeutic activity, 97112- Neuromuscular re-education, 97535- Self Care, 02859- Manual therapy, [id number]- Gait training, [phone]- Aquatic Therapy, 97014- Electrical stimulation (unattended), 97016- Vasopneumatic device, Patient/Family education, Balance training, Stair training, Taping, Joint mobilization, Manual lymph drainage, Scar mobilization,  DME instructions, Cryotherapy, and Moist heat   PLAN FOR NEXT SESSION: ROM and protection , edema control, extension/prone hang. Pgress to full WB 0-45 deg . See [email or web address] for full protocol.    Burlon Centrella, PT 08/30/2023, 10:04 AM   Delon Norma, PT 08/30/23 10:04 AM Phone: [phone] Fax: [phone]    For all possible CPT  codes, reference the Planned Interventions line above.     Check all conditions that are expected to impact treatment: {Conditions expected to impact treatment:None of these apply   If treatment provided at initial evaluation, no treatment charged due to lack of authorization.    Treatment was provided on eval.

## 2023-08-29 ENCOUNTER — Ambulatory Visit: Payer: Medicaid Other | Attending: Orthopaedic Surgery | Admitting: Physical Therapy

## 2023-08-29 DIAGNOSIS — M25661 Stiffness of right knee, not elsewhere classified: Secondary | ICD-10-CM | POA: Diagnosis present

## 2023-08-29 DIAGNOSIS — R262 Difficulty in walking, not elsewhere classified: Secondary | ICD-10-CM

## 2023-08-29 DIAGNOSIS — Z4789 Encounter for other orthopedic aftercare: Secondary | ICD-10-CM | POA: Diagnosis present

## 2023-08-29 DIAGNOSIS — M25561 Pain in right knee: Secondary | ICD-10-CM | POA: Diagnosis present

## 2023-08-29 DIAGNOSIS — R600 Localized edema: Secondary | ICD-10-CM | POA: Diagnosis present

## 2023-09-04 ENCOUNTER — Encounter: Payer: Self-pay | Admitting: Physical Therapy

## 2023-09-04 ENCOUNTER — Ambulatory Visit: Payer: Medicaid Other | Admitting: Physical Therapy

## 2023-09-04 DIAGNOSIS — Z4789 Encounter for other orthopedic aftercare: Secondary | ICD-10-CM

## 2023-09-04 DIAGNOSIS — M25561 Pain in right knee: Secondary | ICD-10-CM

## 2023-09-04 NOTE — Therapy (Deleted)
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 phalanx   GERD (gastroesophageal reflux disease)    Inguinal hernia    Pinched nerve in neck    Rheumatoid arthritis [facility])    Past Surgical History:  Procedure Laterality Date   FINGER SURGERY     INGUINAL HERNIA REPAIR Left 10/07/2015   Procedure: LAPAROSCOPIC LEFT  INGUINAL HERNIA;  Surgeon: Axel Filler, MD;  Location: [facility];  Service: General;  Laterality: Left;   INSERTION OF MESH Left 10/07/2015   Procedure: INSERTION OF MESH;  Surgeon: Axel Filler, MD;  Location: [facility];  Service: General;  Laterality: Left;   Patient Active Problem List   Diagnosis Date Noted   Unspecified rotator cuff tear or rupture of right shoulder, not specified as traumatic 10/12/2022   Patellofemoral crepitus 10/12/2022   Seronegative rheumatoid arthritis (HCC) 03/28/2022   High risk medication use 03/28/2022   Educated about COVID-19 virus infection 08/06/2019   Precordial chest pain 06/20/2019   Nonspecific abnormal electrocardiogram (ECG) (EKG) 06/20/2019   Bilateral carpal tunnel syndrome 01/10/2018   Laryngopharyngeal reflux (LPR) 11/04/2017   Persistent hoarseness 11/04/2017    PCP: Evangeline Dakin PA  REFERRING PROVIDER: Ramond Marrow MD   REFERRING DIAG: Right knee arthroscopy with ACL reconstruction with allograft  1/30  THERAPY DIAG:  No diagnosis found.  Rationale for Evaluation and Treatment: Rehabilitation  ONSET DATE: 08/22/23  SUBJECTIVE:   SUBJECTIVE STATEMENT: Pt injured knee while playing football on 06/20/23. Reports that he was playing football when he  planted his foot and went to change directions which time he felt a pop and sudden pain in the right knee. Eventually he underwent ACL repair by Dr. Everardo Pacific,( Right knee arthroscopy with ACL reconstruction with allograft  1/30) and is seeing him today for f/u. He is WBAT. Using KI at all times.  Not working on ROM yet. He has a lot of pain. No red flags.  He has been seeing MD for shoulder pain but that has been put on hold.   PERTINENT HISTORY: RA, shoulder pain  Date of Surgery: 08/22/2023 Right knee arthroscopy with ACL reconstruction with allograft   PAIN:  Are you having pain? Yes: NPRS scale: 7/10 Pain location: R tknee A/P  Pain description: throbbing, sore Aggravating factors: dependent position, no knee support, moving it  Relieving factors: pillows, cold pack  PRECAUTIONS: Other: protocol  RED FLAGS: None   WEIGHT BEARING RESTRICTIONS: Yes WBAT  FALLS:  Has patient fallen in last 6 months? No  LIVING ENVIRONMENT: Lives with: lives alone Lives in: House/apartment Stairs: Yes: External: 5-6 steps; can reach both Has following equipment at home: Crutches  OCCUPATION: Was a [employer].    PLOF: Independent  PATIENT GOALS: I want to get back to my old self, active and pain free   NEXT MD VISIT: Today   OBJECTIVE:  Note: Objective measures were completed at Evaluation unless otherwise noted.  DIAGNOSTIC FINDINGS: not available MRI was reviewed and demonstrates an osteochondral lesion consistent with a bone bruise and an ACL tear.     PATIENT SURVEYS:  LEFS 6/80   COGNITION:  Overall cognitive status: Within functional limits for tasks assessed     SENSATION: WFL  EDEMA:  Circumferential: Rt 42 cm and Lt. 39 cm   MUSCLE LENGTH: NT   POSTURE: weight shift left guarded   PALPATION: Did not tolerate patellar mobilization , pain and tender to touch   LOWER EXTREMITY ROM:  Active ROM Right eval Left eval  Hip flexion    Hip extension    Hip abduction     Hip adduction    Hip internal rotation    Hip external rotation    Knee flexion 45 supine 50 seated    Knee extension -10    Ankle dorsiflexion    Ankle plantarflexion    Ankle inversion    Ankle eversion     (Blank rows = not tested)  LOWER EXTREMITY MMT: NT due to acuity  MMT Right eval Left eval  Hip flexion    Hip extension    Hip abduction    Hip adduction    Hip internal rotation    Hip external rotation    Knee flexion    Knee extension    Ankle dorsiflexion    Ankle plantarflexion    Ankle inversion    Ankle eversion     (Blank rows = not tested)  LOWER EXTREMITY SPECIAL TESTS:  NT   FUNCTIONAL TESTS:  None on eval   GAIT: Distance walked: 75  Assistive device utilized:  knee immobilizer  Level of assistance: Modified independence Comments: WBAT no device leans to L with knee immobilizer                                                                                                                               TREATMENT DATE:  [facility] Adult PT Treatment:                                                DATE: 08/29/23 Therapeutic Activity: Supine ankle pump Prop leg for Quad set and activation , knee extension Heel slide AAROM seated Modalities: Cold pack 10 min while doing LEFS  Self Care: Brace off when not walking, unwrap leg for skin  Try not to prop under knee Cold pack Knee AROM, HEP Use 1-2 crutch when ambulating      PATIENT EDUCATION:  Education details: see above  Person educated: Patient Education method: Programmer, multimedia, Facilities manager, Verbal cues, and Handouts Education comprehension: verbalized understanding, returned demonstration, and needs further education  HOME EXERCISE PROGRAM: Access Code: [email or web address] URL: [email or web address] Date: 08/29/2023 Prepared by: Karie Mainland  Exercises - Supine Quad Set  - 1 x daily - 7 x weekly - 2 sets - 10 reps - 5-10 hold - Seated Passive Knee Extension  - 1 x daily - 7 x weekly  - 1 sets - 5 reps - 30  hold - Seated Knee Flexion AAROM  - 3-5 x daily - 7 x weekly - 1 sets - 10 reps - 15-20 hold - Supine Ankle Pumps  - 3-5 x daily - 7 x weekly - 2 sets - 10 reps  ASSESSMENT:  CLINICAL IMPRESSION: Patient is a 38 y.o. male who was seen today for physical therapy evaluation and treatment for Rt knee ACL recon with tib ant allograft.    OBJECTIVE IMPAIRMENTS: Abnormal gait, decreased activity tolerance, decreased knowledge of use of DME, decreased mobility, difficulty walking, decreased ROM, decreased strength, decreased safety awareness, increased edema, increased fascial restrictions, and pain.   ACTIVITY LIMITATIONS: carrying, lifting, bending, sitting, standing, squatting, sleeping, stairs, transfers, bed mobility, hygiene/grooming, locomotion level, and caring for others  PARTICIPATION LIMITATIONS: meal prep, cleaning, interpersonal relationship, driving, shopping, community activity, and occupation  PERSONAL FACTORS: 1-2 comorbidities: RA, shoulder pain   are also affecting patient's functional outcome.   REHAB POTENTIAL: Excellent  CLINICAL DECISION MAKING: Evolving/moderate complexity  EVALUATION COMPLEXITY: Moderate   GOALS: Goals reviewed with patient? Yes  SHORT TERM GOALS: Target date: 09/26/2023   Pt will be I with HEP for basic Rt knee ROM and strength Baseline:given on eval  Goal status: INITIAL  2.  Pt will be able to tolerate seated flexion to > 75 deg for improved transfers  Baseline: limited due to pain and post surgical Goal status: INITIAL  3.  Pt will be able to perform SLR x 10 in order to progress to ambulation without brace Baseline: unable  Goal status: INITIAL    LONG TERM GOALS: Target date: 10/25/2023    Pt will be I with HEP for closed chain strength and stability, core.  Baseline: unknown  Goal status: INITIAL  2.  Pt will improve AROM to 90 deg in standing for functional strength in squat position (120 deg flexion by  week 6)  Baseline: see above  Goal status: INITIAL  3.  Pt will achieve full extension in Rt knee for more normalized gait pattern Baseline: -10 deg  Goal status: INITIAL  4.  LEFS score will improve to 20/80 to work towards goals and full function Baseline: 6/80  Goal status: INITIAL  6.  Pt will ambulate without noticeable limp in the community > 500 feet Baseline: knee in KI, no crutches  Goal status: INITIAL   PLAN:  PT FREQUENCY: 2 x/week then 2-3 x per week after 4 weeks (8-12 week POC)   PT DURATION: 8 weeks  PLANNED INTERVENTIONS: 97164- PT Re-evaluation, 97110-Therapeutic exercises, 97530- Therapeutic activity, [id number]- Neuromuscular re-education, 97535- Self Care, 96295- Manual therapy, [id number]- Gait training, [phone]- Aquatic Therapy, 97014- Electrical stimulation (unattended), 97016- Vasopneumatic device, Patient/Family education, Balance training, Stair training, Taping, Joint mobilization, Manual lymph drainage, Scar mobilization, DME instructions, Cryotherapy, and Moist heat   PLAN FOR NEXT SESSION: ROM and protection , edema control, extension/prone hang. Pgress to full WB 0-45 deg . See [email or web address] for full protocol.    Cynthie Garmon, PT 09/04/2023, 8:19 AM   Karie Mainland, PT 09/04/23 8:19 AM Phone: [phone] Fax: [phone]    For all possible CPT codes, reference the Planned Interventions line above.     Check all conditions that are expected to impact treatment: {Conditions expected to impact treatment:None of these apply   If treatment provided at initial evaluation, no treatment charged due to lack of authorization.    Treatment was provided on eval.

## 2023-09-04 NOTE — Therapy (Signed)
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 phalanx   GERD (gastroesophageal reflux disease)    Inguinal hernia    Pinched nerve in neck    Rheumatoid arthritis [facility])    Past Surgical History:  Procedure Laterality Date   FINGER SURGERY     INGUINAL HERNIA REPAIR Left 10/07/2015   Procedure: LAPAROSCOPIC LEFT  INGUINAL HERNIA;  Surgeon: Axel Filler, MD;  Location: [facility];  Service: General;  Laterality: Left;   INSERTION OF MESH Left 10/07/2015   Procedure: INSERTION OF MESH;  Surgeon: Axel Filler, MD;  Location: [facility];  Service: General;  Laterality: Left;   Patient Active Problem List   Diagnosis Date Noted   Unspecified rotator cuff tear or rupture of right shoulder, not specified as traumatic 10/12/2022   Patellofemoral crepitus 10/12/2022   Seronegative rheumatoid arthritis (HCC) 03/28/2022   High risk medication use 03/28/2022   Educated about COVID-19 virus infection 08/06/2019   Precordial chest pain 06/20/2019   Nonspecific abnormal electrocardiogram (ECG) (EKG) 06/20/2019   Bilateral carpal tunnel syndrome 01/10/2018   Laryngopharyngeal reflux (LPR) 11/04/2017   Persistent hoarseness 11/04/2017    PCP: Hyacinth Meeker, [place]  REFERRING PROVIDER: Ramond Marrow MD   REFERRING DIAG: Right knee arthroscopy with ACL reconstruction with allograft  1/30  THERAPY  DIAG:  Aftercare for anterior cruciate ligament (ACL) repair  Acute pain of right knee  Rationale for Evaluation and Treatment: Rehabilitation  ONSET DATE: 08/22/23  SUBJECTIVE:   SUBJECTIVE STATEMENT: I have a new brace. I have to go back to school today. I am not good at the crutches.   EVAL: Pt injured knee while playing football on 06/20/23. Reports that he was playing football when he planted his foot and went to change directions which time he felt a pop and sudden pain in the right knee. Eventually he underwent ACL repair by Dr. Everardo Pacific,( Right knee arthroscopy with ACL reconstruction with allograft  1/30) and is seeing him today for f/u. He is WBAT. Using KI at all times.  Not working on ROM yet. He has a lot of pain. No red flags.  He has been seeing MD for shoulder pain but that has been put on hold.   PERTINENT HISTORY: RA, shoulder pain  Date of Surgery: 08/22/2023 Right knee arthroscopy with ACL reconstruction with allograft   PAIN:  Are you having pain? Yes: NPRS scale: 4/10 Pain location: R tknee A/P  Pain description: throbbing, sore Aggravating factors: dependent position, no knee support, moving it  Relieving factors: pillows, cold pack  PRECAUTIONS: Other: protocol  RED FLAGS: None   WEIGHT BEARING RESTRICTIONS: Yes WBAT  FALLS:  Has patient fallen in last 6 months? No  LIVING  ENVIRONMENT: Lives with: lives alone Lives in: House/apartment Stairs: Yes: External: 5-6 steps; can reach both Has following equipment at home: Crutches  OCCUPATION: Was a [employer].    PLOF: Independent  PATIENT GOALS: I want to get back to my old self, active and pain free   NEXT MD VISIT: Today   OBJECTIVE:  Note: Objective measures were completed at Evaluation unless otherwise noted.  DIAGNOSTIC FINDINGS: not available MRI was reviewed and demonstrates an osteochondral lesion consistent with a bone bruise and an ACL tear.     PATIENT SURVEYS:  LEFS 6/80    COGNITION: Overall cognitive status: Within functional limits for tasks assessed     SENSATION: WFL  EDEMA:  Circumferential: Rt 42 cm and Lt. 39 cm   MUSCLE LENGTH: NT   POSTURE: weight shift left guarded   PALPATION: Did not tolerate patellar mobilization , pain and tender to touch   LOWER EXTREMITY ROM:  Active ROM Right eval Left eval Right 09/04/23  Hip flexion     Hip extension     Hip abduction     Hip adduction     Hip internal rotation     Hip external rotation     Knee flexion 45 supine 50 seated   100 supine A 115 supine AA  Knee extension -10   -5   Ankle dorsiflexion     Ankle plantarflexion     Ankle inversion     Ankle eversion      (Blank rows = not tested)  LOWER EXTREMITY MMT: NT due to acuity  MMT Right eval Left eval  Hip flexion    Hip extension    Hip abduction    Hip adduction    Hip internal rotation    Hip external rotation    Knee flexion    Knee extension    Ankle dorsiflexion    Ankle plantarflexion    Ankle inversion    Ankle eversion     (Blank rows = not tested)  LOWER EXTREMITY SPECIAL TESTS:  NT   FUNCTIONAL TESTS:  None on eval   GAIT: Distance walked: 75  Assistive device utilized:  knee immobilizer  Level of assistance: Modified independence Comments: WBAT no device leans to L with knee immobilizer                                                                                                                               TREATMENT DATE:  [facility] Adult PT Treatment:                                                DATE: 09/04/23 Therapeutic Exercise: Prone knee hang 1 minute  SLR right x 8  Neuromuscular re-ed: Quad Set with heel prop tactile and verbal cues  Therapeutic Activity: AAROM heel  slide> AROM heel slide to promote knee flexion for functional activities  Mini squat 45 degrees to promote knee flexion for functional activities Modalities: Vaso mod pressure, 34 degrees x 15 minutes ,  elevated Self Care: Patella mobs -demonstration and  self instruction  Pt education -see section  [facility] Adult PT Treatment:                                                DATE: 08/29/23 Therapeutic Activity: Supine ankle pump Prop leg for Quad set and activation , knee extension Heel slide AAROM seated Modalities: Cold pack 10 min while doing LEFS  Self Care: Brace off when not walking, unwrap leg for skin  Try not to prop under knee Cold pack Knee AROM, HEP Use 1-2 crutch when ambulating      PATIENT EDUCATION:  Education details: importance of edema management, avoid dependent position prolonged (school) , slow progress with return to walking  Person educated: Patient Education method: Explanation, Demonstration, Verbal cues, and Handouts Education comprehension: verbalized understanding, returned demonstration, and needs further education  HOME EXERCISE PROGRAM: Access Code: [email or web address] URL: [email or web address] Date: 08/29/2023 Prepared by: Karie Mainland  Exercises - Supine Quad Set  - 1 x daily - 7 x weekly - 2 sets - 10 reps - 5-10 hold - Seated Passive Knee Extension  - 1 x daily - 7 x weekly - 1 sets - 5 reps - 30 hold - Seated Knee Flexion AAROM  - 3-5 x daily - 7 x weekly - 1 sets - 10 reps - 15-20 hold - Supine Ankle Pumps  - 3-5 x daily - 7 x weekly - 2 sets - 10 reps  ASSESSMENT:  CLINICAL IMPRESSION: Pt arrives reproting that MD removed fluid from knee and gave him a velcro knee brace.He arrives without AD. His AROM has significantly improved for flexion and he lacks a few degrees of knee extension. Verbal and tactile cues required for correct execution of quad set. Able to perform several SLR reps without lag but with knee discomfort reported. Instructed pt in prone knee hang and began 45 degree mini squats with discomfort reported. Vasopneumatic device applied at end of session for edema control.    EVAL: Patient is a 38 y.o. male who was seen today  for physical therapy evaluation and treatment for Rt knee ACL recon with tib ant allograft.    OBJECTIVE IMPAIRMENTS: Abnormal gait, decreased activity tolerance, decreased knowledge of use of DME, decreased mobility, difficulty walking, decreased ROM, decreased strength, decreased safety awareness, increased edema, increased fascial restrictions, and pain.   ACTIVITY LIMITATIONS: carrying, lifting, bending, sitting, standing, squatting, sleeping, stairs, transfers, bed mobility, hygiene/grooming, locomotion level, and caring for others  PARTICIPATION LIMITATIONS: meal prep, cleaning, interpersonal relationship, driving, shopping, community activity, and occupation  PERSONAL FACTORS: 1-2 comorbidities: RA, shoulder pain   are also affecting patient's functional outcome.   REHAB POTENTIAL: Excellent  CLINICAL DECISION MAKING: Evolving/moderate complexity  EVALUATION COMPLEXITY: Moderate   GOALS: Goals reviewed with patient? Yes  SHORT TERM GOALS: Target date: 09/26/2023   Pt will be I with HEP for basic Rt knee ROM and strength Baseline:given on eval  Goal status: INITIAL  2.  Pt will be able to tolerate seated flexion to > 75 deg for improved transfers  Baseline: limited due to pain and post surgical 09/04/23: >90  Goal status:  MET  3.  Pt will be able to perform SLR x 10 in order to progress to ambulation without brace Baseline: unable  09/04/23: 8 reps with rest Goal status: ONGOING    LONG TERM GOALS: Target date: 10/25/2023    Pt will be I with HEP for closed chain strength and stability, core.  Baseline: unknown  Goal status: INITIAL  2.  Pt will improve AROM to 90 deg in standing for functional strength in squat position (120 deg flexion by week 6)  Baseline: see above  Goal status: INITIAL  3.  Pt will achieve full extension in Rt knee for more normalized gait pattern Baseline: -10 deg  Goal status: INITIAL  4.  LEFS score will improve to 20/80 to work towards  goals and full function Baseline: 6/80  Goal status: INITIAL  6.  Pt will ambulate without noticeable limp in the community > 500 feet Baseline: knee in KI, no crutches  Goal status: INITIAL   PLAN:  PT FREQUENCY: 2 x/week then 2-3 x per week after 4 weeks (8-12 week POC)   PT DURATION: 8 weeks  PLANNED INTERVENTIONS: 97164- PT Re-evaluation, 97110-Therapeutic exercises, 97530- Therapeutic activity, [id number]- Neuromuscular re-education, 97535- Self Care, 16109- Manual therapy, [id number]- Gait training, [phone]- Aquatic Therapy, 97014- Electrical stimulation (unattended), 97016- Vasopneumatic device, Patient/Family education, Balance training, Stair training, Taping, Joint mobilization, Manual lymph drainage, Scar mobilization, DME instructions, Cryotherapy, and Moist heat   PLAN FOR NEXT SESSION: ROM and protection , edema control, extension/prone hang. Pgress to full WB 0-45 deg . See [email or web address] for full protocol.    Jannette Spanner, PTA 09/04/23 11:23 AM Phone: [phone] Fax: [phone]    For all possible CPT codes, reference the Planned Interventions line above.     Check all conditions that are expected to impact treatment: {Conditions expected to impact treatment:None of these apply   If treatment provided at initial evaluation, no treatment charged due to lack of authorization.    Treatment was provided on eval.

## 2023-09-06 ENCOUNTER — Ambulatory Visit: Payer: Medicaid Other | Admitting: Physical Therapy

## 2023-09-06 ENCOUNTER — Encounter: Payer: Self-pay | Admitting: Physical Therapy

## 2023-09-06 DIAGNOSIS — M25661 Stiffness of right knee, not elsewhere classified: Secondary | ICD-10-CM

## 2023-09-06 DIAGNOSIS — R262 Difficulty in walking, not elsewhere classified: Secondary | ICD-10-CM

## 2023-09-06 DIAGNOSIS — Z4789 Encounter for other orthopedic aftercare: Secondary | ICD-10-CM | POA: Diagnosis not present

## 2023-09-06 DIAGNOSIS — R600 Localized edema: Secondary | ICD-10-CM

## 2023-09-06 DIAGNOSIS — M25561 Pain in right knee: Secondary | ICD-10-CM

## 2023-09-06 NOTE — Therapy (Signed)
OUTPATIENT PHYSICAL THERAPY LOWER EXTREMITY TREATMENT   Patient Name: Steve Flores MRN: [id number] DOB:[birth year], 38 y.o., male Today's Date: 09/06/2023  END OF SESSION:  PT End of Session - 09/06/23 0812     Visit Number 3    Number of Visits 16    Date for PT Re-Evaluation 10/25/23    Authorization Type [employer]    PT Start Time 0808    PT Stop Time 0905    PT Time Calculation (min) 57 min    Equipment Utilized During Treatment Right knee immobilizer    Activity Tolerance Patient tolerated treatment well    Behavior During Therapy Anxious;WFL for tasks assessed/performed               Past Medical History:  Diagnosis Date   Abrasion of finger of right hand 12/28/2011   healing wound right index finger   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 phalanx   GERD (gastroesophageal reflux disease)    Inguinal hernia    Pinched nerve in neck    Rheumatoid arthritis [facility])    Past Surgical History:  Procedure Laterality Date   FINGER SURGERY     INGUINAL HERNIA REPAIR Left 10/07/2015   Procedure: LAPAROSCOPIC LEFT  INGUINAL HERNIA;  Surgeon: Axel Filler, MD;  Location: [facility];  Service: General;  Laterality: Left;   INSERTION OF MESH Left 10/07/2015   Procedure: INSERTION OF MESH;  Surgeon: Axel Filler, MD;  Location: [facility];  Service: General;  Laterality: Left;   Patient Active Problem List   Diagnosis Date Noted   Unspecified rotator cuff tear or rupture of right shoulder, not specified as traumatic 10/12/2022   Patellofemoral crepitus 10/12/2022   Seronegative rheumatoid arthritis (HCC) 03/28/2022   High risk medication use 03/28/2022   Educated about COVID-19 virus infection 08/06/2019   Precordial chest pain 06/20/2019   Nonspecific abnormal electrocardiogram (ECG) (EKG) 06/20/2019   Bilateral carpal tunnel syndrome 01/10/2018   Laryngopharyngeal reflux (LPR) 11/04/2017   Persistent  hoarseness 11/04/2017    PCP: Evangeline Dakin PA  REFERRING PROVIDER: Ramond Marrow MD   REFERRING DIAG: Right knee arthroscopy with ACL reconstruction with allograft  1/30  THERAPY DIAG:  Aftercare for anterior cruciate ligament (ACL) repair  Acute pain of right knee  Localized edema  Stiffness of right knee, not elsewhere classified  Difficulty in walking, not elsewhere classified  Rationale for Evaluation and Treatment: Rehabilitation  ONSET DATE: 08/22/23  SUBJECTIVE:   SUBJECTIVE STATEMENT: I got some accomodations at school.  He has pain under the knee cap with exercises.  Sleeps with compression/ice .   EVAL: Pt injured knee while playing football on 06/20/23. Reports that he was playing football when he planted his foot and went to change directions which time he felt a pop and sudden pain in the right knee. Eventually he underwent ACL repair by Dr. Everardo Pacific,( Right knee arthroscopy with ACL reconstruction with allograft  1/30) and is seeing him today for f/u. He is WBAT. Using KI at all times.  Not working on ROM yet. He has a lot of pain. No red flags.  He has been seeing MD for shoulder pain but that has been put on hold.   PERTINENT HISTORY: RA, shoulder pain  Date of Surgery: 08/22/2023 Right knee arthroscopy with ACL reconstruction with allograft   PAIN:  Are you having pain? Yes: NPRS scale: 4/10 Pain location: R tknee  A/P  Pain description: throbbing, sore Aggravating factors: dependent position, no knee support, moving it  Relieving factors: pillows, cold pack  PRECAUTIONS: Other: protocol  RED FLAGS: None   WEIGHT BEARING RESTRICTIONS: Yes WBAT  FALLS:  Has patient fallen in last 6 months? No  LIVING ENVIRONMENT: Lives with: lives alone Lives in: House/apartment Stairs: Yes: External: 5-6 steps; can reach both Has following equipment at home: Crutches  OCCUPATION: Was a [employer].    PLOF: Independent  PATIENT GOALS: I want to get back to my  old self, active and pain free   NEXT MD VISIT: Today   OBJECTIVE:  Note: Objective measures were completed at Evaluation unless otherwise noted.  DIAGNOSTIC FINDINGS: not available MRI was reviewed and demonstrates an osteochondral lesion consistent with a bone bruise and an ACL tear.     PATIENT SURVEYS:  LEFS 6/80   COGNITION: Overall cognitive status: Within functional limits for tasks assessed     SENSATION: WFL  EDEMA:  Circumferential: Rt 42 cm and Lt. 39 cm   MUSCLE LENGTH: NT   POSTURE: weight shift left guarded   PALPATION: Did not tolerate patellar mobilization , pain and tender to touch   LOWER EXTREMITY ROM:  Active ROM Right eval Left eval Right 09/04/23  Hip flexion     Hip extension     Hip abduction     Hip adduction     Hip internal rotation     Hip external rotation     Knee flexion 45 supine 50 seated   100 supine A 115 supine AA  Knee extension -10   -5   Ankle dorsiflexion     Ankle plantarflexion     Ankle inversion     Ankle eversion      (Blank rows = not tested)  LOWER EXTREMITY MMT: NT due to acuity  MMT Right eval Left eval  Hip flexion    Hip extension    Hip abduction    Hip adduction    Hip internal rotation    Hip external rotation    Knee flexion    Knee extension    Ankle dorsiflexion    Ankle plantarflexion    Ankle inversion    Ankle eversion     (Blank rows = not tested)  LOWER EXTREMITY SPECIAL TESTS:  NT   FUNCTIONAL TESTS:  None on eval   GAIT: Distance walked: 75  Assistive device utilized:  knee immobilizer  Level of assistance: Modified independence Comments: WBAT no device leans to L with knee immobilizer                                                                                                                               TREATMENT DATE:   [facility] Adult PT Treatment:  DATE: 09/06/23 Therapeutic Activity: Quad set x 10  SLR 2 x 10   Hamstring 3 x 30 sec  Knee flexion AAROM sliding board x 10, 120 deg Bridging double leg x 15 , partial ROM for hamstring  x 10  Partial bridge with increased WB on Rt LE  x 10  Sit to stand  x 10 Wall sit at 45 deg 3 x 30 sec   NuStep L5 UE and LE for 5 min  Self care:  ROM limits in standing Gait with and without brace, WBAT Daily HEP and ROM   Modalities: Vaso mod pressure, 34 degrees x 15 minutes , elevated    [facility] Adult PT Treatment:                                                DATE: 09/04/23 Therapeutic Exercise: Prone knee hang 1 minute  SLR right x 8  Neuromuscular re-ed: Quad Set with heel prop tactile and verbal cues  Therapeutic Activity: AAROM heel slide> AROM heel slide to promote knee flexion for functional activities  Mini squat 45 degrees to promote knee flexion for functional activities Modalities: Vaso mod pressure, 34 degrees x 15 minutes , elevated Self Care: Patella mobs -demonstration and  self instruction  Pt education -see section  [facility] Adult PT Treatment:                                                DATE: 08/29/23 Therapeutic Activity: Supine ankle pump Prop leg for Quad set and activation , knee extension Heel slide AAROM seated Modalities: Cold pack 10 min while doing LEFS  Self Care: Brace off when not walking, unwrap leg for skin  Try not to prop under knee Cold pack Knee AROM, HEP Use 1-2 crutch when ambulating      PATIENT EDUCATION:  Education details: importance of edema management, avoid dependent position prolonged (school) , slow progress with return to walking  Person educated: Patient Education method: Explanation, Demonstration, Verbal cues, and Handouts Education comprehension: verbalized understanding, returned demonstration, and needs further education  HOME EXERCISE PROGRAM: Access Code: [email or web address] URL: [email or web address] Date: 08/29/2023 Prepared by: Karie Mainland  Exercises - Supine Quad Set  - 1  x daily - 7 x weekly - 2 sets - 10 reps - 5-10 hold - Seated Passive Knee Extension  - 1 x daily - 7 x weekly - 1 sets - 5 reps - 30 hold - Seated Knee Flexion AAROM  - 3-5 x daily - 7 x weekly - 1 sets - 10 reps - 15-20 hold - Supine Ankle Pumps  - 3-5 x daily - 7 x weekly - 2 sets - 10 reps  ASSESSMENT:  CLINICAL IMPRESSION: Patient continues to make excellent progress. Therapy worked on improving quad activation, knee ROM and standing weight-bearing within 45 deg. SLR is good but with increased pain. Pt was able to walk without his brace across the gym but with a limp. Cont to work within POC to ensure maximum therapeutic benefit.    EVAL: Patient is a 38 y.o. male who was seen today for physical therapy evaluation and treatment for Rt knee ACL recon with tib ant allograft.    OBJECTIVE IMPAIRMENTS: Abnormal  gait, decreased activity tolerance, decreased knowledge of use of DME, decreased mobility, difficulty walking, decreased ROM, decreased strength, decreased safety awareness, increased edema, increased fascial restrictions, and pain.   ACTIVITY LIMITATIONS: carrying, lifting, bending, sitting, standing, squatting, sleeping, stairs, transfers, bed mobility, hygiene/grooming, locomotion level, and caring for others  PARTICIPATION LIMITATIONS: meal prep, cleaning, interpersonal relationship, driving, shopping, community activity, and occupation  PERSONAL FACTORS: 1-2 comorbidities: RA, shoulder pain   are also affecting patient's functional outcome.   REHAB POTENTIAL: Excellent  CLINICAL DECISION MAKING: Evolving/moderate complexity  EVALUATION COMPLEXITY: Moderate   GOALS: Goals reviewed with patient? Yes  SHORT TERM GOALS: Target date: 09/26/2023   Pt will be I with HEP for basic Rt knee ROM and strength Baseline:given on eval  Goal status: INITIAL  2.  Pt will be able to tolerate seated flexion to > 75 deg for improved transfers  Baseline: limited due to pain and post  surgical 09/04/23: >90  Goal status: MET  3.  Pt will be able to perform SLR x 10 in order to progress to ambulation without brace Baseline: unable  09/04/23: 8 reps with rest Goal status: ONGOING    LONG TERM GOALS: Target date: 10/25/2023    Pt will be I with HEP for closed chain strength and stability, core.  Baseline: unknown  Goal status: INITIAL  2.  Pt will improve AROM to 90 deg in standing for functional strength in squat position (120 deg flexion by week 6)  Baseline: see above  Goal status: INITIAL  3.  Pt will achieve full extension in Rt knee for more normalized gait pattern Baseline: -10 deg  Goal status: INITIAL  4.  LEFS score will improve to 20/80 to work towards goals and full function Baseline: 6/80  Goal status: INITIAL  6.  Pt will ambulate without noticeable limp in the community > 500 feet Baseline: knee in KI, no crutches  Goal status: INITIAL   PLAN:  PT FREQUENCY: 2 x/week then 2-3 x per week after 4 weeks (8-12 week POC)   PT DURATION: 8 weeks  PLANNED INTERVENTIONS: 97164- PT Re-evaluation, 97110-Therapeutic exercises, 97530- Therapeutic activity, [id number]- Neuromuscular re-education, 97535- Self Care, 82956- Manual therapy, [id number]- Gait training, [phone]- Aquatic Therapy, 97014- Electrical stimulation (unattended), 97016- Vasopneumatic device, Patient/Family education, Balance training, Stair training, Taping, Joint mobilization, Manual lymph drainage, Scar mobilization, DME instructions, Cryotherapy, and Moist heat   PLAN FOR NEXT SESSION: ROM and protection , edema control, extension/prone hang. Pgress to full WB 0-45 deg . See [email or web address] for full protocol.   Karie Mainland, PT 09/06/23 8:59 AM Phone: [phone] Fax: [phone]    For all possible CPT codes, reference the Planned Interventions line above.     Check all conditions that are expected to impact treatment: {Conditions expected to impact treatment:None of these apply   If  treatment provided at initial evaluation, no treatment charged due to lack of authorization.    Treatment was provided on eval.   Karie Mainland, PT 09/06/23 8:59 AM Phone: [phone] Fax: [phone]

## 2023-09-09 ENCOUNTER — Ambulatory Visit: Payer: Medicaid Other | Admitting: Physical Therapy

## 2023-09-10 ENCOUNTER — Encounter: Payer: Medicaid Other | Admitting: Physical Therapy

## 2023-09-11 ENCOUNTER — Ambulatory Visit: Payer: Medicaid Other | Admitting: Physical Therapy

## 2023-09-13 ENCOUNTER — Encounter: Payer: Self-pay | Admitting: Physical Therapy

## 2023-09-13 ENCOUNTER — Ambulatory Visit: Payer: Medicaid Other | Admitting: Physical Therapy

## 2023-09-13 DIAGNOSIS — Z4789 Encounter for other orthopedic aftercare: Secondary | ICD-10-CM

## 2023-09-13 DIAGNOSIS — M25561 Pain in right knee: Secondary | ICD-10-CM

## 2023-09-13 NOTE — Therapy (Signed)
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 phalanx   GERD (gastroesophageal reflux disease)    Inguinal hernia    Pinched nerve in neck    Rheumatoid arthritis [facility])    Past Surgical History:  Procedure Laterality Date   FINGER SURGERY     INGUINAL HERNIA REPAIR Left 10/07/2015   Procedure: LAPAROSCOPIC LEFT  INGUINAL HERNIA;  Surgeon: Axel Filler, MD;  Location: [facility];  Service: General;  Laterality: Left;   INSERTION OF MESH Left 10/07/2015   Procedure: INSERTION OF MESH;  Surgeon: Axel Filler, MD;  Location: [facility];  Service: General;  Laterality: Left;   Patient Active Problem List   Diagnosis Date Noted   Unspecified rotator cuff tear or rupture of right shoulder, not specified as traumatic 10/12/2022   Patellofemoral crepitus 10/12/2022   Seronegative rheumatoid arthritis (HCC) 03/28/2022   High risk medication use 03/28/2022   Educated about COVID-19 virus infection 08/06/2019   Precordial chest pain 06/20/2019   Nonspecific abnormal electrocardiogram (ECG) (EKG) 06/20/2019   Bilateral carpal tunnel syndrome 01/10/2018   Laryngopharyngeal reflux (LPR) 11/04/2017   Persistent hoarseness 11/04/2017    PCP: Evangeline Dakin PA  REFERRING PROVIDER: Ramond Marrow MD   REFERRING DIAG: Right knee arthroscopy with ACL reconstruction with allograft  1/30  THERAPY  DIAG:  Aftercare for anterior cruciate ligament (ACL) repair  Acute pain of right knee  Rationale for Evaluation and Treatment: Rehabilitation  ONSET DATE: 08/22/23  SUBJECTIVE:   SUBJECTIVE STATEMENT: Knee still get so swollen throughout the day.   EVAL: Pt injured knee while playing football on 06/20/23. Reports that he was playing football when he planted his foot and went to change directions which time he felt a pop and sudden pain in the right knee. Eventually he underwent ACL repair by Dr. Everardo Pacific,( Right knee arthroscopy with ACL reconstruction with allograft  1/30) and is seeing him today for f/u. He is WBAT. Using KI at all times.  Not working on ROM yet. He has a lot of pain. No red flags.  He has been seeing MD for shoulder pain but that has been put on hold.   PERTINENT HISTORY: RA, shoulder pain  Date of Surgery: 08/22/2023 Right knee arthroscopy with ACL reconstruction with allograft   PAIN:  Are you having pain? Yes: NPRS scale: 4/10 Pain location: R tknee A/P  Pain description: throbbing, sore Aggravating factors: dependent position, no knee support, moving it  Relieving factors: pillows, cold pack  PRECAUTIONS: Other: protocol  RED FLAGS: None   WEIGHT BEARING RESTRICTIONS: Yes WBAT  FALLS:  Has patient fallen in last 6 months? No  LIVING ENVIRONMENT: Lives with: lives alone Lives in: House/apartment Stairs: Yes: External:  5-6 steps; can reach both Has following equipment at home: Crutches  OCCUPATION: Was a [employer].    PLOF: Independent  PATIENT GOALS: I want to get back to my old self, active and pain free   NEXT MD VISIT: Today   OBJECTIVE:  Note: Objective measures were completed at Evaluation unless otherwise noted.  DIAGNOSTIC FINDINGS: not available MRI was reviewed and demonstrates an osteochondral lesion consistent with a bone bruise and an ACL tear.     PATIENT SURVEYS:  LEFS 6/80   COGNITION: Overall cognitive status: Within  functional limits for tasks assessed     SENSATION: WFL  EDEMA:  Circumferential: Rt 42 cm and Lt. 39 cm   MUSCLE LENGTH: NT   POSTURE: weight shift left guarded   PALPATION: Did not tolerate patellar mobilization , pain and tender to touch   LOWER EXTREMITY ROM:  Active ROM Right eval Left eval Right 09/04/23 Right 09/13/23  Hip flexion      Hip extension      Hip abduction      Hip adduction      Hip internal rotation      Hip external rotation      Knee flexion 45 supine 50 seated   100 supine A 115 supine AA 125 A  Knee extension -10   -5  -3  Ankle dorsiflexion      Ankle plantarflexion      Ankle inversion      Ankle eversion       (Blank rows = not tested)  LOWER EXTREMITY MMT: NT due to acuity  MMT Right eval Left eval  Hip flexion    Hip extension    Hip abduction    Hip adduction    Hip internal rotation    Hip external rotation    Knee flexion    Knee extension    Ankle dorsiflexion    Ankle plantarflexion    Ankle inversion    Ankle eversion     (Blank rows = not tested)  LOWER EXTREMITY SPECIAL TESTS:  NT   FUNCTIONAL TESTS:  None on eval   GAIT: Distance walked: 75  Assistive device utilized:  knee immobilizer  Level of assistance: Modified independence Comments: WBAT no device leans to L with knee immobilizer                                                                                                                               TREATMENT DATE:  [facility] Adult PT Treatment:                                                DATE: 09/13/23 SLR  QS Hip abdct  Supine h/c curl with feet on ball  Supine heel slide  Bridge  H/s stretch with strap Prone  knee hand 2# x 60 sec  Wall squat to 45 degrees 5 x 2   Modalities: Vaso mod pressure, 34 degrees x 15 minutes , elevated    [facility] Adult PT Treatment:                                                DATE: 09/06/23 Therapeutic Activity: Quad set x 10  SLR 2 x 10  Hamstring 3 x  30 sec  Knee flexion AAROM sliding board x 10, 120 deg Bridging double leg x 15 , partial ROM for hamstring  x 10  Partial bridge with increased WB on Rt LE  x 10  Sit to stand  x 10 Wall sit at 45 deg 3 x 30 sec   NuStep L5 UE and LE for 5 min  Self care:  ROM limits in standing Gait with and without brace, WBAT Daily HEP and ROM   Modalities: Vaso mod pressure, 34 degrees x 15 minutes , elevated    [facility] Adult PT Treatment:                                                DATE: 09/04/23 Therapeutic Exercise: Prone knee hang 1 minute  SLR right x 8  Neuromuscular re-ed: Quad Set with heel prop tactile and verbal cues  Therapeutic Activity: AAROM heel slide> AROM heel slide to promote knee flexion for functional activities  Mini squat 45 degrees to promote knee flexion for functional activities Modalities: Vaso mod pressure, 34 degrees x 15 minutes , elevated Self Care: Patella mobs -demonstration and  self instruction  Pt education -see section  [facility] Adult PT Treatment:                                                DATE: 08/29/23 Therapeutic Activity: Supine ankle pump Prop leg for Quad set and activation , knee extension Heel slide AAROM seated Modalities: Cold pack 10 min while doing LEFS  Self Care: Brace off when not walking, unwrap leg for skin  Try not to prop under knee Cold pack Knee AROM, HEP Use 1-2 crutch when ambulating      PATIENT EDUCATION:  Education details: importance of edema management, avoid dependent position prolonged (school) , slow progress with return to walking  Person educated: Patient Education method: Explanation, Demonstration, Verbal cues, and Handouts Education comprehension: verbalized understanding, returned demonstration, and needs further education  HOME EXERCISE PROGRAM: Access Code: [email or web address] URL: [email or web address] Date: 08/29/2023 Prepared by: Karie Mainland  Exercises - Supine Quad Set  - 1 x daily - 7 x  weekly - 2 sets - 10 reps - 5-10 hold - Seated Passive Knee Extension  - 1 x daily - 7 x weekly - 1 sets - 5 reps - 30 hold - Seated Knee Flexion AAROM  - 3-5 x daily - 7 x weekly - 1 sets - 10 reps - 15-20 hold - Supine Ankle Pumps  - 3-5 x daily - 7 x weekly - 2 sets - 10 reps  ASSESSMENT:  CLINICAL IMPRESSION: Patient continues to make excellent  progress reaching 125 degrees knee flexion today. . Therapy worked on improving quad activation, knee ROM and standing weight-bearing within 45 deg. SLR is good but with increased pain.  Cont to work within POC to ensure maximum therapeutic benefit.    EVAL: Patient is a 38 y.o. male who was seen today for physical therapy evaluation and treatment for Rt knee ACL recon with tib ant allograft.    OBJECTIVE IMPAIRMENTS: Abnormal gait, decreased activity tolerance, decreased knowledge of use of DME, decreased mobility, difficulty walking, decreased ROM, decreased strength, decreased safety awareness, increased edema, increased fascial restrictions, and pain.   ACTIVITY LIMITATIONS: carrying, lifting, bending, sitting, standing, squatting, sleeping, stairs, transfers, bed mobility, hygiene/grooming, locomotion level, and caring for others  PARTICIPATION LIMITATIONS: meal prep, cleaning, interpersonal relationship, driving, shopping, community activity, and occupation  PERSONAL FACTORS: 1-2 comorbidities: RA, shoulder pain   are also affecting patient's functional outcome.   REHAB POTENTIAL: Excellent  CLINICAL DECISION MAKING: Evolving/moderate complexity  EVALUATION COMPLEXITY: Moderate   GOALS: Goals reviewed with patient? Yes  SHORT TERM GOALS: Target date: 09/26/2023   Pt will be I with HEP for basic Rt knee ROM and strength Baseline:given on eval  Goal status: INITIAL  2.  Pt will be able to tolerate seated flexion to > 75 deg for improved transfers  Baseline: limited due to pain and post surgical 09/04/23: >90  Goal status: MET  3.   Pt will be able to perform SLR x 10 in order to progress to ambulation without brace Baseline: unable  09/04/23: 8 reps with rest Goal status: ONGOING    LONG TERM GOALS: Target date: 10/25/2023    Pt will be I with HEP for closed chain strength and stability, core.  Baseline: unknown  Goal status: INITIAL  2.  Pt will improve AROM to 90 deg in standing for functional strength in squat position (120 deg flexion by week 6)  Baseline: see above  Goal status: INITIAL  3.  Pt will achieve full extension in Rt knee for more normalized gait pattern Baseline: -10 deg  Goal status: INITIAL  4.  LEFS score will improve to 20/80 to work towards goals and full function Baseline: 6/80  Goal status: INITIAL  6.  Pt will ambulate without noticeable limp in the community > 500 feet Baseline: knee in KI, no crutches  Goal status: INITIAL   PLAN:  PT FREQUENCY: 2 x/week then 2-3 x per week after 4 weeks (8-12 week POC)   PT DURATION: 8 weeks  PLANNED INTERVENTIONS: 97164- PT Re-evaluation, 97110-Therapeutic exercises, 97530- Therapeutic activity, [id number]- Neuromuscular re-education, 97535- Self Care, 21308- Manual therapy, [id number]- Gait training, [phone]- Aquatic Therapy, 97014- Electrical stimulation (unattended), 97016- Vasopneumatic device, Patient/Family education, Balance training, Stair training, Taping, Joint mobilization, Manual lymph drainage, Scar mobilization, DME instructions, Cryotherapy, and Moist heat   PLAN FOR NEXT SESSION: ROM and protection , edema control, extension/prone hang. Pgress to full WB 0-45 deg . See [email or web address] for full protocol.   Jannette Spanner, PTA 09/13/23 12:17 PM Phone: [phone] Fax: [phone]    For all possible CPT codes, reference the Planned Interventions line above.     Check all conditions that are expected to impact treatment: {Conditions expected to impact treatment:None of these apply   If treatment provided at initial evaluation, no  treatment charged due to lack of authorization.    Treatment was provided on eval.

## 2023-09-16 ENCOUNTER — Ambulatory Visit: Payer: Medicaid Other | Admitting: Physical Therapy

## 2023-09-16 ENCOUNTER — Telehealth: Payer: Self-pay | Admitting: Physical Therapy

## 2023-09-16 NOTE — Telephone Encounter (Signed)
 Left voicemail regarding missed appointment. Left rescheduling options and left next appointment date and time.

## 2023-09-18 ENCOUNTER — Ambulatory Visit: Payer: Medicaid Other | Admitting: Physical Therapy

## 2023-09-18 ENCOUNTER — Encounter: Payer: Self-pay | Admitting: Physical Therapy

## 2023-09-18 DIAGNOSIS — M25561 Pain in right knee: Secondary | ICD-10-CM

## 2023-09-18 DIAGNOSIS — Z4789 Encounter for other orthopedic aftercare: Secondary | ICD-10-CM | POA: Diagnosis not present

## 2023-09-18 DIAGNOSIS — R262 Difficulty in walking, not elsewhere classified: Secondary | ICD-10-CM

## 2023-09-18 DIAGNOSIS — R600 Localized edema: Secondary | ICD-10-CM

## 2023-09-18 DIAGNOSIS — M25661 Stiffness of right knee, not elsewhere classified: Secondary | ICD-10-CM

## 2023-09-18 NOTE — Therapy (Signed)
 OUTPATIENT PHYSICAL THERAPY LOWER EXTREMITY TREATMENT  Patient Name: Steve Flores MRN: [id number] DOB:1986-04-14, 38 y.o., male Today's Date: 09/18/2023  END OF SESSION:  PT End of Session - 09/18/23 1026     Visit Number 5    Number of Visits 16    Date for PT Re-Evaluation 10/25/23    Authorization Type [facility]    PT Start Time 1024    PT Stop Time 1115    PT Time Calculation (min) 51 min    Equipment Utilized During Treatment Right knee immobilizer    Activity Tolerance Patient tolerated treatment well    Behavior During Therapy Anxious;WFL for tasks assessed/performed               Past Medical History:  Diagnosis Date   Abrasion of finger of right hand 12/28/2011   healing wound right index finger   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 phalanx   GERD (gastroesophageal reflux disease)    Inguinal hernia    Pinched nerve in neck    Rheumatoid arthritis [facility])    Past Surgical History:  Procedure Laterality Date   FINGER SURGERY     INGUINAL HERNIA REPAIR Left 10/07/2015   Procedure: LAPAROSCOPIC LEFT  INGUINAL HERNIA;  Surgeon: Axel Filler, MD;  Location: [facility];  Service: General;  Laterality: Left;   INSERTION OF MESH Left 10/07/2015   Procedure: INSERTION OF MESH;  Surgeon: Axel Filler, MD;  Location: [facility];  Service: General;  Laterality: Left;   Patient Active Problem List   Diagnosis Date Noted   Unspecified rotator cuff tear or rupture of right shoulder, not specified as traumatic 10/12/2022   Patellofemoral crepitus 10/12/2022   Seronegative rheumatoid arthritis (HCC) 03/28/2022   High risk medication use 03/28/2022   Educated about COVID-19 virus infection 08/06/2019   Precordial chest pain 06/20/2019   Nonspecific abnormal electrocardiogram (ECG) (EKG) 06/20/2019   Bilateral carpal tunnel syndrome 01/10/2018   Laryngopharyngeal reflux (LPR) 11/04/2017   Persistent  hoarseness 11/04/2017    PCP: Evangeline Dakin PA  REFERRING PROVIDER: Ramond Marrow MD   REFERRING DIAG: Right knee arthroscopy with ACL reconstruction with allograft  1/30  THERAPY DIAG:  Aftercare for anterior cruciate ligament (ACL) repair  Acute pain of right knee  Localized edema  Stiffness of right knee, not elsewhere classified  Difficulty in walking, not elsewhere classified  Rationale for Evaluation and Treatment: Rehabilitation  ONSET DATE: 08/22/23  SUBJECTIVE:   SUBJECTIVE STATEMENT: Pain is 3/10. Pt has apprehension when walking in his home, he senses instability without the brace.   EVAL: Pt injured knee while playing football on 06/20/23. Reports that he was playing football when he planted his foot and went to change directions which time he felt a pop and sudden pain in the right knee. Eventually he underwent ACL repair by Dr. Everardo Pacific,( Right knee arthroscopy with ACL reconstruction with allograft  1/30) and is seeing him today for f/u. He is WBAT. Using KI at all times.  Not working on ROM yet. He has a lot of pain. No red flags.  He has been seeing MD for shoulder pain but that has been put on hold.   PERTINENT HISTORY: RA, shoulder pain  Date of Surgery: 08/22/2023 Right knee arthroscopy with ACL reconstruction with allograft   PAIN:  Are you having pain? Yes: NPRS scale: 3/10 Pain location: R knee A/P  Pain description: throbbing,  sore Aggravating factors: dependent position, no knee support, moving it  Relieving factors: pillows, cold pack  PRECAUTIONS: Other: protocol  RED FLAGS: None   WEIGHT BEARING RESTRICTIONS: Yes WBAT  FALLS:  Has patient fallen in last 6 months? No  LIVING ENVIRONMENT: Lives with: lives alone Lives in: House/apartment Stairs: Yes: External: 5-6 steps; can reach both Has following equipment at home: Crutches  OCCUPATION: Was a [employer].    PLOF: Independent  PATIENT GOALS: I want to get back to my old self, active  and pain free   NEXT MD VISIT: Today   OBJECTIVE:  Note: Objective measures were completed at Evaluation unless otherwise noted.  DIAGNOSTIC FINDINGS: not available MRI was reviewed and demonstrates an osteochondral lesion consistent with a bone bruise and an ACL tear.     PATIENT SURVEYS:  LEFS 6/80   COGNITION: Overall cognitive status: Within functional limits for tasks assessed     SENSATION: WFL  EDEMA:  Circumferential: Rt 42 cm and Lt. 39 cm   MUSCLE LENGTH: NT   POSTURE: weight shift left guarded   PALPATION: Did not tolerate patellar mobilization , pain and tender to touch   LOWER EXTREMITY ROM:  Active ROM Right eval Left eval Right 09/04/23 Right 09/13/23  Hip flexion      Hip extension      Hip abduction      Hip adduction      Hip internal rotation      Hip external rotation      Knee flexion 45 supine 50 seated   100 supine A 115 supine AA 125 A  Knee extension -10   -5  -3  Ankle dorsiflexion      Ankle plantarflexion      Ankle inversion      Ankle eversion       (Blank rows = not tested)  LOWER EXTREMITY MMT: NT due to acuity  MMT Right eval Left eval  Hip flexion    Hip extension    Hip abduction    Hip adduction    Hip internal rotation    Hip external rotation    Knee flexion    Knee extension    Ankle dorsiflexion    Ankle plantarflexion    Ankle inversion    Ankle eversion     (Blank rows = not tested)  LOWER EXTREMITY SPECIAL TESTS:  NT   FUNCTIONAL TESTS:  None on eval   GAIT: Distance walked: 75  Assistive device utilized:  knee immobilizer  Level of assistance: Modified independence Comments: WBAT no device leans to L with knee immobilizer                                                                                                                               TREATMENT DATE:   [facility] Adult PT Treatment:  DATE: 09/18/23 Therapeutic Activity: NuStep L7 UE and  LE for 5 min  Wall ist 20 deg for 45 sec Wall squat partial to 45 deg x 10 x 2 sets  Step up 4 inch 2 x 15 Calf raise 2 x 10  Prone quad set x 15  Prone hip ext x 15  Prone Hamstring curl x 10  Hip Extension with knee bent x 8 S/L hip adduction and abduction x 15  Modalities: Vaso mod pressure, 34 degrees x 15 minutes , elevated    [facility] Adult PT Treatment:                                                DATE: 09/13/23 SLR  QS Hip abdct  Supine h/c curl with feet on ball  Supine heel slide  Bridge  H/s stretch with strap Prone knee hand 2# x 60 sec  Wall squat to 45 degrees 5 x 2   Modalities: Vaso mod pressure, 34 degrees x 15 minutes , elevated    [facility] Adult PT Treatment:                                                DATE: 09/06/23 Therapeutic Activity: Quad set x 10  SLR 2 x 10  Hamstring 3 x 30 sec  Knee flexion AAROM sliding board x 10, 120 deg Bridging double leg x 15 , partial ROM for hamstring  x 10  Partial bridge with increased WB on Rt LE  x 10  Sit to stand  x 10 Wall sit at 45 deg 3 x 30 sec   NuStep L5 UE and LE for 5 min  Self care:  ROM limits in standing Gait with and without brace, WBAT Daily HEP and ROM   Modalities: Vaso mod pressure, 34 degrees x 15 minutes , elevated    [facility] Adult PT Treatment:                                                DATE: 09/04/23 Therapeutic Exercise: Prone knee hang 1 minute  SLR right x 8  Neuromuscular re-ed: Quad Set with heel prop tactile and verbal cues  Therapeutic Activity: AAROM heel slide> AROM heel slide to promote knee flexion for functional activities  Mini squat 45 degrees to promote knee flexion for functional activities Modalities: Vaso mod pressure, 34 degrees x 15 minutes , elevated Self Care: Patella mobs -demonstration and  self instruction  Pt education -see section  [facility] Adult PT Treatment:                                                DATE: 08/29/23 Therapeutic Activity: Supine ankle  pump Prop leg for Quad set and activation , knee extension Heel slide AAROM seated Modalities: Cold pack 10 min while doing LEFS  Self Care: Brace off when not walking, unwrap leg for skin  Try not to prop under knee Cold pack  Knee AROM, HEP Use 1-2 crutch when ambulating      PATIENT EDUCATION:  Education details: importance of edema management, avoid dependent position prolonged (school) , slow progress with return to walking  Person educated: Patient Education method: Explanation, Demonstration, Verbal cues, and Handouts Education comprehension: verbalized understanding, returned demonstration, and needs further education  HOME EXERCISE PROGRAM: Access Code: [email or web address] URL: [email or web address] Date: 08/29/2023 Prepared by: Karie Mainland  Exercises - Supine Quad Set  - 1 x daily - 7 x weekly - 2 sets - 10 reps - 5-10 hold - Seated Passive Knee Extension  - 1 x daily - 7 x weekly - 1 sets - 5 reps - 30 hold - Seated Knee Flexion AAROM  - 3-5 x daily - 7 x weekly - 1 sets - 10 reps - 15-20 hold - Supine Ankle Pumps  - 3-5 x daily - 7 x weekly - 2 sets - 10 reps  ASSESSMENT:  CLINICAL IMPRESSION: Worked on some closed chain exercises within 45 deg of knee flexion.  He has anxiety with new exercises but overall did very well.  Knee ROM is excellent and strength is improving . Added more appts to his schedule today as well. He does feel unstable at time when walking without the brace at home but did not have any issues today during the session.   EVAL: Patient is a 38 y.o. male who was seen today for physical therapy evaluation and treatment for Rt knee ACL recon with tib ant allograft.    OBJECTIVE IMPAIRMENTS: Abnormal gait, decreased activity tolerance, decreased knowledge of use of DME, decreased mobility, difficulty walking, decreased ROM, decreased strength, decreased safety awareness, increased edema, increased fascial restrictions, and pain.   ACTIVITY  LIMITATIONS: carrying, lifting, bending, sitting, standing, squatting, sleeping, stairs, transfers, bed mobility, hygiene/grooming, locomotion level, and caring for others  PARTICIPATION LIMITATIONS: meal prep, cleaning, interpersonal relationship, driving, shopping, community activity, and occupation  PERSONAL FACTORS: 1-2 comorbidities: RA, shoulder pain   are also affecting patient's functional outcome.   REHAB POTENTIAL: Excellent  CLINICAL DECISION MAKING: Evolving/moderate complexity  EVALUATION COMPLEXITY: Moderate   GOALS: Goals reviewed with patient? Yes  SHORT TERM GOALS: Target date: 09/26/2023   Pt will be I with HEP for basic Rt knee ROM and strength Baseline:given on eval  Goal status: INITIAL  2.  Pt will be able to tolerate seated flexion to > 75 deg for improved transfers  Baseline: limited due to pain and post surgical 09/04/23: >90  Goal status: MET  3.  Pt will be able to perform SLR x 10 in order to progress to ambulation without brace Baseline: unable  09/04/23: 8 reps with rest Goal status: ONGOING    LONG TERM GOALS: Target date: 10/25/2023    Pt will be I with HEP for closed chain strength and stability, core.  Baseline: unknown  Goal status: INITIAL  2.  Pt will improve AROM to 90 deg in standing for functional strength in squat position (120 deg flexion by week 6)  Baseline: see above  Goal status: INITIAL  3.  Pt will achieve full extension in Rt knee for more normalized gait pattern Baseline: -10 deg  Goal status: INITIAL  4.  LEFS score will improve to 20/80 to work towards goals and full function Baseline: 6/80  Goal status: INITIAL  6.  Pt will ambulate without noticeable limp in the community > 500 feet Baseline: knee in KI, no crutches  Goal status: INITIAL   PLAN:  PT FREQUENCY: 2 x/week then 2-3 x per week after 4 weeks (8-12 week POC)   PT DURATION: 8 weeks  PLANNED INTERVENTIONS: 97164- PT Re-evaluation, 97110-Therapeutic  exercises, 97530- Therapeutic activity, [id number]- Neuromuscular re-education, 97535- Self Care, 86578- Manual therapy, [id number]- Gait training, [phone]- Aquatic Therapy, 97014- Electrical stimulation (unattended), 97016- Vasopneumatic device, Patient/Family education, Balance training, Stair training, Taping, Joint mobilization, Manual lymph drainage, Scar mobilization, DME instructions, Cryotherapy, and Moist heat   PLAN FOR NEXT SESSION: ROM and protection , edema control, extension/prone hang. Pgress to full WB 0-45 deg . See [email or web address] for full protocol.  Karie Mainland, PT 09/18/23 12:07 PM Phone: [phone] Fax: [phone]

## 2023-10-21 NOTE — Therapy (Signed)
 OUTPATIENT PHYSICAL THERAPY LOWER EXTREMITY TREATMENT/RE-CERT  Patient Name: Steve Flores MRN: [id number] DOB:[birth year], 38 y.o., male Today's Date: 10/22/2023  END OF SESSION:  PT End of Session - 10/22/23 0732     Visit Number 6    Number of Visits 16    Date for PT Re-Evaluation 12/13/23    Authorization Type [employer]    Authorization - Visit Number 6    Authorization - Number of Visits 27    PT Start Time 0720    PT Stop Time 0800    PT Time Calculation (min) 40 min    Activity Tolerance Patient tolerated treatment well    Behavior During Therapy Anxious;WFL for tasks assessed/performed                Past Medical History:  Diagnosis Date   Abrasion of finger of right hand 12/28/2011   healing wound right index finger   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 phalanx   GERD (gastroesophageal reflux disease)    Inguinal hernia    Pinched nerve in neck    Rheumatoid arthritis [facility])    Past Surgical History:  Procedure Laterality Date   FINGER SURGERY     INGUINAL HERNIA REPAIR Left 10/07/2015   Procedure: LAPAROSCOPIC LEFT  INGUINAL HERNIA;  Surgeon: Axel Filler, MD;  Location: [facility];  Service: General;  Laterality: Left;   INSERTION OF MESH Left 10/07/2015   Procedure: INSERTION OF MESH;  Surgeon: Axel Filler, MD;  Location: [facility];  Service: General;  Laterality: Left;   Patient Active Problem List   Diagnosis Date Noted   Unspecified rotator cuff tear or rupture of right shoulder, not specified as traumatic 10/12/2022   Patellofemoral crepitus 10/12/2022   Seronegative rheumatoid arthritis (HCC) 03/28/2022   High risk medication use 03/28/2022   Educated about COVID-19 virus infection 08/06/2019   Precordial chest pain 06/20/2019   Nonspecific abnormal electrocardiogram (ECG) (EKG) 06/20/2019   Bilateral carpal tunnel syndrome 01/10/2018   Laryngopharyngeal reflux (LPR)  11/04/2017   Persistent hoarseness 11/04/2017    PCP: Evangeline Dakin PA  REFERRING PROVIDER: Ramond Marrow MD   REFERRING DIAG: Right knee arthroscopy with ACL reconstruction with allograft  1/30  THERAPY DIAG:  Aftercare for anterior cruciate ligament (ACL) repair  Acute pain of right knee  Localized edema  Stiffness of right knee, not elsewhere classified  Difficulty in walking, not elsewhere classified  Rationale for Evaluation and Treatment: Rehabilitation  ONSET DATE: 08/22/23  SUBJECTIVE:   SUBJECTIVE STATEMENT: Pain is 0/10. Pt reports swelling and stiffness of the R knee with activity.  EVAL: Pt injured knee while playing football on 06/20/23. Reports that he was playing football when he planted his foot and went to change directions which time he felt a pop and sudden pain in the right knee. Eventually he underwent ACL repair by Dr. Everardo Pacific,( Right knee arthroscopy with ACL reconstruction with allograft  1/30) and is seeing him today for f/u. He is WBAT. Using KI at all times.  Not working on ROM yet. He has a lot of pain. No red flags.  He has been seeing MD for shoulder pain but that has been put on hold.   PERTINENT HISTORY: RA, shoulder pain  Date of Surgery: 08/22/2023 Right knee arthroscopy with ACL reconstruction with allograft   PAIN:  Are you having pain? Yes: NPRS scale: 3/10 Pain location: R knee A/P  Pain description: throbbing, sore Aggravating factors: dependent position, no knee support, moving it  Relieving factors: pillows, cold pack  PRECAUTIONS: Other: protocol  RED FLAGS: None   WEIGHT BEARING RESTRICTIONS: Yes WBAT  FALLS:  Has patient fallen in last 6 months? No  LIVING ENVIRONMENT: Lives with: lives alone Lives in: House/apartment Stairs: Yes: External: 5-6 steps; can reach both Has following equipment at home: Crutches  OCCUPATION: Was a [employer].    PLOF: Independent  PATIENT GOALS: I want to get back to my old self, active  and pain free   NEXT MD VISIT: Today   OBJECTIVE:  Note: Objective measures were completed at Evaluation unless otherwise noted.  DIAGNOSTIC FINDINGS: not available MRI was reviewed and demonstrates an osteochondral lesion consistent with a bone bruise and an ACL tear.     PATIENT SURVEYS:  LEFS 6/80   COGNITION: Overall cognitive status: Within functional limits for tasks assessed     SENSATION: WFL  EDEMA:  Circumferential: Rt 42 cm and Lt. 39 cm   MUSCLE LENGTH: NT   POSTURE: weight shift left guarded   PALPATION: Did not tolerate patellar mobilization , pain and tender to touch   LOWER EXTREMITY ROM:  Active ROM Right eval Left eval Right 09/04/23 Right 09/13/23 RT 10/22/23  Hip flexion       Hip extension       Hip abduction       Hip adduction       Hip internal rotation       Hip external rotation       Knee flexion 45 supine 50 seated   100 supine A 115 supine AA 125 A 130 A  Knee extension -10   -5  -3 0  Ankle dorsiflexion       Ankle plantarflexion       Ankle inversion       Ankle eversion        (Blank rows = not tested)  LOWER EXTREMITY MMT: NT due to acuity  MMT Right eval Left eval  Hip flexion    Hip extension    Hip abduction    Hip adduction    Hip internal rotation    Hip external rotation    Knee flexion    Knee extension    Ankle dorsiflexion    Ankle plantarflexion    Ankle inversion    Ankle eversion     (Blank rows = not tested)  LOWER EXTREMITY SPECIAL TESTS:  NT   FUNCTIONAL TESTS:  None on eval   GAIT: Distance walked: 75  Assistive device utilized:  knee immobilizer  Level of assistance: Modified independence Comments: WBAT no device leans to L with knee immobilizer                                                                                                                               TREATMENT DATE:  [facility] Adult PT Treatment:  9 wks s/p ACLSx                                  DATE:  10/22/23 Therapeutic Activity: NuStep L7 UE and LE for 6 min SLR c quad set 2x10 Calf raise 2 x 10  Prone hip ext 2x15  Prone Hamstring curl 2x15  Hip Extension with knee bent x 8 S/L hip abduction 2 x 15 STS mat table x15 Wall sit 45 deg x5 30"  [facility] Adult PT Treatment:                                                DATE: 09/18/23 Therapeutic Activity: NuStep L7 UE and LE for 5 min  Wall ist 20 deg for 45 sec Wall squat partial to 45 deg x 10 x 2 sets  Step up 4 inch 2 x 15 Calf raise 2 x 10  Prone quad set x 15  Prone hip ext x 15  Prone Hamstring curl x 10  Hip Extension with knee bent x 8 S/L hip adduction and abduction x 15  Modalities: Vaso mod pressure, 34 degrees x 15 minutes , elevated    [facility] Adult PT Treatment:                                                DATE: 09/13/23 SLR  QS Hip abdct  Supine h/c curl with feet on ball  Supine heel slide  Bridge  H/s stretch with strap Prone knee hand 2# x 60 sec  Wall squat to 45 degrees 5 x 2   Modalities: Vaso mod pressure, 34 degrees x 15 minutes , elevated   PATIENT EDUCATION:  Education details: importance of edema management, avoid dependent position prolonged (school) , slow progress with return to walking  Person educated: Patient Education method: Programmer, multimedia, Demonstration, Verbal cues, and Handouts Education comprehension: verbalized understanding, returned demonstration, and needs further education  HOME EXERCISE PROGRAM: Access Code: [email or web address] URL: [email or web address] Date: 10/22/2023 Prepared by: Joellyn Rued  Exercises - Supine Quad Set  - 1 x daily - 7 x weekly - 2 sets - 10 reps - 5-10 hold - Seated Passive Knee Extension  - 1 x daily - 7 x weekly - 1 sets - 5 reps - 30 hold - Seated Knee Flexion AAROM  - 3-5 x daily - 7 x weekly - 1 sets - 10 reps - 15-20 hold - Supine Ankle Pumps  - 3-5 x daily - 7 x weekly - 2 sets - 10 reps - Sidelying Hip Abduction  - 1 x daily - 7 x weekly -  2 sets - 10 reps - Sit to Stand Without Arm Support  - 1 x daily - 7 x weekly - 2 sets - 10 reps - Wall Squat  - 1 x daily - 7 x weekly - 1 sets - 15 reps  ASSESSMENT:  CLINICAL IMPRESSION: Pt returns to PT after being away for over 1 month. Pt presents with appropriate AROM and demonstrates good quad control with a SLR. Pt experiences tightness in the R knee at times with initial bending which improved with repetitions. Pt tolerated  the prescribed exercises without adverse effects. Pt walks with a normalized gait pattern. Pt is making appropriate progressed. Will continue to progress pt with CKC exercises. Pt will continue to benefit from skilled PT 2w6 to address impairments for improved R LE function.  EVAL: Patient is a 38 y.o. male who was seen today for physical therapy evaluation and treatment for Rt knee ACL recon with tib ant allograft.    OBJECTIVE IMPAIRMENTS: Abnormal gait, decreased activity tolerance, decreased knowledge of use of DME, decreased mobility, difficulty walking, decreased ROM, decreased strength, decreased safety awareness, increased edema, increased fascial restrictions, and pain.   ACTIVITY LIMITATIONS: carrying, lifting, bending, sitting, standing, squatting, sleeping, stairs, transfers, bed mobility, hygiene/grooming, locomotion level, and caring for others  PARTICIPATION LIMITATIONS: meal prep, cleaning, interpersonal relationship, driving, shopping, community activity, and occupation  PERSONAL FACTORS: 1-2 comorbidities: RA, shoulder pain   are also affecting patient's functional outcome.   REHAB POTENTIAL: Excellent  CLINICAL DECISION MAKING: Evolving/moderate complexity  EVALUATION COMPLEXITY: Moderate   GOALS: Goals reviewed with patient? Yes  SHORT TERM GOALS: Target date: 09/26/2023   Pt will be I with HEP for basic Rt knee ROM and strength Baseline:given on eval  Goal status: INITIAL  2.  Pt will be able to tolerate seated flexion to > 75 deg  for improved transfers  Baseline: limited due to pain and post surgical 09/04/23: >90  Goal status: MET  3.  Pt will be able to perform SLR x 10 in order to progress to ambulation without brace Baseline: unable  09/04/23: 8 reps with rest Goal status: MET 10/22/23    LONG TERM GOALS: Target date: 12/13/2023    Pt will be I with HEP for closed chain strength and stability, core.  Baseline: unknown  Goal status: ONGOING  2.  Pt will improve AROM to 90 deg in standing for functional strength in squat position (120 deg flexion by week 6)  Baseline: see above  10/22/23: 130d Goal status: MET  3.  Pt will achieve full extension in Rt knee for more normalized gait pattern Baseline: -10 deg  10/22/23: 0d Goal status: MET  4.  LEFS score will improve to 20/80 to work towards goals and full function Baseline: 6/80  Goal status: ONGOING  6.  Pt will ambulate without noticeable limp in the community > 500 feet Baseline: knee in KI, no crutches  Goal status: IMPROVING   PLAN:  PT FREQUENCY: 2 x/week then 2-3 x per week after 4 weeks (8-12 week POC)   PT DURATION: 8 weeks  PLANNED INTERVENTIONS: 97164- PT Re-evaluation, 97110-Therapeutic exercises, 97530- Therapeutic activity, [id number]- Neuromuscular re-education, 97535- Self Care, [id number]- Manual therapy, [id number]- Gait training, [phone]- Aquatic Therapy, 97014- Electrical stimulation (unattended), 97016- Vasopneumatic device, Patient/Family education, Balance training, Stair training, Taping, Joint mobilization, Manual lymph drainage, Scar mobilization, DME instructions, Cryotherapy, and Moist heat   PLAN FOR NEXT SESSION: ROM and protection , edema control, extension/prone hang. Pgress to full WB 0-45 deg . See [email or web address] for full protocol.   Myracle Febres MS, PT 10/22/23 1:11 PM

## 2023-10-22 ENCOUNTER — Ambulatory Visit: Attending: Orthopaedic Surgery

## 2023-10-22 DIAGNOSIS — R600 Localized edema: Secondary | ICD-10-CM | POA: Diagnosis present

## 2023-10-22 DIAGNOSIS — M25661 Stiffness of right knee, not elsewhere classified: Secondary | ICD-10-CM | POA: Diagnosis present

## 2023-10-22 DIAGNOSIS — Z4789 Encounter for other orthopedic aftercare: Secondary | ICD-10-CM | POA: Diagnosis present

## 2023-10-22 DIAGNOSIS — R262 Difficulty in walking, not elsewhere classified: Secondary | ICD-10-CM | POA: Diagnosis present

## 2023-10-22 DIAGNOSIS — M25561 Pain in right knee: Secondary | ICD-10-CM | POA: Diagnosis present

## 2023-10-28 NOTE — Therapy (Signed)
 OUTPATIENT PHYSICAL THERAPY LOWER EXTREMITY TREATMENT  Patient Name: YVETTE ROARK MRN: [id number] DOB:[birth year], 38 y.o., male Today's Date: 10/30/2023  END OF SESSION:  PT End of Session - 10/30/23 1033     Visit Number 7    Number of Visits 16    Date for PT Re-Evaluation 12/13/23    Authorization Type [employer]    Authorization - Visit Number 7    Authorization - Number of Visits 27    PT Start Time 1020    PT Stop Time 1105    PT Time Calculation (min) 45 min    Activity Tolerance Patient tolerated treatment well    Behavior During Therapy Anxious;WFL for tasks assessed/performed                 Past Medical History:  Diagnosis Date   Abrasion of finger of right hand 12/28/2011   healing wound right index finger   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 phalanx   GERD (gastroesophageal reflux disease)    Inguinal hernia    Pinched nerve in neck    Rheumatoid arthritis [facility])    Past Surgical History:  Procedure Laterality Date   FINGER SURGERY     INGUINAL HERNIA REPAIR Left 10/07/2015   Procedure: LAPAROSCOPIC LEFT  INGUINAL HERNIA;  Surgeon: Axel Filler, MD;  Location: [facility];  Service: General;  Laterality: Left;   INSERTION OF MESH Left 10/07/2015   Procedure: INSERTION OF MESH;  Surgeon: Axel Filler, MD;  Location: [facility];  Service: General;  Laterality: Left;   Patient Active Problem List   Diagnosis Date Noted   Unspecified rotator cuff tear or rupture of right shoulder, not specified as traumatic 10/12/2022   Patellofemoral crepitus 10/12/2022   Seronegative rheumatoid arthritis (HCC) 03/28/2022   High risk medication use 03/28/2022   Educated about COVID-19 virus infection 08/06/2019   Precordial chest pain 06/20/2019   Nonspecific abnormal electrocardiogram (ECG) (EKG) 06/20/2019   Bilateral carpal tunnel syndrome 01/10/2018   Laryngopharyngeal reflux (LPR) 11/04/2017    Persistent hoarseness 11/04/2017    PCP: Evangeline Dakin PA  REFERRING PROVIDER: Ramond Marrow MD   REFERRING DIAG: Right knee arthroscopy with ACL reconstruction with allograft  1/30  THERAPY DIAG:  Aftercare for anterior cruciate ligament (ACL) repair  Acute pain of right knee  Localized edema  Stiffness of right knee, not elsewhere classified  Difficulty in walking, not elsewhere classified  Rationale for Evaluation and Treatment: Rehabilitation  ONSET DATE: 08/22/23  SUBJECTIVE:   SUBJECTIVE STATEMENT: Pain is 0/10. Pt reports knee swelling interferes with quality of movement with the R knee. Pt reports no issue with functional use of his R LE at home.  EVAL: Pt injured knee while playing football on 06/20/23. Reports that he was playing football when he planted his foot and went to change directions which time he felt a pop and sudden pain in the right knee. Eventually he underwent ACL repair by Dr. Everardo Pacific,( Right knee arthroscopy with ACL reconstruction with allograft  1/30) and is seeing him today for f/u. He is WBAT. Using KI at all times.  Not working on ROM yet. He has a lot of pain. No red flags.  He has been seeing MD for shoulder pain but that has been put on hold.   PERTINENT HISTORY: RA, shoulder pain  Date of Surgery: 08/22/2023 Right knee arthroscopy with ACL reconstruction with allograft  PAIN:  Are you having pain? Yes: NPRS scale: 0/10 Pain location: R knee A/P  Pain description: throbbing, sore Aggravating factors: dependent position, no knee support, moving it  Relieving factors: pillows, cold pack  PRECAUTIONS: Other: protocol  RED FLAGS: None   WEIGHT BEARING RESTRICTIONS: Yes WBAT  FALLS:  Has patient fallen in last 6 months? No  LIVING ENVIRONMENT: Lives with: lives alone Lives in: House/apartment Stairs: Yes: External: 5-6 steps; can reach both Has following equipment at home: Crutches  OCCUPATION: Was a [employer].    PLOF:  Independent  PATIENT GOALS: I want to get back to my old self, active and pain free   NEXT MD VISIT: Today   OBJECTIVE:  Note: Objective measures were completed at Evaluation unless otherwise noted.  DIAGNOSTIC FINDINGS: not available MRI was reviewed and demonstrates an osteochondral lesion consistent with a bone bruise and an ACL tear.     PATIENT SURVEYS:  LEFS 6/80   COGNITION: Overall cognitive status: Within functional limits for tasks assessed     SENSATION: WFL  EDEMA:  Circumferential: Rt 42 cm and Lt. 39 cm   MUSCLE LENGTH: NT   POSTURE: weight shift left guarded   PALPATION: Did not tolerate patellar mobilization , pain and tender to touch   LOWER EXTREMITY ROM:  Active ROM Right eval Left eval Right 09/04/23 Right 09/13/23 RT 10/22/23  Hip flexion       Hip extension       Hip abduction       Hip adduction       Hip internal rotation       Hip external rotation       Knee flexion 45 supine 50 seated   100 supine A 115 supine AA 125 A 130 A  Knee extension -10   -5  -3 0  Ankle dorsiflexion       Ankle plantarflexion       Ankle inversion       Ankle eversion        (Blank rows = not tested)  LOWER EXTREMITY MMT: NT due to acuity  MMT Right eval Left eval  Hip flexion    Hip extension    Hip abduction    Hip adduction    Hip internal rotation    Hip external rotation    Knee flexion    Knee extension    Ankle dorsiflexion    Ankle plantarflexion    Ankle inversion    Ankle eversion     (Blank rows = not tested)  LOWER EXTREMITY SPECIAL TESTS:  NT   FUNCTIONAL TESTS:  None on eval   GAIT: Distance walked: 75  Assistive device utilized:  knee immobilizer  Level of assistance: Modified independence Comments: WBAT no device leans to L with knee immobilizer  TREATMENT DATE:  [facility] Adult PT Treatment:               10 wks s/p ACLSx                                  DATE: 10/30/23 Therapeutic Activity: NuStep L6 LE for 6 min SLR c quad set 2x10 Calf raise 2 x 15  STS mat table 2 x15 Forward step ups/downs 2x10 4",  Banded side steps RTB min knee flexion Wall slides 2x10 c ball squeeze 45d Lateral side steps 2x10 4"  [facility] Adult PT Treatment:              9 wks s/p ACLSx                                  DATE: 10/22/23 Therapeutic Activity: NuStep L7 UE and LE for 6 min SLR c quad set 2x10 Calf raise 2 x 10  Prone hip ext 2x15  Prone Hamstring curl 2x15  Hip Extension with knee bent x 8 S/L hip abduction 2 x 15 STS mat table x15 Wall sit 45 deg x5 30"  [facility] Adult PT Treatment:                                                DATE: 09/18/23 Therapeutic Activity: NuStep L7 UE and LE for 5 min  Wall ist 20 deg for 45 sec Wall squat partial to 45 deg x 10 x 2 sets  Step up 4 inch 2 x 15 Calf raise 2 x 10  Prone quad set x 15  Prone hip ext x 15  Prone Hamstring curl x 10  Hip Extension with knee bent x 8 S/L hip adduction and abduction x 15  Modalities: Vaso mod pressure, 34 degrees x 15 minutes , elevated    PATIENT EDUCATION:  Education details: importance of edema management, avoid dependent position prolonged (school) , slow progress with return to walking  Person educated: Patient Education method: Programmer, multimedia, Demonstration, Verbal cues, and Handouts Education comprehension: verbalized understanding, returned demonstration, and needs further education  HOME EXERCISE PROGRAM: Access Code: [email or web address] URL: [email or web address] Date: 10/22/2023 Prepared by: Joellyn Rued  Exercises - Supine Quad Set  - 1 x daily - 7 x weekly - 2 sets - 10 reps - 5-10 hold - Seated Passive Knee Extension  - 1 x daily - 7 x weekly - 1 sets - 5 reps - 30 hold - Seated Knee Flexion AAROM  - 3-5 x daily - 7 x weekly - 1 sets - 10 reps - 15-20 hold - Supine Ankle Pumps  - 3-5 x daily - 7  x weekly - 2 sets - 10 reps - Sidelying Hip Abduction  - 1 x daily - 7 x weekly - 2 sets - 10 reps - Sit to Stand Without Arm Support  - 1 x daily - 7 x weekly - 2 sets - 10 reps - Wall Squat  - 1 x daily - 7 x weekly - 1 sets - 15 reps  ASSESSMENT:  CLINICAL IMPRESSION: Pt completed PT for R LE strengthening primarily in the CKC. Step ups were initially attempted with 6 " steps, but decreased to 4",  and banded side steps were completed with minimal knee flexion due to decreased quad strength. Pt tolerated the prescribed exercises without adverse effects. Pt will continue to benefit from skilled PT to address impairments for improved R knee/LE function.   EVAL: Patient is a 38 y.o. male who was seen today for physical therapy evaluation and treatment for Rt knee ACL recon with tib ant allograft.    OBJECTIVE IMPAIRMENTS: Abnormal gait, decreased activity tolerance, decreased knowledge of use of DME, decreased mobility, difficulty walking, decreased ROM, decreased strength, decreased safety awareness, increased edema, increased fascial restrictions, and pain.   ACTIVITY LIMITATIONS: carrying, lifting, bending, sitting, standing, squatting, sleeping, stairs, transfers, bed mobility, hygiene/grooming, locomotion level, and caring for others  PARTICIPATION LIMITATIONS: meal prep, cleaning, interpersonal relationship, driving, shopping, community activity, and occupation  PERSONAL FACTORS: 1-2 comorbidities: RA, shoulder pain   are also affecting patient's functional outcome.   REHAB POTENTIAL: Excellent  CLINICAL DECISION MAKING: Evolving/moderate complexity  EVALUATION COMPLEXITY: Moderate   GOALS: Goals reviewed with patient? Yes  SHORT TERM GOALS: Target date: 09/26/2023   Pt will be I with HEP for basic Rt knee ROM and strength Baseline:given on eval  Goal status: INITIAL  2.  Pt will be able to tolerate seated flexion to > 75 deg for improved transfers  Baseline: limited due to  pain and post surgical 09/04/23: >90  Goal status: MET  3.  Pt will be able to perform SLR x 10 in order to progress to ambulation without brace Baseline: unable  09/04/23: 8 reps with rest Goal status: MET 10/22/23    LONG TERM GOALS: Target date: 12/13/2023    Pt will be I with HEP for closed chain strength and stability, core.  Baseline: unknown  Goal status: ONGOING  2.  Pt will improve AROM to 90 deg in standing for functional strength in squat position (120 deg flexion by week 6)  Baseline: see above  10/22/23: 130d Goal status: MET  3.  Pt will achieve full extension in Rt knee for more normalized gait pattern Baseline: -10 deg  10/22/23: 0d Goal status: MET  4.  LEFS score will improve to 20/80 to work towards goals and full function Baseline: 6/80  Goal status: ONGOING  6.  Pt will ambulate without noticeable limp in the community > 500 feet Baseline: knee in KI, no crutches  Goal status: IMPROVING   PLAN:  PT FREQUENCY: 2 x/week then 2-3 x per week after 4 weeks (8-12 week POC)   PT DURATION: 8 weeks  PLANNED INTERVENTIONS: 97164- PT Re-evaluation, 97110-Therapeutic exercises, 97530- Therapeutic activity, [id number]- Neuromuscular re-education, 97535- Self Care, 16109- Manual therapy, [id number]- Gait training, [phone]- Aquatic Therapy, 97014- Electrical stimulation (unattended), 97016- Vasopneumatic device, Patient/Family education, Balance training, Stair training, Taping, Joint mobilization, Manual lymph drainage, Scar mobilization, DME instructions, Cryotherapy, and Moist heat   PLAN FOR NEXT SESSION: ROM and protection , edema control, extension/prone hang. Pgress to full WB 0-45 deg . See [email or web address] for full protocol.   Pauline Trainer MS, PT 10/30/23 11:40 AM

## 2023-10-30 ENCOUNTER — Ambulatory Visit

## 2023-10-30 DIAGNOSIS — Z4789 Encounter for other orthopedic aftercare: Secondary | ICD-10-CM

## 2023-10-30 DIAGNOSIS — M25561 Pain in right knee: Secondary | ICD-10-CM

## 2023-10-30 DIAGNOSIS — R600 Localized edema: Secondary | ICD-10-CM

## 2023-10-30 DIAGNOSIS — M25661 Stiffness of right knee, not elsewhere classified: Secondary | ICD-10-CM

## 2023-10-30 DIAGNOSIS — R262 Difficulty in walking, not elsewhere classified: Secondary | ICD-10-CM

## 2023-11-06 ENCOUNTER — Encounter: Payer: Self-pay | Admitting: Physical Therapy

## 2023-11-06 ENCOUNTER — Ambulatory Visit: Admitting: Physical Therapy

## 2023-11-06 DIAGNOSIS — Z4789 Encounter for other orthopedic aftercare: Secondary | ICD-10-CM

## 2023-11-06 DIAGNOSIS — M25561 Pain in right knee: Secondary | ICD-10-CM

## 2023-11-06 DIAGNOSIS — R600 Localized edema: Secondary | ICD-10-CM

## 2023-11-06 NOTE — Therapy (Signed)
 OUTPATIENT PHYSICAL THERAPY LOWER EXTREMITY TREATMENT  Patient Name: Steve Flores MRN: [id number] DOB:Apr 22, 1986, 38 y.o., male Today's Date: 11/06/2023  END OF SESSION:  PT End of Session - 11/06/23 0940     Visit Number 8    Number of Visits 16    Date for PT Re-Evaluation 12/13/23    Authorization Type [employer]    Authorization - Visit Number 8    Authorization - Number of Visits 27    PT Start Time 0932    PT Stop Time 1019    PT Time Calculation (min) 47 min                 Past Medical History:  Diagnosis Date   Abrasion of finger of right hand 12/28/2011   healing wound right index finger   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 phalanx   GERD (gastroesophageal reflux disease)    Inguinal hernia    Pinched nerve in neck    Rheumatoid arthritis [facility])    Past Surgical History:  Procedure Laterality Date   FINGER SURGERY     INGUINAL HERNIA REPAIR Left 10/07/2015   Procedure: LAPAROSCOPIC LEFT  INGUINAL HERNIA;  Surgeon: Axel Filler, MD;  Location: [facility];  Service: General;  Laterality: Left;   INSERTION OF MESH Left 10/07/2015   Procedure: INSERTION OF MESH;  Surgeon: Axel Filler, MD;  Location: [facility];  Service: General;  Laterality: Left;   Patient Active Problem List   Diagnosis Date Noted   Unspecified rotator cuff tear or rupture of right shoulder, not specified as traumatic 10/12/2022   Patellofemoral crepitus 10/12/2022   Seronegative rheumatoid arthritis (HCC) 03/28/2022   High risk medication use 03/28/2022   Educated about COVID-19 virus infection 08/06/2019   Precordial chest pain 06/20/2019   Nonspecific abnormal electrocardiogram (ECG) (EKG) 06/20/2019   Bilateral carpal tunnel syndrome 01/10/2018   Laryngopharyngeal reflux (LPR) 11/04/2017   Persistent hoarseness 11/04/2017    PCP: Evangeline Dakin PA  REFERRING PROVIDER: Ramond Marrow MD   REFERRING DIAG:  Right knee arthroscopy with ACL reconstruction with allograft  1/30  THERAPY DIAG:  Aftercare for anterior cruciate ligament (ACL) repair  Acute pain of right knee  Localized edema  Rationale for Evaluation and Treatment: Rehabilitation  ONSET DATE: 08/22/23  SUBJECTIVE:   SUBJECTIVE STATEMENT: Pain is 0/10. Pt reports knee swelling interferes with quality of movement with the R knee. Pt reports no issue with functional use of his R LE at home.  EVAL: Pt injured knee while playing football on 06/20/23. Reports that he was playing football when he planted his foot and went to change directions which time he felt a pop and sudden pain in the right knee. Eventually he underwent ACL repair by Dr. Everardo Pacific,( Right knee arthroscopy with ACL reconstruction with allograft  1/30) and is seeing him today for f/u. He is WBAT. Using KI at all times.  Not working on ROM yet. He has a lot of pain. No red flags.  He has been seeing MD for shoulder pain but that has been put on hold.   PERTINENT HISTORY: RA, shoulder pain  Date of Surgery: 08/22/2023 Right knee arthroscopy with ACL reconstruction with allograft   PAIN:  Are you having pain? Yes: NPRS scale: 0/10 Pain location: R knee A/P  Pain description: throbbing, sore Aggravating factors: dependent position, no knee support, moving it  Relieving factors: pillows,  cold pack  PRECAUTIONS: Other: protocol  RED FLAGS: None   WEIGHT BEARING RESTRICTIONS: Yes WBAT  FALLS:  Has patient fallen in last 6 months? No  LIVING ENVIRONMENT: Lives with: lives alone Lives in: House/apartment Stairs: Yes: External: 5-6 steps; can reach both Has following equipment at home: Crutches  OCCUPATION: Was a [employer].    PLOF: Independent  PATIENT GOALS: I want to get back to my old self, active and pain free   NEXT MD VISIT: Today   OBJECTIVE:  Note: Objective measures were completed at Evaluation unless otherwise noted.  DIAGNOSTIC FINDINGS: not  available MRI was reviewed and demonstrates an osteochondral lesion consistent with a bone bruise and an ACL tear.     PATIENT SURVEYS:  LEFS 6/80   COGNITION: Overall cognitive status: Within functional limits for tasks assessed     SENSATION: WFL  EDEMA:  Circumferential: Rt 42 cm and Lt. 39 cm   MUSCLE LENGTH: NT   POSTURE: weight shift left guarded   PALPATION: Did not tolerate patellar mobilization , pain and tender to touch   LOWER EXTREMITY ROM:  Active ROM Right eval Left eval Right 09/04/23 Right 09/13/23 RT 10/22/23  Hip flexion       Hip extension       Hip abduction       Hip adduction       Hip internal rotation       Hip external rotation       Knee flexion 45 supine 50 seated   100 supine A 115 supine AA 125 A 130 A  Knee extension -10   -5  -3 0  Ankle dorsiflexion       Ankle plantarflexion       Ankle inversion       Ankle eversion        (Blank rows = not tested)  LOWER EXTREMITY MMT: NT due to acuity  MMT Right eval Left eval  Hip flexion    Hip extension    Hip abduction    Hip adduction    Hip internal rotation    Hip external rotation    Knee flexion    Knee extension    Ankle dorsiflexion    Ankle plantarflexion    Ankle inversion    Ankle eversion     (Blank rows = not tested)  LOWER EXTREMITY SPECIAL TESTS:  NT   FUNCTIONAL TESTS:  None on eval   GAIT: Distance walked: 75  Assistive device utilized:  knee immobilizer  Level of assistance: Modified independence Comments: WBAT no device leans to L with knee immobilizer                                                                                                                               TREATMENT DATE:  [facility] Adult PT Treatment:        11 weeks  DATE: 11/06/23 Therapeutic Activity: Side hip abdct 10 x 3 Side clam 10 x 3  SLR x 10 Right SL leg press 45# x 10  6 inch step up Right with 1 UE support  Squat taps to step   10 x 2  SLS 48 seconds  Rebounder toss , 10 tosses without LOB OVAL SLS  Rebounder with SLS on oval  Single leg RDL x 10 AROM Wall squat , small ROM hold 30 sec x 2  Updated HEP    [facility] Adult PT Treatment:              10 wks s/p ACLSx                                  DATE: 10/30/23 Therapeutic Activity: NuStep L6 LE for 6 min SLR c quad set 2x10 Calf raise 2 x 15  STS mat table 2 x15 Forward step ups/downs 2x10 4",  Banded side steps RTB min knee flexion Wall slides 2x10 c ball squeeze 45d Lateral side steps 2x10 4"  [facility] Adult PT Treatment:              9 wks s/p ACLSx                                  DATE: 10/22/23 Therapeutic Activity: NuStep L7 UE and LE for 6 min SLR c quad set 2x10 Calf raise 2 x 10  Prone hip ext 2x15  Prone Hamstring curl 2x15  Hip Extension with knee bent x 8 S/L hip abduction 2 x 15 STS mat table x15 Wall sit 45 deg x5 30"  [facility] Adult PT Treatment:                                                DATE: 09/18/23 Therapeutic Activity: NuStep L7 UE and LE for 5 min  Wall ist 20 deg for 45 sec Wall squat partial to 45 deg x 10 x 2 sets  Step up 4 inch 2 x 15 Calf raise 2 x 10  Prone quad set x 15  Prone hip ext x 15  Prone Hamstring curl x 10  Hip Extension with knee bent x 8 S/L hip adduction and abduction x 15  Modalities: Vaso mod pressure, 34 degrees x 15 minutes , elevated    PATIENT EDUCATION:  Education details: importance of edema management, avoid dependent position prolonged (school) , slow progress with return to walking  Person educated: Patient Education method: Programmer, multimedia, Demonstration, Verbal cues, and Handouts Education comprehension: verbalized understanding, returned demonstration, and needs further education  HOME EXERCISE PROGRAM: Access Code: [email or web address] URL: [email or web address] Date: 10/22/2023 Prepared by: Joellyn Rued  Exercises - Supine Quad Set  - 1 x daily - 7 x weekly - 2 sets - 10 reps - 5-10  hold - Seated Passive Knee Extension  - 1 x daily - 7 x weekly - 1 sets - 5 reps - 30 hold - Seated Knee Flexion AAROM  - 3-5 x daily - 7 x weekly - 1 sets - 10 reps - 15-20 hold - Supine Ankle Pumps  - 3-5 x daily - 7 x weekly - 2 sets - 10 reps - Sidelying Hip  Abduction  - 1 x daily - 7 x weekly - 2 sets - 10 reps - Sit to Stand Without Arm Support  - 1 x daily - 7 x weekly - 2 sets - 10 reps - Wall Squat  - 1 x daily - 7 x weekly - 1 sets - 15 reps - Clamshell  - 1 x daily - 7 x weekly - 3 sets - 10 reps - Crossover Step Up with Knee Drive  - 1 x daily - 7 x weekly - 3 sets - 10 reps - Single leg dead lit- use counter support  - 1 x daily - 7 x weekly - 2-3 sets - 10 reps - Side Stepping with Resistance at Thighs  - 1 x daily - 7 x weekly - 1-2 sets - 10 reps  ASSESSMENT:  CLINICAL IMPRESSION: Pt completed PT for R LE strengthening primarily in the [facility]. Answered questions regarding LE alignment and hip weakness following surgery. Reviewed and progressed lateral hip strength in open and closed chain. He tolerated 6 inch step ups but with cues for alignment and eccentric control. Began SL leg press also with good tolerance and attention to alignment and control. Reviewed banded side steps and wall squats per HEP. Printed most recent HEP with strengthening focus and discussed frequency  Pt tolerated the prescribed exercises without adverse effects. Pt will continue to benefit from skilled PT to address impairments for improved R knee/LE function.   EVAL: Patient is a 38 y.o. male who was seen today for physical therapy evaluation and treatment for Rt knee ACL recon with tib ant allograft.    OBJECTIVE IMPAIRMENTS: Abnormal gait, decreased activity tolerance, decreased knowledge of use of DME, decreased mobility, difficulty walking, decreased ROM, decreased strength, decreased safety awareness, increased edema, increased fascial restrictions, and pain.   ACTIVITY LIMITATIONS: carrying, lifting,  bending, sitting, standing, squatting, sleeping, stairs, transfers, bed mobility, hygiene/grooming, locomotion level, and caring for others  PARTICIPATION LIMITATIONS: meal prep, cleaning, interpersonal relationship, driving, shopping, community activity, and occupation  PERSONAL FACTORS: 1-2 comorbidities: RA, shoulder pain   are also affecting patient's functional outcome.   REHAB POTENTIAL: Excellent  CLINICAL DECISION MAKING: Evolving/moderate complexity  EVALUATION COMPLEXITY: Moderate   GOALS: Goals reviewed with patient? Yes  SHORT TERM GOALS: Target date: 09/26/2023   Pt will be I with HEP for basic Rt knee ROM and strength Baseline:given on eval  Goal status: INITIAL  2.  Pt will be able to tolerate seated flexion to > 75 deg for improved transfers  Baseline: limited due to pain and post surgical 09/04/23: >90  Goal status: MET  3.  Pt will be able to perform SLR x 10 in order to progress to ambulation without brace Baseline: unable  09/04/23: 8 reps with rest Goal status: MET 10/22/23    LONG TERM GOALS: Target date: 12/13/2023    Pt will be I with HEP for closed chain strength and stability, core.  Baseline: unknown  Goal status: ONGOING  2.  Pt will improve AROM to 90 deg in standing for functional strength in squat position (120 deg flexion by week 6)  Baseline: see above  10/22/23: 130d Goal status: MET  3.  Pt will achieve full extension in Rt knee for more normalized gait pattern Baseline: -10 deg  10/22/23: 0d Goal status: MET  4.  LEFS score will improve to 20/80 to work towards goals and full function Baseline: 6/80  Goal status: ONGOING  6.  Pt will ambulate  without noticeable limp in the community > 500 feet Baseline: knee in KI, no crutches  Goal status: IMPROVING   PLAN:  PT FREQUENCY: 2 x/week then 2-3 x per week after 4 weeks (8-12 week POC)   PT DURATION: 8 weeks  PLANNED INTERVENTIONS: 97164- PT Re-evaluation, 97110-Therapeutic  exercises, 97530- Therapeutic activity, [id number]- Neuromuscular re-education, 97535- Self Care, 62130- Manual therapy, [id number]- Gait training, [phone]- Aquatic Therapy, 97014- Electrical stimulation (unattended), 97016- Vasopneumatic device, Patient/Family education, Balance training, Stair training, Taping, Joint mobilization, Manual lymph drainage, Scar mobilization, DME instructions, Cryotherapy, and Moist heat   PLAN FOR NEXT SESSION: ROM and protection , edema control, extension/prone hang. Pgress to full WB 0-45 deg . See [email or web address] for full protocol.   Gasper Karst, PTA 11/06/23 10:33 AM Phone: [phone] Fax: [phone]

## 2023-11-13 ENCOUNTER — Encounter: Payer: Self-pay | Admitting: Physical Therapy

## 2023-11-13 ENCOUNTER — Ambulatory Visit: Admitting: Physical Therapy

## 2023-11-13 DIAGNOSIS — Z4789 Encounter for other orthopedic aftercare: Secondary | ICD-10-CM

## 2023-11-13 DIAGNOSIS — M25561 Pain in right knee: Secondary | ICD-10-CM

## 2023-11-13 NOTE — Therapy (Signed)
 OUTPATIENT PHYSICAL THERAPY LOWER EXTREMITY TREATMENT  Patient Name: Steve Flores MRN: [id number] DOB:1986-02-23, 38 y.o., male Today's Date: 11/13/2023  END OF SESSION:  PT End of Session - 11/13/23 0813     Visit Number 9    Number of Visits 16    Date for PT Re-Evaluation 12/13/23    Authorization Type [employer]    Authorization - Visit Number 9    Authorization - Number of Visits 27    PT Start Time 0810    PT Stop Time 0848    PT Time Calculation (min) 38 min                 Past Medical History:  Diagnosis Date   Abrasion of finger of right hand 12/28/2011   healing wound right index finger   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 phalanx   GERD (gastroesophageal reflux disease)    Inguinal hernia    Pinched nerve in neck    Rheumatoid arthritis [facility])    Past Surgical History:  Procedure Laterality Date   FINGER SURGERY     INGUINAL HERNIA REPAIR Left 10/07/2015   Procedure: LAPAROSCOPIC LEFT  INGUINAL HERNIA;  Surgeon: Shela Derby, MD;  Location: [facility];  Service: General;  Laterality: Left;   INSERTION OF MESH Left 10/07/2015   Procedure: INSERTION OF MESH;  Surgeon: Shela Derby, MD;  Location: [facility];  Service: General;  Laterality: Left;   Patient Active Problem List   Diagnosis Date Noted   Unspecified rotator cuff tear or rupture of right shoulder, not specified as traumatic 10/12/2022   Patellofemoral crepitus 10/12/2022   Seronegative rheumatoid arthritis (HCC) 03/28/2022   High risk medication use 03/28/2022   Educated about COVID-19 virus infection 08/06/2019   Precordial chest pain 06/20/2019   Nonspecific abnormal electrocardiogram (ECG) (EKG) 06/20/2019   Bilateral carpal tunnel syndrome 01/10/2018   Laryngopharyngeal reflux (LPR) 11/04/2017   Persistent hoarseness 11/04/2017    PCP: Annabell Key, [place]  [place]  REFERRING PROVIDER: [name]   REFERRING DIAG:  Right knee arthroscopy with ACL reconstruction with allograft  1/30  THERAPY DIAG:  Aftercare for anterior cruciate ligament (ACL) repair  Acute pain of right knee  Rationale for Evaluation and Treatment: Rehabilitation  ONSET DATE: 08/22/23  SUBJECTIVE:   SUBJECTIVE STATEMENT: Pain is 0/10. Pt reports knee swelling interferes with quality of movement with the R knee. Pt reports no issue with functional use of his R LE at home.  EVAL: Pt injured knee while playing football on 06/20/23. Reports that he was playing football when he planted his foot and went to change directions which time he felt a pop and sudden pain in the right knee. Eventually he underwent ACL repair by Dr. [name],( Right knee arthroscopy with ACL reconstruction with allograft  1/30) and is seeing him today for f/u. He is WBAT. Using KI at all times.  Not working on ROM yet. He has a lot of pain. No red flags.  He has been seeing MD for shoulder pain but that has been put on hold.   PERTINENT HISTORY: RA, shoulder pain  Date of Surgery: 08/22/2023 Right knee arthroscopy with ACL reconstruction with allograft   PAIN:  Are you having pain? Yes: NPRS scale: 0/10 Pain location: R knee A/P  Pain description: throbbing, sore Aggravating factors: dependent position, no knee support, moving it  Relieving factors: pillows, cold pack  PRECAUTIONS: Other: protocol  RED FLAGS: None   WEIGHT BEARING RESTRICTIONS: Yes WBAT  FALLS:  Has patient fallen in last 6 months? No  LIVING ENVIRONMENT: Lives with: lives alone Lives in: House/apartment Stairs: Yes: External: 5-6 steps; can reach both Has following equipment at home: Crutches  OCCUPATION: Was a [employer].    PLOF: Independent  PATIENT GOALS: I want to get back to my old self, active and pain free   NEXT MD VISIT: Today   OBJECTIVE:  Note: Objective measures were completed at Evaluation unless otherwise noted.  DIAGNOSTIC FINDINGS: not available MRI was  reviewed and demonstrates an osteochondral lesion consistent with a bone bruise and an ACL tear.     PATIENT SURVEYS:  LEFS 6/80   COGNITION: Overall cognitive status: Within functional limits for tasks assessed     SENSATION: WFL  EDEMA:  Circumferential: Rt 42 cm and Lt. 39 cm   MUSCLE LENGTH: NT   POSTURE: weight shift left guarded   PALPATION: Did not tolerate patellar mobilization , pain and tender to touch   LOWER EXTREMITY ROM:  Active ROM Right eval Left eval Right 09/04/23 Right 09/13/23 RT 10/22/23  Hip flexion       Hip extension       Hip abduction       Hip adduction       Hip internal rotation       Hip external rotation       Knee flexion 45 supine 50 seated   100 supine A 115 supine AA 125 A 130 A  Knee extension -10   -5  -3 0  Ankle dorsiflexion       Ankle plantarflexion       Ankle inversion       Ankle eversion        (Blank rows = not tested)  LOWER EXTREMITY MMT: NT due to acuity  MMT Right eval Left eval  Hip flexion    Hip extension    Hip abduction    Hip adduction    Hip internal rotation    Hip external rotation    Knee flexion    Knee extension    Ankle dorsiflexion    Ankle plantarflexion    Ankle inversion    Ankle eversion     (Blank rows = not tested)  LOWER EXTREMITY SPECIAL TESTS:  NT   FUNCTIONAL TESTS:  None on eval   GAIT: Distance walked: 75  Assistive device utilized:  knee immobilizer  Level of assistance: Modified independence Comments: WBAT no device leans to L with knee immobilizer                                                                                                                               TREATMENT DATE:  [facility] Adult PT Treatment:        12 weeks  DATE: 11/13/23 Therapeutic Activity: Side hip abdct 10 x 2, 2# Side clam 10 x 2 GTB  Bridge with band GTB SLR 1 x 15  10 inch step up Right with 1 UE support x 10 Squat taps to step  10 x 2   RDL 15# KB 10 x 2    Rebounder with SLS on oval  Single leg RDL 2 x 10 AROM, need UE  Right SL leg press 40# 2 x 10 , CYBEX Wall squat , small ROM hold 30 sec x 2    [facility] Adult PT Treatment:        11 weeks                                         DATE: 11/06/23 Therapeutic Activity: Side hip abdct 10 x 3 Side clam 10 x 3  SLR x 10 Right SL leg press 45# x 10  6 inch step up Right with 1 UE support  Squat taps to step  10 x 2  SLS 48 seconds  Rebounder toss , 10 tosses without LOB OVAL SLS  Rebounder with SLS on oval  Single leg RDL x 10 AROM Wall squat , small ROM hold 30 sec x 2  Updated HEP    [facility] Adult PT Treatment:              10 wks s/p ACLSx                                  DATE: 10/30/23 Therapeutic Activity: NuStep L6 LE for 6 min SLR c quad set 2x10 Calf raise 2 x 15  STS mat table 2 x15 Forward step ups/downs 2x10 4",  Banded side steps RTB min knee flexion Wall slides 2x10 c ball squeeze 45d Lateral side steps 2x10 4"  [facility] Adult PT Treatment:              9 wks s/p ACLSx                                  DATE: 10/22/23 Therapeutic Activity: NuStep L7 UE and LE for 6 min SLR c quad set 2x10 Calf raise 2 x 10  Prone hip ext 2x15  Prone Hamstring curl 2x15  Hip Extension with knee bent x 8 S/L hip abduction 2 x 15 STS mat table x15 Wall sit 45 deg x5 30"  [facility] Adult PT Treatment:                                                DATE: 09/18/23 Therapeutic Activity: NuStep L7 UE and LE for 5 min  Wall ist 20 deg for 45 sec Wall squat partial to 45 deg x 10 x 2 sets  Step up 4 inch 2 x 15 Calf raise 2 x 10  Prone quad set x 15  Prone hip ext x 15  Prone Hamstring curl x 10  Hip Extension with knee bent x 8 S/L hip adduction and abduction x 15  Modalities: Vaso mod pressure, 34 degrees x 15 minutes , elevated    PATIENT EDUCATION:  Education details: importance of edema management, avoid dependent position prolonged (school) , slow progress with  return to walking  Person educated: Patient Education method: Explanation, Demonstration, Verbal cues, and Handouts Education comprehension: verbalized understanding, returned demonstration, and needs further education  HOME EXERCISE PROGRAM: Access Code: [email or web address] URL: [email or web address] Date: 10/22/2023 Prepared by: Liborio Reeds  Exercises - Supine Quad Set  - 1 x daily - 7 x weekly - 2 sets - 10 reps - 5-10 hold - Seated Passive Knee Extension  - 1 x daily - 7 x weekly - 1 sets - 5 reps - 30 hold - Seated Knee Flexion AAROM  - 3-5 x daily - 7 x weekly - 1 sets - 10 reps - 15-20 hold - Supine Ankle Pumps  - 3-5 x daily - 7 x weekly - 2 sets - 10 reps - Sidelying Hip Abduction  - 1 x daily - 7 x weekly - 2 sets - 10 reps - Sit to Stand Without Arm Support  - 1 x daily - 7 x weekly - 2 sets - 10 reps - Wall Squat  - 1 x daily - 7 x weekly - 1 sets - 15 reps - Clamshell  - 1 x daily - 7 x weekly - 3 sets - 10 reps - Crossover Step Up with Knee Drive  - 1 x daily - 7 x weekly - 3 sets - 10 reps - Single leg dead lit- use counter support  - 1 x daily - 7 x weekly - 2-3 sets - 10 reps - Side Stepping with Resistance at Thighs  - 1 x daily - 7 x weekly - 1-2 sets - 10 reps  ASSESSMENT:  CLINICAL IMPRESSION: Pt completed PT for R LE strengthening primarily in the [facility]. He reports min compliance with HEP however he is active with carrying grocery deliveries up stairs. He is now 12 weeks out and can begin walking to jogging progression. Will plan to start this next visit as well as adding lunges and continued lateral hip strength in closed chain.  Pt tolerated the prescribed exercises without adverse effects. Pt will continue to benefit from skilled PT to address impairments for improved R knee/LE function.   EVAL: Patient is a 38 y.o. male who was seen today for physical therapy evaluation and treatment for Rt knee ACL recon with tib ant allograft.    OBJECTIVE IMPAIRMENTS:  Abnormal gait, decreased activity tolerance, decreased knowledge of use of DME, decreased mobility, difficulty walking, decreased ROM, decreased strength, decreased safety awareness, increased edema, increased fascial restrictions, and pain.   ACTIVITY LIMITATIONS: carrying, lifting, bending, sitting, standing, squatting, sleeping, stairs, transfers, bed mobility, hygiene/grooming, locomotion level, and caring for others  PARTICIPATION LIMITATIONS: meal prep, cleaning, interpersonal relationship, driving, shopping, community activity, and occupation  PERSONAL FACTORS: 1-2 comorbidities: RA, shoulder pain   are also affecting patient's functional outcome.   REHAB POTENTIAL: Excellent  CLINICAL DECISION MAKING: Evolving/moderate complexity  EVALUATION COMPLEXITY: Moderate   GOALS: Goals reviewed with patient? Yes  SHORT TERM GOALS: Target date: 09/26/2023   Pt will be I with HEP for basic Rt knee ROM and strength Baseline:given on eval  Goal status: INITIAL  2.  Pt will be able to tolerate seated flexion to > 75 deg for improved transfers  Baseline: limited due to pain and post surgical 09/04/23: >90  Goal status: MET  3.  Pt will be able to perform SLR x 10 in order to progress to ambulation without brace Baseline: unable  09/04/23: 8 reps with rest Goal status: MET 10/22/23    LONG TERM GOALS: Target date: 12/13/2023    Pt will be I with HEP for closed chain strength and stability, core.  Baseline: unknown  Goal status: ONGOING  2.  Pt will improve AROM to 90 deg in standing for functional strength in squat position (120 deg flexion by week 6)  Baseline: see above  10/22/23: 130d Goal status: MET  3.  Pt will achieve full extension in Rt knee for more normalized gait pattern Baseline: -10 deg  10/22/23: 0d Goal status: MET  4.  LEFS score will improve to 20/80 to work towards goals and full function Baseline: 6/80  Goal status: ONGOING  6.  Pt will ambulate without  noticeable limp in the community > 500 feet Baseline: knee in KI, no crutches  Goal status: IMPROVING   PLAN:  PT FREQUENCY: 2 x/week then 2-3 x per week after 4 weeks (8-12 week POC) , add lateral walks, monster walks and lunges next visit. Begin walking program  PT DURATION: 8 weeks  PLANNED INTERVENTIONS: 97164- PT Re-evaluation, 97110-Therapeutic exercises, 97530- Therapeutic activity, [id number]- Neuromuscular re-education, 97535- Self Care, 16109- Manual therapy, [phone]- Gait training, [phone]- Aquatic Therapy, 97014- Electrical stimulation (unattended), 97016- Vasopneumatic device, Patient/Family education, Balance training, Stair training, Taping, Joint mobilization, Manual lymph drainage, Scar mobilization, DME instructions, Cryotherapy, and Moist heat   PLAN FOR NEXT SESSION: ROM and protection , edema control, extension/prone hang. Pgress to full WB 0-45 deg . See [email or web address] for full protocol.   Gasper Karst, PTA 11/13/23 10:41 AM Phone: [phone] Fax: [phone]

## 2023-11-15 ENCOUNTER — Ambulatory Visit

## 2023-11-15 DIAGNOSIS — M25661 Stiffness of right knee, not elsewhere classified: Secondary | ICD-10-CM

## 2023-11-15 DIAGNOSIS — Z4789 Encounter for other orthopedic aftercare: Secondary | ICD-10-CM | POA: Diagnosis not present

## 2023-11-15 DIAGNOSIS — R600 Localized edema: Secondary | ICD-10-CM

## 2023-11-15 DIAGNOSIS — R262 Difficulty in walking, not elsewhere classified: Secondary | ICD-10-CM

## 2023-11-15 DIAGNOSIS — M25561 Pain in right knee: Secondary | ICD-10-CM

## 2023-11-15 NOTE — Therapy (Signed)
 OUTPATIENT PHYSICAL THERAPY LOWER EXTREMITY TREATMENT  Patient Name: Steve Flores MRN: [id number] DOB:1986-02-07, 38 y.o., male Today's Date: 11/15/2023  END OF SESSION:  PT End of Session - 11/15/23 1158     Visit Number 10    Number of Visits 16    Date for PT Re-Evaluation 12/13/23    Authorization Type [employer]    Authorization - Visit Number 10    Authorization - Number of Visits 27    PT Start Time 0800    PT Stop Time 0845    PT Time Calculation (min) 45 min    Activity Tolerance Patient tolerated treatment well    Behavior During Therapy Anxious;WFL for tasks assessed/performed                  Past Medical History:  Diagnosis Date   Abrasion of finger of right hand 12/28/2011   healing wound right index finger   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 phalanx   GERD (gastroesophageal reflux disease)    Inguinal hernia    Pinched nerve in neck    Rheumatoid arthritis [facility])    Past Surgical History:  Procedure Laterality Date   FINGER SURGERY     INGUINAL HERNIA REPAIR Left 10/07/2015   Procedure: LAPAROSCOPIC LEFT  INGUINAL HERNIA;  Surgeon: Shela Derby, Flores;  Location: [facility];  Service: General;  Laterality: Left;   INSERTION OF MESH Left 10/07/2015   Procedure: INSERTION OF MESH;  Surgeon: Shela Derby, Flores;  Location: [facility];  Service: General;  Laterality: Left;   Patient Active Problem List   Diagnosis Date Noted   Unspecified rotator cuff tear or rupture of right shoulder, not specified as traumatic 10/12/2022   Patellofemoral crepitus 10/12/2022   Seronegative rheumatoid arthritis (HCC) 03/28/2022   High risk medication use 03/28/2022   Educated about COVID-19 virus infection 08/06/2019   Precordial chest pain 06/20/2019   Nonspecific abnormal electrocardiogram (ECG) (EKG) 06/20/2019   Bilateral carpal tunnel syndrome 01/10/2018   Laryngopharyngeal reflux (LPR)  11/04/2017   Persistent hoarseness 11/04/2017    PCP: Annabell Key, [place]  [place]  REFERRING PROVIDER: Grafton Lawrence Flores   REFERRING DIAG: Right knee arthroscopy with ACL reconstruction with allograft  1/30  THERAPY DIAG:  Aftercare for anterior cruciate ligament (ACL) repair  Acute pain of right knee  Localized edema  Stiffness of right knee, not elsewhere classified  Difficulty in walking, not elsewhere classified  Rationale for Evaluation and Treatment: Rehabilitation  ONSET DATE: 08/22/23  SUBJECTIVE:   SUBJECTIVE STATEMENT: Pain is 0/10. Pt reports knee swelling is moderate.  EVAL: Pt injured knee while playing football on 06/20/23. Reports that he was playing football when he planted his foot and went to change directions which time he felt a pop and sudden pain in the right knee. Eventually he underwent ACL repair by Dr. Yvonne Flores,( Right knee arthroscopy with ACL reconstruction with allograft  1/30) and is seeing him today for f/u. He is WBAT. Using KI at all times.  Not working on ROM yet. He has a lot of pain. No red flags.  He has been seeing Flores for shoulder pain but that has been put on hold.   PERTINENT HISTORY: RA, shoulder pain  Date of Surgery: 08/22/2023 Right knee arthroscopy with ACL reconstruction with allograft   PAIN:  Are you having pain? Yes: NPRS scale: 0/10 Pain location: R knee A/P  Pain description:  throbbing, sore Aggravating factors: dependent position, no knee support, moving it  Relieving factors: pillows, cold pack  PRECAUTIONS: Other: protocol  RED FLAGS: None   WEIGHT BEARING RESTRICTIONS: Yes WBAT  FALLS:  Has patient fallen in last 6 months? No  LIVING ENVIRONMENT: Lives with: lives alone Lives in: House/apartment Stairs: Yes: External: 5-6 steps; can reach both Has following equipment at home: Crutches  OCCUPATION: Was a [employer].    PLOF: Independent  PATIENT GOALS: I want to get back to my old self, active and pain free   NEXT Flores  VISIT: Today   OBJECTIVE:  Note: Objective measures were completed at Evaluation unless otherwise noted.  DIAGNOSTIC FINDINGS: not available MRI was reviewed and demonstrates an osteochondral lesion consistent with a bone bruise and an ACL tear.     PATIENT SURVEYS:  LEFS 6/80   COGNITION: Overall cognitive status: Within functional limits for tasks assessed     SENSATION: WFL  EDEMA:  Circumferential: Rt 42 cm and Lt. 39 cm   MUSCLE LENGTH: NT   POSTURE: weight shift left guarded   PALPATION: Did not tolerate patellar mobilization , pain and tender to touch   LOWER EXTREMITY ROM:  Active ROM Right eval Left eval Right 09/04/23 Right 09/13/23 RT 10/22/23  Hip flexion       Hip extension       Hip abduction       Hip adduction       Hip internal rotation       Hip external rotation       Knee flexion 45 supine 50 seated   100 supine A 115 supine AA 125 A 130 A  Knee extension -10   -5  -3 0  Ankle dorsiflexion       Ankle plantarflexion       Ankle inversion       Ankle eversion        (Blank rows = not tested)  LOWER EXTREMITY MMT: NT due to acuity  MMT Right eval Left eval  Hip flexion    Hip extension    Hip abduction    Hip adduction    Hip internal rotation    Hip external rotation    Knee flexion    Knee extension    Ankle dorsiflexion    Ankle plantarflexion    Ankle inversion    Ankle eversion     (Blank rows = not tested)  LOWER EXTREMITY SPECIAL TESTS:  NT   FUNCTIONAL TESTS:  None on eval   GAIT: Distance walked: 75  Assistive device utilized:  knee immobilizer  Level of assistance: Modified independence Comments: WBAT no device leans to L with knee immobilizer                                                                                                                               TREATMENT DATE:  [facility] Adult PT Treatment:  12 weeks                                   DATE: 11/15/23 Therapeutic  Activity: Elliptical 2.5  mins forward/backwards R0 L5 Standing side hip abdct 10 x 2, 2.5# on airex Standing side hip 10 x 2, 2.5# on airex Slow Marching to 90d 2x20 Wall squat , small ROM hold 30 sec x 3  Cable walks forward, backward, and lateral out/back x8 each 20#  [facility] Adult PT Treatment:        12 weeks                                         DATE: 11/13/23 Therapeutic Activity: Side hip abdct 10 x 2, 2# Side clam 10 x 2 GTB  Bridge with band GTB SLR 1 x 15  10 inch step up Right with 1 UE support x 10 Squat taps to step  10 x 2  RDL 15# KB 10 x 2    Rebounder with SLS on oval  Single leg RDL 2 x 10 AROM, need UE  Right SL leg press 40# 2 x 10 , CYBEX Wall squat , small ROM hold 30 sec x 2    [facility] Adult PT Treatment:        11 weeks                                         DATE: 11/06/23 Therapeutic Activity: Side hip abdct 10 x 3 Side clam 10 x 3  SLR x 10 Right SL leg press 45# x 10  6 inch step up Right with 1 UE support  Squat taps to step  10 x 2  SLS 48 seconds  Rebounder toss , 10 tosses without LOB OVAL SLS  Rebounder with SLS on oval  Single leg RDL x 10 AROM Wall squat , small ROM hold 30 sec x 2  Updated HEP    PATIENT EDUCATION:  Education details: importance of edema management, avoid dependent position prolonged (school) , slow progress with return to walking  Person educated: Patient Education method: Programmer, multimedia, Demonstration, Verbal cues, and Handouts Education comprehension: verbalized understanding, returned demonstration, and needs further education  HOME EXERCISE PROGRAM: Access Code: [email or web address] URL: [email or web address] Date: 10/22/2023 Prepared by: Liborio Reeds  Exercises - Supine Quad Set  - 1 x daily - 7 x weekly - 2 sets - 10 reps - 5-10 hold - Seated Passive Knee Extension  - 1 x daily - 7 x weekly - 1 sets - 5 reps - 30 hold - Seated Knee Flexion AAROM  - 3-5 x daily - 7 x weekly - 1 sets - 10 reps - 15-20 hold -  Supine Ankle Pumps  - 3-5 x daily - 7 x weekly - 2 sets - 10 reps - Sidelying Hip Abduction  - 1 x daily - 7 x weekly - 2 sets - 10 reps - Sit to Stand Without Arm Support  - 1 x daily - 7 x weekly - 2 sets - 10 reps - Wall Squat  - 1 x daily - 7 x weekly - 1 sets - 15 reps - Clamshell  - 1 x daily - 7 x  weekly - 3 sets - 10 reps - Crossover Step Up with Knee Drive  - 1 x daily - 7 x weekly - 3 sets - 10 reps - Single leg dead lit- use counter support  - 1 x daily - 7 x weekly - 2-3 sets - 10 reps - Side Stepping with Resistance at Thighs  - 1 x daily - 7 x weekly - 1-2 sets - 10 reps  ASSESSMENT:  CLINICAL IMPRESSION: Pt completed PT for R LE strengthening in the [facility]. Cable exs were introduced to good effect. He is now 12 weeks out and can begin walking to jogging progression. Will plan to add lunges the next session. Pt tolerated the prescribed exercises without adverse effects. Pt will continue to benefit from skilled PT to address impairments for improved R knee/LE function.   EVAL: Patient is a 38 y.o. male who was seen today for physical therapy evaluation and treatment for Rt knee ACL recon with tib ant allograft.    OBJECTIVE IMPAIRMENTS: Abnormal gait, decreased activity tolerance, decreased knowledge of use of DME, decreased mobility, difficulty walking, decreased ROM, decreased strength, decreased safety awareness, increased edema, increased fascial restrictions, and pain.   ACTIVITY LIMITATIONS: carrying, lifting, bending, sitting, standing, squatting, sleeping, stairs, transfers, bed mobility, hygiene/grooming, locomotion level, and caring for others  PARTICIPATION LIMITATIONS: meal prep, cleaning, interpersonal relationship, driving, shopping, community activity, and occupation  PERSONAL FACTORS: 1-2 comorbidities: RA, shoulder pain   are also affecting patient's functional outcome.   REHAB POTENTIAL: Excellent  CLINICAL DECISION MAKING: Evolving/moderate  complexity  EVALUATION COMPLEXITY: Moderate   GOALS: Goals reviewed with patient? Yes  SHORT TERM GOALS: Target date: 09/26/2023   Pt will be I with HEP for basic Rt knee ROM and strength Baseline:given on eval  Goal status: INITIAL  2.  Pt will be able to tolerate seated flexion to > 75 deg for improved transfers  Baseline: limited due to pain and post surgical 09/04/23: >90  Goal status: MET  3.  Pt will be able to perform SLR x 10 in order to progress to ambulation without brace Baseline: unable  09/04/23: 8 reps with rest Goal status: MET 10/22/23    LONG TERM GOALS: Target date: 12/13/2023    Pt will be I with HEP for closed chain strength and stability, core.  Baseline: unknown  Goal status: ONGOING  2.  Pt will improve AROM to 90 deg in standing for functional strength in squat position (120 deg flexion by week 6)  Baseline: see above  10/22/23: 130d Goal status: MET  3.  Pt will achieve full extension in Rt knee for more normalized gait pattern Baseline: -10 deg  10/22/23: 0d Goal status: MET  4.  LEFS score will improve to 20/80 to work towards goals and full function Baseline: 6/80  Goal status: ONGOING  6.  Pt will ambulate without noticeable limp in the community > 500 feet Baseline: knee in KI, no crutches  Goal status: IMPROVING   PLAN:  PT FREQUENCY: 2 x/week then 2-3 x per week after 4 weeks (8-12 week POC) , add lateral walks, monster walks and lunges next visit. Begin walking program  PT DURATION: 8 weeks  PLANNED INTERVENTIONS: 97164- PT Re-evaluation, 97110-Therapeutic exercises, 97530- Therapeutic activity, 97112- Neuromuscular re-education, 97535- Self Care, 54098- Manual therapy, [phone]- Gait training, [phone]- Aquatic Therapy, 97014- Electrical stimulation (unattended), 97016- Vasopneumatic device, Patient/Family education, Balance training, Stair training, Taping, Joint mobilization, Manual lymph drainage, Scar mobilization, DME instructions,  Cryotherapy, and  Moist heat   PLAN FOR NEXT SESSION: ROM and protection , edema control, extension/prone hang. Pgress to full WB 0-45 deg . See [email or web address] for full protocol.   Koran Seabrook MS, PT 11/15/23 12:07 PM

## 2023-11-20 ENCOUNTER — Encounter: Payer: Self-pay | Admitting: Physical Therapy

## 2023-11-20 ENCOUNTER — Ambulatory Visit: Admitting: Physical Therapy

## 2023-11-20 DIAGNOSIS — R600 Localized edema: Secondary | ICD-10-CM

## 2023-11-20 DIAGNOSIS — M25561 Pain in right knee: Secondary | ICD-10-CM

## 2023-11-20 DIAGNOSIS — R262 Difficulty in walking, not elsewhere classified: Secondary | ICD-10-CM

## 2023-11-20 DIAGNOSIS — Z4789 Encounter for other orthopedic aftercare: Secondary | ICD-10-CM | POA: Diagnosis not present

## 2023-11-20 DIAGNOSIS — M25661 Stiffness of right knee, not elsewhere classified: Secondary | ICD-10-CM

## 2023-11-20 NOTE — Therapy (Signed)
 OUTPATIENT PHYSICAL THERAPY LOWER EXTREMITY TREATMENT RENEWAL  Patient Name: Steve Flores MRN: [id number] DOB:[birth year], 38 y.o., male Today's Date: 11/21/2023  END OF SESSION:  PT End of Session - 11/20/23 1421     Visit Number 11    Number of Visits 27    Date for PT Re-Evaluation 01/16/24    Authorization Type [employer]    Authorization - Visit Number 11    Authorization - Number of Visits 27    PT Start Time 1417    PT Stop Time 1500    PT Time Calculation (min) 43 min    Activity Tolerance Patient tolerated treatment well    Behavior During Therapy WFL for tasks assessed/performed                   Past Medical History:  Diagnosis Date   Abrasion of finger of right hand 12/28/2011   healing wound right index finger   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 phalanx   GERD (gastroesophageal reflux disease)    Inguinal hernia    Pinched nerve in neck    Rheumatoid arthritis [facility])    Past Surgical History:  Procedure Laterality Date   FINGER SURGERY     INGUINAL HERNIA REPAIR Left 10/07/2015   Procedure: LAPAROSCOPIC LEFT  INGUINAL HERNIA;  Surgeon: Shela Derby, MD;  Location: [facility];  Service: General;  Laterality: Left;   INSERTION OF MESH Left 10/07/2015   Procedure: INSERTION OF MESH;  Surgeon: Shela Derby, MD;  Location: [facility];  Service: General;  Laterality: Left;   Patient Active Problem List   Diagnosis Date Noted   Unspecified rotator cuff tear or rupture of right shoulder, not specified as traumatic 10/12/2022   Patellofemoral crepitus 10/12/2022   Seronegative rheumatoid arthritis (HCC) 03/28/2022   High risk medication use 03/28/2022   Educated about COVID-19 virus infection 08/06/2019   Precordial chest pain 06/20/2019   Nonspecific abnormal electrocardiogram (ECG) (EKG) 06/20/2019   Bilateral carpal tunnel syndrome 01/10/2018   Laryngopharyngeal reflux (LPR)  11/04/2017   Persistent hoarseness 11/04/2017    PCP: Annabell Key, [place]  [place]  REFERRING PROVIDER: [name]   REFERRING DIAG: Right knee arthroscopy with ACL reconstruction with allograft, 1/30  THERAPY DIAG:  Aftercare for anterior cruciate ligament (ACL) repair  Acute pain of right knee  Localized edema  Stiffness of right knee, not elsewhere classified  Difficulty in walking, not elsewhere classified  Rationale for Evaluation and Treatment: Rehabilitation  ONSET DATE: 08/22/23  SUBJECTIVE:   SUBJECTIVE STATEMENT: Pain is 0/10. Pt concerned about how weak his leg is.  He still wants to be able to run.  He is not really pushing his walking program but is on his feet quite a bit with working [employer])  EVAL: Pt injured knee while playing football on 06/20/23. Reports that he was playing football when he planted his foot and went to change directions which time he felt a pop and sudden pain in the right knee. Eventually he underwent ACL repair by Dr. [name],( Right knee arthroscopy with ACL reconstruction with allograft  1/30) and is seeing him today for f/u. He is WBAT. Using KI at all times.  Not working on ROM yet. He has a lot of pain. No red flags.  He has been seeing MD for shoulder pain but that has been put on hold.   PERTINENT HISTORY: RA, shoulder pain  Date of Surgery: 08/22/2023 Right knee arthroscopy with ACL reconstruction with allograft   PAIN:  Are you having pain? Yes: NPRS scale: 0/10 Pain location: R knee A/P  Pain description: throbbing, sore Aggravating factors: dependent position, no knee support, moving it  Relieving factors: pillows, cold pack  PRECAUTIONS: Other: protocol  RED FLAGS: None   WEIGHT BEARING RESTRICTIONS: Yes WBAT  FALLS:  Has patient fallen in last 6 months? No  LIVING ENVIRONMENT: Lives with: lives alone Lives in: House/apartment Stairs: Yes: External: 5-6 steps; can reach both Has following equipment at  home: Crutches  OCCUPATION: Was a [employer].    PLOF: Independent  PATIENT GOALS: I want to get back to my old self, active and pain free   NEXT MD VISIT: Today   OBJECTIVE:  Note: Objective measures were completed at Evaluation unless otherwise noted.  DIAGNOSTIC FINDINGS: not available MRI was reviewed and demonstrates an osteochondral lesion consistent with a bone bruise and an ACL tear.     PATIENT SURVEYS:  LEFS 6/80   11/20/23: 41/80    COGNITION: Overall cognitive status: Within functional limits for tasks assessed     SENSATION: WFL  EDEMA:  Circumferential: Rt 42 cm and Lt. 39 cm   MUSCLE LENGTH: NT   POSTURE: weight shift left guarded   PALPATION: Did not tolerate patellar mobilization , pain and tender to touch   LOWER EXTREMITY ROM:  Active ROM Right eval Left eval Right 09/04/23 Right 09/13/23 RT 10/22/23 Rt. 11/20/23  Hip flexion        Hip extension        Hip abduction        Hip adduction        Hip internal rotation        Hip external rotation        Knee flexion 45 supine 50 seated   100 supine A 115 supine AA 125 A 130 A 128  Knee extension -10   -5  -3 0 0  Ankle dorsiflexion        Ankle plantarflexion        Ankle inversion        Ankle eversion         (Blank rows = not tested)  LOWER EXTREMITY MMT: NT due to acuity  MMT Right 11/20/23  Hip flexion 5  Hip extension   Hip abduction 5  Hip adduction   Hip internal rotation   Hip external rotation   Knee flexion 5  Knee extension 4+/5 shaky  Ankle dorsiflexion 5  Ankle plantarflexion   Ankle inversion   Ankle eversion    (Blank rows = not tested)  LOWER EXTREMITY SPECIAL TESTS:  NT   FUNCTIONAL TESTS:  None on eval   GAIT: Distance walked: 75  Assistive device utilized:  knee immobilizer  Level of assistance: Modified independence Comments: WBAT no device leans to L with knee immobilizer  TREATMENT DATE:   [facility] Adult PT Treatment:                                                DATE: 11/20/23 Therapeutic Activity: Elliptical 2.5  mins forward/backwards R0 L5 SLS 30 sec with mild fatigue and shaking Squat cues for weight in heels SLR x 10  Bridge x 1 hold 30 sec  SL bridge 10 sec hold x 5 Rt LE  Runners Step Up 15 lbs x 2 sets (wgt in Rt then L)  Split squat (tap knee on Airex)  Lunge x 10 (dynamic, single then alternating) 2 sets  Self Care: [facility]CK vs [facility]  Walking for fitness vs just being "up on your feet" LE alignment and control with closed chain exercises   [facility] Adult PT Treatment:             12 weeks                                   DATE: 11/15/23 Therapeutic Activity: Elliptical 2.5  mins forward/backwards R0 L5 Standing side hip abdct 10 x 2, 2.5# on airex Standing side hip 10 x 2, 2.5# on airex Slow Marching to 90d 2x20 Wall squat , small ROM hold 30 sec x 3  Cable walks forward, backward, and lateral out/back x8 each 20#  [facility] Adult PT Treatment:        12 weeks                                         DATE: 11/13/23 Therapeutic Activity: Side hip abdct 10 x 2, 2# Side clam 10 x 2 GTB  Bridge with band GTB SLR 1 x 15 10 inch step up Right with 1 UE support x 10 Squat taps to step  10 x 2  RDL 15# KB 10 x 2  Rebounder with SLS on oval  Single leg RDL 2 x 10 AROM, need UE  Right SL leg press 40# 2 x 10 , CYBEX Wall squat , small ROM hold 30 sec x 2    [facility] Adult PT Treatment:        11 weeks                                         DATE: 11/06/23 Therapeutic Activity: Side hip abdct 10 x 3 Side clam 10 x 3  SLR x 10 Right SL leg press 45# x 10  6 inch step up Right with 1 UE support  Squat taps to step  10 x 2  SLS 48 seconds  Rebounder toss , 10 tosses without LOB OVAL SLS  Rebounder with SLS on oval  Single leg RDL x 10 AROM Wall squat , small ROM hold 30 sec x 2  Updated HEP    PATIENT  EDUCATION:  Education details: importance of edema management, avoid dependent position prolonged (school) , slow progress with return to walking  Person educated: Patient Education method: Explanation, Demonstration, Verbal cues, and Handouts Education comprehension: verbalized understanding, returned demonstration, and needs further education  HOME EXERCISE  PROGRAM: Access Code: [email or web address] URL: [email or web address] Date: 10/22/2023 Prepared by: Liborio Reeds  Exercises - Supine Quad Set  - 1 x daily - 7 x weekly - 2 sets - 10 reps - 5-10 hold - Seated Passive Knee Extension  - 1 x daily - 7 x weekly - 1 sets - 5 reps - 30 hold - Seated Knee Flexion AAROM  - 3-5 x daily - 7 x weekly - 1 sets - 10 reps - 15-20 hold - Supine Ankle Pumps  - 3-5 x daily - 7 x weekly - 2 sets - 10 reps - Sidelying Hip Abduction  - 1 x daily - 7 x weekly - 2 sets - 10 reps - Sit to Stand Without Arm Support  - 1 x daily - 7 x weekly - 2 sets - 10 reps - Wall Squat  - 1 x daily - 7 x weekly - 1 sets - 15 reps - Clamshell  - 1 x daily - 7 x weekly - 3 sets - 10 reps - Crossover Step Up with Knee Drive  - 1 x daily - 7 x weekly - 3 sets - 10 reps - Single leg dead lit- use counter support  - 1 x daily - 7 x weekly - 2-3 sets - 10 reps - Side Stepping with Resistance at Thighs  - 1 x daily - 7 x weekly - 1-2 sets - 10 reps  ASSESSMENT:  CLINICAL IMPRESSION: Patient is at 12 weeks post op, with improved function overall.  He cont to deal with LE fatigue, weakness, min to mod swelling.  We discussed how he is able to now push his program a bit more, beginning a structured walking program with the goal of light jogging in the next 4 weeks. Pt declined ice post session.  Needs cues for squat form and maintaining hip and knee alignment with step ups.  Pt will continue to benefit from skilled PT to address impairments for improved R knee/LE function.   EVAL: Patient is a 38 y.o. male who was seen today for  physical therapy evaluation and treatment for Rt knee ACL recon with tib ant allograft.    OBJECTIVE IMPAIRMENTS: Abnormal gait, decreased activity tolerance, decreased knowledge of use of DME, decreased mobility, difficulty walking, decreased ROM, decreased strength, decreased safety awareness, increased edema, increased fascial restrictions, and pain.   ACTIVITY LIMITATIONS: carrying, lifting, bending, sitting, standing, squatting, sleeping, stairs, transfers, bed mobility, hygiene/grooming, locomotion level, and caring for others  PARTICIPATION LIMITATIONS: meal prep, cleaning, interpersonal relationship, driving, shopping, community activity, and occupation  PERSONAL FACTORS: 1-2 comorbidities: RA, shoulder pain   are also affecting patient's functional outcome.   REHAB POTENTIAL: Excellent  CLINICAL DECISION MAKING: Evolving/moderate complexity  EVALUATION COMPLEXITY: Moderate   GOALS: Goals reviewed with patient? Yes  SHORT TERM GOALS: Target date: 09/26/2023   Pt will be I with HEP for basic Rt knee ROM and strength Baseline:given on eval  Goal status: MET   2.  Pt will be able to tolerate seated flexion to > 75 deg for improved transfers  Baseline: limited due to pain and post surgical 09/04/23: >90  Goal status: MET  3.  Pt will be able to perform SLR x 10 in order to progress to ambulation without brace Baseline: unable  09/04/23: 8 reps with rest Goal status: MET 10/22/23    LONG TERM GOALS: Target date: 12/13/2023    Pt will be I with HEP for closed chain strength and stability, core.  Baseline: unknown  Goal status: ONGOING  2.  Pt will improve AROM to 90 deg in standing for functional strength in squat position (120 deg flexion by week 6)  Baseline: see above  10/22/23: 130d Goal status: MET  3.  Pt will achieve full extension in Rt knee for more normalized gait pattern Baseline: -10 deg  10/22/23: 0d Goal status: MET  4.  LEFS score will improve to 20/80 to  work towards goals and full function Baseline: 6/80  Goal status: MET  6.  Pt will ambulate without noticeable limp in the community > 500 feet Baseline: knee in KI, no crutches  Goal status: MET   7. Pt will be able to light jogging for 15 min without increased knee pain   Baseline:  Goal status: NEW   8. Pt will be able to stand on Rt LE for min to mod dynamic exercises with good control and no pain .    Baseline:  Goal status: NEW    PLAN:  PT FREQUENCY: 2 x/week then 2-3 x per week after 4 weeks (8-12 week POC) , add lateral walks, monster walks and lunges next visit. Begin walking program  PT DURATION: 8 weeks  PLANNED INTERVENTIONS: 97164- PT Re-evaluation, 97110-Therapeutic exercises, 97530- Therapeutic activity, [id number]- Neuromuscular re-education, 97535- Self Care, [id number]- Manual therapy, [id number]- Gait training, [phone]- Aquatic Therapy, 97014- Electrical stimulation (unattended), 97016- Vasopneumatic device, Patient/Family education, Balance training, Stair training, Taping, Joint mobilization, Manual lymph drainage, Scar mobilization, DME instructions, Cryotherapy, and Moist heat   PLAN FOR NEXT SESSION: CKC lunge , lateral walking and squat/RDL  See [email or web address] for full protocol.   Marci Setter, PT 11/21/23 7:56 AM Phone: [phone] Fax: [phone]

## 2023-12-10 ENCOUNTER — Ambulatory Visit: Attending: Orthopaedic Surgery | Admitting: Physical Therapy

## 2023-12-10 ENCOUNTER — Encounter: Payer: Self-pay | Admitting: Physical Therapy

## 2023-12-10 DIAGNOSIS — M25561 Pain in right knee: Secondary | ICD-10-CM | POA: Insufficient documentation

## 2023-12-10 DIAGNOSIS — R600 Localized edema: Secondary | ICD-10-CM | POA: Diagnosis present

## 2023-12-10 DIAGNOSIS — Z4789 Encounter for other orthopedic aftercare: Secondary | ICD-10-CM | POA: Diagnosis present

## 2023-12-10 DIAGNOSIS — R262 Difficulty in walking, not elsewhere classified: Secondary | ICD-10-CM | POA: Diagnosis present

## 2023-12-10 DIAGNOSIS — M25661 Stiffness of right knee, not elsewhere classified: Secondary | ICD-10-CM | POA: Insufficient documentation

## 2023-12-10 NOTE — Therapy (Signed)
 OUTPATIENT PHYSICAL THERAPY LOWER EXTREMITY TREATMENT   Patient Name: Steve Flores MRN: [id number] DOB:June 25, 1986, 38 y.o., male Today's Date: 12/10/2023  END OF SESSION:  PT End of Session - 12/10/23 0923     Visit Number 12    Number of Visits 27    Date for PT Re-Evaluation 01/16/24    Authorization Type [employer]    Authorization - Visit Number 12    Authorization - Number of Visits 27    PT Start Time 0930    PT Stop Time 1013    PT Time Calculation (min) 43 min                   Past Medical History:  Diagnosis Date   Abrasion of finger of right hand 12/28/2011   healing wound right index finger   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 phalanx   GERD (gastroesophageal reflux disease)    Inguinal hernia    Pinched nerve in neck    Rheumatoid arthritis [facility])    Past Surgical History:  Procedure Laterality Date   FINGER SURGERY     INGUINAL HERNIA REPAIR Left 10/07/2015   Procedure: LAPAROSCOPIC LEFT  INGUINAL HERNIA;  Surgeon: Shela Derby, MD;  Location: [facility];  Service: General;  Laterality: Left;   INSERTION OF MESH Left 10/07/2015   Procedure: INSERTION OF MESH;  Surgeon: Shela Derby, MD;  Location: [facility];  Service: General;  Laterality: Left;   Patient Active Problem List   Diagnosis Date Noted   Unspecified rotator cuff tear or rupture of right shoulder, not specified as traumatic 10/12/2022   Patellofemoral crepitus 10/12/2022   Seronegative rheumatoid arthritis (HCC) 03/28/2022   High risk medication use 03/28/2022   Educated about COVID-19 virus infection 08/06/2019   Precordial chest pain 06/20/2019   Nonspecific abnormal electrocardiogram (ECG) (EKG) 06/20/2019   Bilateral carpal tunnel syndrome 01/10/2018   Laryngopharyngeal reflux (LPR) 11/04/2017   Persistent hoarseness 11/04/2017    PCP: Annabell Key, [place]  [place]  REFERRING PROVIDER: [name]   REFERRING  DIAG: Right knee arthroscopy with ACL reconstruction with allograft, 1/30  THERAPY DIAG:  Aftercare for anterior cruciate ligament (ACL) repair  Acute pain of right knee  Rationale for Evaluation and Treatment: Rehabilitation  ONSET DATE: 08/22/23  SUBJECTIVE:   SUBJECTIVE STATEMENT: 12/10/23: Pt reports limited compliance with formal HEP however has been active with delivery jobs, stairs and has tried some light jogging.    11/20/23: Pain is 0/10. Pt concerned about how weak his leg is.  He still wants to be able to run.  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  EVAL: Pt injured knee while playing football on 06/20/23. Reports that he was playing football when he planted his foot and went to change directions which time he felt a pop and sudden pain in the right knee. Eventually he underwent ACL repair by Dr. [name],( Right knee arthroscopy with ACL reconstruction with allograft  1/30) and is seeing him today for f/u. He is WBAT. Using KI at all times.  Not working on ROM yet. He has a lot of pain. No red flags.  He has been seeing MD for shoulder pain but that has been put on hold.   PERTINENT HISTORY: RA, shoulder pain  Date of Surgery: 08/22/2023 Right knee arthroscopy with ACL reconstruction  with allograft   PAIN:  Are you having pain? Yes: NPRS scale: 0/10 Pain location: R knee A/P  Pain description: throbbing, sore Aggravating factors: dependent position, no knee support, moving it  Relieving factors: pillows, cold pack  PRECAUTIONS: Other: protocol  RED FLAGS: None   WEIGHT BEARING RESTRICTIONS: Yes WBAT  FALLS:  Has patient fallen in last 6 months? No  LIVING ENVIRONMENT: Lives with: lives alone Lives in: House/apartment Stairs: Yes: External: 5-6 steps; can reach both Has following equipment at home: Crutches  OCCUPATION: Was a [employer].    PLOF: Independent  PATIENT GOALS: I want to get  back to my old self, active and pain free   NEXT MD VISIT: Today   OBJECTIVE:  Note: Objective measures were completed at Evaluation unless otherwise noted.  DIAGNOSTIC FINDINGS: not available MRI was reviewed and demonstrates an osteochondral lesion consistent with a bone bruise and an ACL tear.     PATIENT SURVEYS:  LEFS 6/80   11/20/23: 41/80    COGNITION: Overall cognitive status: Within functional limits for tasks assessed     SENSATION: WFL  EDEMA:  Circumferential: Rt 42 cm and Lt. 39 cm   MUSCLE LENGTH: NT   POSTURE: weight shift left guarded   PALPATION: Did not tolerate patellar mobilization , pain and tender to touch   LOWER EXTREMITY ROM:  Active ROM Right eval Left eval Right 09/04/23 Right 09/13/23 RT 10/22/23 Rt. 11/20/23  Hip flexion        Hip extension        Hip abduction        Hip adduction        Hip internal rotation        Hip external rotation        Knee flexion 45 supine 50 seated   100 supine A 115 supine AA 125 A 130 A 128  Knee extension -10   -5  -3 0 0  Ankle dorsiflexion        Ankle plantarflexion        Ankle inversion        Ankle eversion         (Blank rows = not tested)  LOWER EXTREMITY MMT: NT due to acuity  MMT Right 11/20/23  Hip flexion 5  Hip extension   Hip abduction 5  Hip adduction   Hip internal rotation   Hip external rotation   Knee flexion 5  Knee extension 4+/5 shaky  Ankle dorsiflexion 5  Ankle plantarflexion   Ankle inversion   Ankle eversion    (Blank rows = not tested)  LOWER EXTREMITY SPECIAL TESTS:  NT   FUNCTIONAL TESTS:  None on eval   GAIT: Distance walked: 75  Assistive device utilized: knee immobilizer  Level of assistance: Modified independence Comments: WBAT no device leans to L with knee immobilizer  TREATMENT DATE:  [facility] Adult PT Treatment:                                                 DATE: 12/10/23  Therapeutic Activity: 3 min walking on T.M. warm up 3.0 MPH T.M jogging 5 minutes @ 5.0 MPH  Dead lift with Bar 45# 4 sets of 8 Bulgarian split squat 3 sets of 8 - 1 UE assist Leg press bilat 100# 3 sets of 8      [facility] Adult PT Treatment:                                                DATE: 11/20/23 Therapeutic Activity: Elliptical 2.5  mins forward/backwards R0 L5 SLS 30 sec with mild fatigue and shaking Squat cues for weight in heels SLR x 10  Bridge x 1 hold 30 sec  SL bridge 10 sec hold x 5 Rt LE  Runners Step Up 15 lbs x 2 sets (wgt in Rt then L)  Split squat (tap knee on Airex)  Lunge x 10 (dynamic, single then alternating) 2 sets  Self Care: CCK vs [facility]  Walking for fitness vs just being "up on your feet" LE alignment and control with closed chain exercises   [facility] Adult PT Treatment:             12 weeks                                   DATE: 11/15/23 Therapeutic Activity: Elliptical 2.5  mins forward/backwards R0 L5 Standing side hip abdct 10 x 2, 2.5# on airex Standing side hip 10 x 2, 2.5# on airex Slow Marching to 90d 2x20 Wall squat , small ROM hold 30 sec x 3  Cable walks forward, backward, and lateral out/back x8 each 20#  [facility] Adult PT Treatment:        12 weeks                                         DATE: 11/13/23 Therapeutic Activity: Side hip abdct 10 x 2, 2# Side clam 10 x 2 GTB  Bridge with band GTB SLR 1 x 15 10 inch step up Right with 1 UE support x 10 Squat taps to step  10 x 2  RDL 15# KB 10 x 2  Rebounder with SLS on oval  Single leg RDL 2 x 10 AROM, need UE  Right SL leg press 40# 2 x 10 , CYBEX Wall squat , small ROM hold 30 sec x 2    [facility] Adult PT Treatment:        11 weeks                                         DATE: 11/06/23 Therapeutic Activity: Side hip abdct 10 x 3 Side clam 10 x 3  SLR x 10 Right SL leg press 45# x 10  6 inch  step up Right with 1 UE support  Squat taps  to step  10 x 2  SLS 48 seconds  Rebounder toss , 10 tosses without LOB OVAL SLS  Rebounder with SLS on oval  Single leg RDL x 10 AROM Wall squat , small ROM hold 30 sec x 2  Updated HEP    PATIENT EDUCATION:  Education details: importance of edema management, avoid dependent position prolonged (school) , slow progress with return to walking  Person educated: Patient Education method: Programmer, multimedia, Demonstration, Verbal cues, and Handouts Education comprehension: verbalized understanding, returned demonstration, and needs further education  HOME EXERCISE PROGRAM: Access Code: [email or web address] URL: [email or web address] Date: 10/22/2023 Prepared by: Liborio Reeds  Exercises - Supine Quad Set  - 1 x daily - 7 x weekly - 2 sets - 10 reps - 5-10 hold - Seated Passive Knee Extension  - 1 x daily - 7 x weekly - 1 sets - 5 reps - 30 hold - Seated Knee Flexion AAROM  - 3-5 x daily - 7 x weekly - 1 sets - 10 reps - 15-20 hold - Supine Ankle Pumps  - 3-5 x daily - 7 x weekly - 2 sets - 10 reps - Sidelying Hip Abduction  - 1 x daily - 7 x weekly - 2 sets - 10 reps - Sit to Stand Without Arm Support  - 1 x daily - 7 x weekly - 2 sets - 10 reps - Wall Squat  - 1 x daily - 7 x weekly - 1 sets - 15 reps - Clamshell  - 1 x daily - 7 x weekly - 3 sets - 10 reps - Crossover Step Up with Knee Drive  - 1 x daily - 7 x weekly - 3 sets - 10 reps - Single leg dead lit- use counter support  - 1 x daily - 7 x weekly - 2-3 sets - 10 reps - Side Stepping with Resistance at Thighs  - 1 x daily - 7 x weekly - 1-2 sets - 10 reps  ASSESSMENT:  CLINICAL IMPRESSION: Patient is at 15 weeks post op. Has been taking final exams and has not completed HEP. Has done some light jogging. 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 Able to begin jogging on T.M for 5 minutes with no increased pain. Looked at independent gym program on page 3 of protocol and picked 1 exercise from each category today and  completed 3-4 sets. He did well with min cues for form throughout. Pt will continue to benefit from skilled PT to address impairments for improved R knee/LE function.   EVAL: Patient is a 38 y.o. male who was seen today for physical therapy evaluation and treatment for Rt knee ACL recon with tib ant allograft.    OBJECTIVE IMPAIRMENTS: Abnormal gait, decreased activity tolerance, decreased knowledge of use of DME, decreased mobility, difficulty walking, decreased ROM, decreased strength, decreased safety awareness, increased edema, increased fascial restrictions, and pain.   ACTIVITY LIMITATIONS: carrying, lifting, bending, sitting, standing, squatting, sleeping, stairs, transfers, bed mobility, hygiene/grooming, locomotion level, and caring for others  PARTICIPATION LIMITATIONS: meal prep, cleaning, interpersonal relationship, driving, shopping, community activity, and occupation  PERSONAL FACTORS: 1-2 comorbidities: RA, shoulder pain  are also affecting patient's functional outcome.   REHAB POTENTIAL: Excellent  CLINICAL DECISION MAKING: Evolving/moderate complexity  EVALUATION COMPLEXITY: Moderate   GOALS: Goals reviewed with patient? Yes  SHORT TERM GOALS: Target date: 09/26/2023   Pt will be I with HEP for basic  Rt knee ROM and strength Baseline:given on eval  Goal status: MET   2.  Pt will be able to tolerate seated flexion to > 75 deg for improved transfers  Baseline: limited due to pain and post surgical 09/04/23: >90  Goal status: MET  3.  Pt will be able to perform SLR x 10 in order to progress to ambulation without brace Baseline: unable  09/04/23: 8 reps with rest Goal status: MET 10/22/23    LONG TERM GOALS: Target date: 12/13/2023    Pt will be I with HEP for closed chain strength and stability, core.  Baseline: unknown  Goal status: ONGOING  2.  Pt will improve AROM to 90 deg in standing for functional strength in squat position (120 deg flexion by week 6)   Baseline: see above  10/22/23: 130d Goal status: MET  3.  Pt will achieve full extension in Rt knee for more normalized gait pattern Baseline: -10 deg  10/22/23: 0d Goal status: MET  4.  LEFS score will improve to 20/80 to work towards goals and full function Baseline: 6/80  Goal status: MET  6.  Pt will ambulate without noticeable limp in the community > 500 feet Baseline: knee in KI, no crutches  Goal status: MET   7. Pt will be able to light jogging for 15 min without increased knee pain   Baseline: 12/10/23: completed 5 minutes  Goal status: ONGOING   8. Pt will be able to stand on Rt LE for min to mod dynamic exercises with good control and no pain .    Baseline:  Goal status: NEW    PLAN:  PT FREQUENCY: 2 x/week then 2-3 x per week after 4 weeks (8-12 week POC) , add lateral walks, monster walks and lunges next visit. Begin walking program  PT DURATION: 8 weeks  PLANNED INTERVENTIONS: 97164- PT Re-evaluation, 97110-Therapeutic exercises, 97530- Therapeutic activity, [id number]- Neuromuscular re-education, 97535- Self Care, 54098- Manual therapy, [id number]- Gait training, [phone]- Aquatic Therapy, 97014- Electrical stimulation (unattended), 97016- Vasopneumatic device, Patient/Family education, Balance training, Stair training, Taping, Joint mobilization, Manual lymph drainage, Scar mobilization, DME instructions, Cryotherapy, and Moist heat   PLAN FOR NEXT SESSION: CKC lunge , lateral walking and squat/RDL  See [email or web address] for full protocol.   Gasper Karst, PTA 12/10/23 11:13 AM Phone: [phone] Fax: [phone]

## 2023-12-18 ENCOUNTER — Encounter: Payer: Self-pay | Admitting: Physical Therapy

## 2023-12-18 ENCOUNTER — Ambulatory Visit: Admitting: Physical Therapy

## 2023-12-18 DIAGNOSIS — M25561 Pain in right knee: Secondary | ICD-10-CM

## 2023-12-18 DIAGNOSIS — Z4789 Encounter for other orthopedic aftercare: Secondary | ICD-10-CM

## 2023-12-18 NOTE — Therapy (Signed)
 OUTPATIENT PHYSICAL THERAPY LOWER EXTREMITY TREATMENT   Patient Name: Steve Flores MRN: [id number] DOB:04-24-1986, 38 y.o., male Today's Date: 12/18/2023  END OF SESSION:  PT End of Session - 12/18/23 0937     Visit Number 13    Number of Visits 27    Date for PT Re-Evaluation 01/16/24    Authorization Type [employer]    Authorization - Visit Number 13    Authorization - Number of Visits 27    PT Start Time 0933    PT Stop Time 1016    PT Time Calculation (min) 43 min                   Past Medical History:  Diagnosis Date   Abrasion of finger of right hand 12/28/2011   healing wound right index finger   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 phalanx   GERD (gastroesophageal reflux disease)    Inguinal hernia    Pinched nerve in neck    Rheumatoid arthritis [facility])    Past Surgical History:  Procedure Laterality Date   FINGER SURGERY     INGUINAL HERNIA REPAIR Left 10/07/2015   Procedure: LAPAROSCOPIC LEFT  INGUINAL HERNIA;  Surgeon: Shela Derby, MD;  Location: [facility];  Service: General;  Laterality: Left;   INSERTION OF MESH Left 10/07/2015   Procedure: INSERTION OF MESH;  Surgeon: Shela Derby, MD;  Location: [facility];  Service: General;  Laterality: Left;   Patient Active Problem List   Diagnosis Date Noted   Unspecified rotator cuff tear or rupture of right shoulder, not specified as traumatic 10/12/2022   Patellofemoral crepitus 10/12/2022   Seronegative rheumatoid arthritis (HCC) 03/28/2022   High risk medication use 03/28/2022   Educated about COVID-19 virus infection 08/06/2019   Precordial chest pain 06/20/2019   Nonspecific abnormal electrocardiogram (ECG) (EKG) 06/20/2019   Bilateral carpal tunnel syndrome 01/10/2018   Laryngopharyngeal reflux (LPR) 11/04/2017   Persistent hoarseness 11/04/2017    PCP: Annabell Key, [place]  [place]  REFERRING PROVIDER: [name]   REFERRING  DIAG: Right knee arthroscopy with ACL reconstruction with allograft, 1/30  THERAPY DIAG:  Aftercare for anterior cruciate ligament (ACL) repair  Acute pain of right knee  Rationale for Evaluation and Treatment: Rehabilitation  ONSET DATE: 08/22/23  SUBJECTIVE:   SUBJECTIVE STATEMENT:  12/18/23: Pt reports compliance with HEP, limited reps, notes anterior distal knee pain and some lateral lower leg pain.   12/10/23: Pt reports limited compliance with formal HEP however has been active with delivery jobs, stairs and has tried some light jogging.    11/20/23: Pain is 0/10. Pt concerned about how weak his leg is.  He still wants to be able to run.  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  EVAL: Pt injured knee while playing football on 06/20/23. Reports that he was playing football when he planted his foot and went to change directions which time he felt a pop and sudden pain in the right knee. Eventually he underwent ACL repair by Dr. [name],( Right knee arthroscopy with ACL reconstruction with allograft  1/30) and is seeing him today for f/u. He is WBAT. Using KI at all times.  Not working on ROM yet. He has a lot of pain. No red flags.  He has been seeing MD for shoulder pain but that has  been put on hold.   PERTINENT HISTORY: RA, shoulder pain  Date of Surgery: 08/22/2023 Right knee arthroscopy with ACL reconstruction with allograft   PAIN:  Are you having pain? Yes: NPRS scale: 0/10 Pain location: R knee A/P  Pain description: throbbing, sore Aggravating factors: dependent position, no knee support, moving it  Relieving factors: pillows, cold pack  PRECAUTIONS: Other: protocol  RED FLAGS: None   WEIGHT BEARING RESTRICTIONS: Yes WBAT  FALLS:  Has patient fallen in last 6 months? No  LIVING ENVIRONMENT: Lives with: lives alone Lives in: House/apartment Stairs: Yes: External: 5-6 steps; can reach both Has  following equipment at home: Crutches  OCCUPATION: Was a [employer].    PLOF: Independent  PATIENT GOALS: I want to get back to my old self, active and pain free   NEXT MD VISIT: Today   OBJECTIVE:  Note: Objective measures were completed at Evaluation unless otherwise noted.  DIAGNOSTIC FINDINGS: not available MRI was reviewed and demonstrates an osteochondral lesion consistent with a bone bruise and an ACL tear.     PATIENT SURVEYS:  LEFS 6/80   11/20/23: 41/80    COGNITION: Overall cognitive status: Within functional limits for tasks assessed     SENSATION: WFL  EDEMA:  Circumferential: Rt 42 cm and Lt. 39 cm   MUSCLE LENGTH: NT   POSTURE: weight shift left guarded   PALPATION: Did not tolerate patellar mobilization , pain and tender to touch   LOWER EXTREMITY ROM:  Active ROM Right eval Left eval Right 09/04/23 Right 09/13/23 RT 10/22/23 Rt. 11/20/23 RT 12/18/23  Hip flexion         Hip extension         Hip abduction         Hip adduction         Hip internal rotation         Hip external rotation         Knee flexion 45 supine 50 seated   100 supine A 115 supine AA 125 A 130 A 128 130  Knee extension -10   -5  -3 0 0   Ankle dorsiflexion         Ankle plantarflexion         Ankle inversion         Ankle eversion          (Blank rows = not tested)  LOWER EXTREMITY MMT: NT due to acuity  MMT Right 11/20/23  Hip flexion 5  Hip extension   Hip abduction 5  Hip adduction   Hip internal rotation   Hip external rotation   Knee flexion 5  Knee extension 4+/5 shaky  Ankle dorsiflexion 5  Ankle plantarflexion   Ankle inversion   Ankle eversion    (Blank rows = not tested)  LOWER EXTREMITY SPECIAL TESTS:  NT   FUNCTIONAL TESTS:  None on eval   GAIT: Distance walked: 75  Assistive device utilized: knee immobilizer  Level of assistance: Modified independence Comments: WBAT no device leans to L with knee immobilizer  TREATMENT DATE:  [facility] Adult PT Treatment:                                                DATE: 12/18/23 Therapeutic Exercise: Hip flexor stretch with strap pull H/ s stretch TPR right peroneal Ankle eversion bilateral with RTB x 15  Therapeutic Activity: 2 min walking on T.M. warm up 3.0 MPH T.M jogging 5 minutes @ 5.0 MPH  Dead lift with Bar 45# 2 sets of 8 (limited due to time)  Dynamic and static lunges - experiencing knee pain with dynamic lunge -mod cues     [facility] Adult PT Treatment:                                                DATE: 12/10/23  Therapeutic Activity: 3 min walking on T.M. warm up 3.0 MPH T.M jogging 5 minutes @ 5.0 MPH  Dead lift with Bar 45# 4 sets of 8 Bulgarian split squat 3 sets of 8 - 1 UE assist Leg press bilat 100# 3 sets of 8      [facility] Adult PT Treatment:                                                DATE: 11/20/23 Therapeutic Activity: Elliptical 2.5  mins forward/backwards R0 L5 SLS 30 sec with mild fatigue and shaking Squat cues for weight in heels SLR x 10  Bridge x 1 hold 30 sec  SL bridge 10 sec hold x 5 Rt LE  Runners Step Up 15 lbs x 2 sets (wgt in Rt then L)  Split squat (tap knee on Airex)  Lunge x 10 (dynamic, single then alternating) 2 sets  Self Care: [facility] vs [facility]  Walking for fitness vs just being "up on your feet" LE alignment and control with closed chain exercises   [facility] Adult PT Treatment:             12 weeks                                   DATE: 11/15/23 Therapeutic Activity: Elliptical 2.5  mins forward/backwards R0 L5 Standing side hip abdct 10 x 2, 2.5# on airex Standing side hip 10 x 2, 2.5# on airex Slow Marching to 90d 2x20 Wall squat , small ROM hold 30 sec x 3  Cable walks forward, backward, and lateral out/back x8 each 20#  [facility] Adult PT Treatment:        12 weeks                                         DATE:  11/13/23 Therapeutic Activity: Side hip abdct 10 x 2, 2# Side clam 10 x 2 GTB  Bridge with band GTB SLR 1 x 15 10 inch step up Right with 1 UE support x 10 Squat taps to step  10 x 2  RDL 15# KB  10 x 2  Rebounder with SLS on oval  Single leg RDL 2 x 10 AROM, need UE  Right SL leg press 40# 2 x 10 , CYBEX Wall squat , small ROM hold 30 sec x 2    [facility] Adult PT Treatment:        11 weeks                                         DATE: 11/06/23 Therapeutic Activity: Side hip abdct 10 x 3 Side clam 10 x 3  SLR x 10 Right SL leg press 45# x 10  6 inch step up Right with 1 UE support  Squat taps to step  10 x 2  SLS 48 seconds  Rebounder toss , 10 tosses without LOB OVAL SLS  Rebounder with SLS on oval  Single leg RDL x 10 AROM Wall squat , small ROM hold 30 sec x 2  Updated HEP    PATIENT EDUCATION:  Education details: importance of edema management, avoid dependent position prolonged (school) , slow progress with return to walking  Person educated: Patient Education method: Programmer, multimedia, Demonstration, Verbal cues, and Handouts Education comprehension: verbalized understanding, returned demonstration, and needs further education  HOME EXERCISE PROGRAM: Access Code: [email or web address] URL: [email or web address] Date: 10/22/2023 Prepared by: Liborio Reeds  Exercises - Supine Quad Set  - 1 x daily - 7 x weekly - 2 sets - 10 reps - 5-10 hold - Seated Passive Knee Extension  - 1 x daily - 7 x weekly - 1 sets - 5 reps - 30 hold - Seated Knee Flexion AAROM  - 3-5 x daily - 7 x weekly - 1 sets - 10 reps - 15-20 hold - Supine Ankle Pumps  - 3-5 x daily - 7 x weekly - 2 sets - 10 reps - Sidelying Hip Abduction  - 1 x daily - 7 x weekly - 2 sets - 10 reps - Sit to Stand Without Arm Support  - 1 x daily - 7 x weekly - 2 sets - 10 reps - Wall Squat  - 1 x daily - 7 x weekly - 1 sets - 15 reps - Clamshell  - 1 x daily - 7 x weekly - 3 sets - 10 reps - Crossover Step Up with Knee Drive   - 1 x daily - 7 x weekly - 3 sets - 10 reps - Single leg dead lit- use counter support  - 1 x daily - 7 x weekly - 2-3 sets - 10 reps - Side Stepping with Resistance at Thighs  - 1 x daily - 7 x weekly - 1-2 sets - 10 reps  ASSESSMENT:  CLINICAL IMPRESSION: Patient is at 16 weeks post op. Has been more compliant with recent HEP this week, although less reps. Notes some distal anterior knee pain and lateral knee pain. Tender along peroneals. Performed TPR along peroneals and activation with ankle ER with resistance. Has done some light jogging. 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 Able to continue jogging on T.M for 5 minutes with no increased pain. Continued with independent gym program on page 3 of protocol and had limited time in session to complete sets. Worked on static and dynamic lunge which took most of today's session time. He did well with min/mod cues for form throughout and did experience increased knee pain with dynamic lunge.  Pt will continue to benefit from skilled PT to address impairments for improved R knee/LE function. He would like to focus on his gym program rather than jogging at future session. Encouraged stretching quad and icing more often.   EVAL: Patient is a 38 y.o. male who was seen today for physical therapy evaluation and treatment for Rt knee ACL recon with tib ant allograft.    OBJECTIVE IMPAIRMENTS: Abnormal gait, decreased activity tolerance, decreased knowledge of use of DME, decreased mobility, difficulty walking, decreased ROM, decreased strength, decreased safety awareness, increased edema, increased fascial restrictions, and pain.   ACTIVITY LIMITATIONS: carrying, lifting, bending, sitting, standing, squatting, sleeping, stairs, transfers, bed mobility, hygiene/grooming, locomotion level, and caring for others  PARTICIPATION LIMITATIONS: meal prep, cleaning, interpersonal relationship, driving, shopping, community activity, and  occupation  PERSONAL FACTORS: 1-2 comorbidities: RA, shoulder pain  are also affecting patient's functional outcome.   REHAB POTENTIAL: Excellent  CLINICAL DECISION MAKING: Evolving/moderate complexity  EVALUATION COMPLEXITY: Moderate   GOALS: Goals reviewed with patient? Yes  SHORT TERM GOALS: Target date: 09/26/2023   Pt will be I with HEP for basic Rt knee ROM and strength Baseline:given on eval  Goal status: MET   2.  Pt will be able to tolerate seated flexion to > 75 deg for improved transfers  Baseline: limited due to pain and post surgical 09/04/23: >90  Goal status: MET  3.  Pt will be able to perform SLR x 10 in order to progress to ambulation without brace Baseline: unable  09/04/23: 8 reps with rest Goal status: MET 10/22/23    LONG TERM GOALS: Target date: 12/13/2023    Pt will be I with HEP for closed chain strength and stability, core.  Baseline: unknown  Goal status: ONGOING  2.  Pt will improve AROM to 90 deg in standing for functional strength in squat position (120 deg flexion by week 6)  Baseline: see above  10/22/23: 130d Goal status: MET  3.  Pt will achieve full extension in Rt knee for more normalized gait pattern Baseline: -10 deg  10/22/23: 0d Goal status: MET  4.  LEFS score will improve to 20/80 to work towards goals and full function Baseline: 6/80  Goal status: MET  6.  Pt will ambulate without noticeable limp in the community > 500 feet Baseline: knee in KI, no crutches  Goal status: MET   7. Pt will be able to light jogging for 15 min without increased knee pain   Baseline: 12/10/23: completed 5 minutes  Goal status: ONGOING   8. Pt will be able to stand on Rt LE for min to mod dynamic exercises with good control and no pain .    Baseline:  Goal status: NEW    PLAN:  PT FREQUENCY: 2 x/week then 2-3 x per week after 4 weeks (8-12 week POC) , add lateral walks, monster walks and lunges next visit. Begin walking program  PT  DURATION: 8 weeks  PLANNED INTERVENTIONS: 97164- PT Re-evaluation, 97110-Therapeutic exercises, 97530- Therapeutic activity, [id number]- Neuromuscular re-education, 97535- Self Care, 16109- Manual therapy, [id number]- Gait training, [phone]- Aquatic Therapy, 97014- Electrical stimulation (unattended), 97016- Vasopneumatic device, Patient/Family education, Balance training, Stair training, Taping, Joint mobilization, Manual lymph drainage, Scar mobilization, DME instructions, Cryotherapy, and Moist heat   PLAN FOR NEXT SESSION: CKC lunge , lateral walking and squat/RDL  See [email or web address] for full protocol.   Gasper Karst, PTA 12/18/23 11:38 AM Phone: [phone] Fax: [phone]

## 2023-12-19 NOTE — Therapy (Unsigned)
 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 phalanx   GERD (gastroesophageal reflux disease)    Inguinal hernia    Pinched nerve in neck    Rheumatoid arthritis [facility])    Past Surgical History:  Procedure Laterality Date   FINGER SURGERY     INGUINAL HERNIA REPAIR Left 10/07/2015   Procedure: LAPAROSCOPIC LEFT  INGUINAL HERNIA;  Surgeon: Shela Derby, MD;  Location: [facility];  Service: General;  Laterality: Left;   INSERTION OF MESH Left 10/07/2015   Procedure: INSERTION OF MESH;  Surgeon: Shela Derby, MD;  Location: [facility];  Service: General;  Laterality: Left;   Patient Active Problem List   Diagnosis Date Noted   Unspecified rotator cuff tear or rupture of right shoulder, not specified as traumatic 10/12/2022   Patellofemoral crepitus 10/12/2022   Seronegative rheumatoid arthritis (HCC) 03/28/2022   High risk medication use 03/28/2022   Educated about COVID-19 virus infection 08/06/2019   Precordial chest pain 06/20/2019   Nonspecific abnormal electrocardiogram (ECG) (EKG) 06/20/2019   Bilateral carpal tunnel syndrome 01/10/2018   Laryngopharyngeal reflux (LPR) 11/04/2017   Persistent hoarseness 11/04/2017    PCP: Annabell Key, [place]  [place]  REFERRING PROVIDER: [name]   REFERRING DIAG: Right knee arthroscopy with ACL reconstruction with allograft, 1/30  THERAPY DIAG:  No diagnosis found.  Rationale for Evaluation and Treatment: Rehabilitation  ONSET DATE: 08/22/23  SUBJECTIVE:   SUBJECTIVE STATEMENT:  12/18/23: Pt reports compliance with HEP, limited reps, notes anterior distal knee  pain and some lateral lower leg pain.   12/10/23: Pt reports limited compliance with formal HEP however has been active with delivery jobs, stairs and has tried some light jogging.    11/20/23: Pain is 0/10. Pt concerned about how weak his leg is.  He still wants to be able to run.  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  EVAL: Pt injured knee while playing football on 06/20/23. Reports that he was playing football when he planted his foot and went to change directions which time he felt a pop and sudden pain in the right knee. Eventually he underwent ACL repair by Dr. [name],( Right knee arthroscopy with ACL reconstruction with allograft  1/30) and is seeing him today for f/u. He is WBAT. Using KI at all times.  Not working on ROM yet. He has a lot of pain. No red flags.  He has been seeing MD for shoulder pain but that has been put on hold.   PERTINENT HISTORY: RA, shoulder pain  Date of Surgery: 08/22/2023 Right knee arthroscopy with ACL reconstruction with allograft   PAIN:  Are you having pain? Yes: NPRS scale: 0/10 Pain location: R knee A/P  Pain description: throbbing, sore Aggravating factors: dependent position, no knee support, moving it  Relieving factors: pillows, cold pack  PRECAUTIONS: Other: protocol  RED FLAGS: None   WEIGHT BEARING RESTRICTIONS: Yes WBAT  FALLS:  Has patient fallen in last 6 months? No  LIVING ENVIRONMENT: Lives with: lives alone Lives in:  House/apartment Stairs: Yes: External: 5-6 steps; can reach both Has following equipment at home: Crutches  OCCUPATION: Was a [employer].    PLOF: Independent  PATIENT GOALS: I want to get back to my old self, active and pain free   NEXT MD VISIT: Today   OBJECTIVE:  Note: Objective measures were completed at Evaluation unless otherwise noted.  DIAGNOSTIC FINDINGS: not available MRI was reviewed and demonstrates an osteochondral lesion  consistent with a bone bruise and an ACL tear.     PATIENT SURVEYS:  LEFS 6/80   11/20/23: 41/80    COGNITION: Overall cognitive status: Within functional limits for tasks assessed     SENSATION: WFL  EDEMA:  Circumferential: Rt 42 cm and Lt. 39 cm   MUSCLE LENGTH: NT   POSTURE: weight shift left guarded   PALPATION: Did not tolerate patellar mobilization , pain and tender to touch   LOWER EXTREMITY ROM:  Active ROM Right eval Left eval Right 09/04/23 Right 09/13/23 RT 10/22/23 Rt. 11/20/23 RT 12/18/23  Hip flexion         Hip extension         Hip abduction         Hip adduction         Hip internal rotation         Hip external rotation         Knee flexion 45 supine 50 seated   100 supine A 115 supine AA 125 A 130 A 128 130  Knee extension -10   -5  -3 0 0   Ankle dorsiflexion         Ankle plantarflexion         Ankle inversion         Ankle eversion          (Blank rows = not tested)  LOWER EXTREMITY MMT: NT due to acuity  MMT Right 11/20/23  Hip flexion 5  Hip extension   Hip abduction 5  Hip adduction   Hip internal rotation   Hip external rotation   Knee flexion 5  Knee extension 4+/5 shaky  Ankle dorsiflexion 5  Ankle plantarflexion   Ankle inversion   Ankle eversion    (Blank rows = not tested)  LOWER EXTREMITY SPECIAL TESTS:  NT   FUNCTIONAL TESTS:  None on eval   GAIT: Distance walked: 75  Assistive device utilized: knee immobilizer  Level of assistance: Modified independence Comments: WBAT no device leans to L with knee immobilizer                                                                                                                               TREATMENT DATE:   [facility] Adult PT Treatment:  DATE: 12/20/23 Therapeutic Exercise: *** Manual Therapy: *** Neuromuscular re-ed: *** Therapeutic Activity: *** Modalities: *** Self Care: ***  [facility] Adult PT Treatment:                                                 DATE: 12/18/23 Therapeutic Exercise: Hip flexor stretch with strap pull H/ s stretch TPR right peroneal Ankle eversion bilateral with RTB x 15  Therapeutic Activity: 2 min walking on T.M. warm up 3.0 MPH T.M jogging 5 minutes @ 5.0 MPH  Dead lift with Bar 45# 2 sets of 8 (limited due to time)  Dynamic and static lunges - experiencing knee pain with dynamic lunge -mod cues     [facility] Adult PT Treatment:                                                DATE: 12/10/23  Therapeutic Activity: 3 min walking on T.M. warm up 3.0 MPH T.M jogging 5 minutes @ 5.0 MPH  Dead lift with Bar 45# 4 sets of 8 Bulgarian split squat 3 sets of 8 - 1 UE assist Leg press bilat 100# 3 sets of 8      [facility] Adult PT Treatment:                                                DATE: 11/20/23 Therapeutic Activity: Elliptical 2.5  mins forward/backwards R0 L5 SLS 30 sec with mild fatigue and shaking Squat cues for weight in heels SLR x 10  Bridge x 1 hold 30 sec  SL bridge 10 sec hold x 5 Rt LE  Runners Step Up 15 lbs x 2 sets (wgt in Rt then L)  Split squat (tap knee on Airex)  Lunge x 10 (dynamic, single then alternating) 2 sets  Self Care: [facility]CK vs [facility]  Walking for fitness vs just being "up on your feet" LE alignment and control with closed chain exercises   [facility] Adult PT Treatment:             12 weeks                                   DATE: 11/15/23 Therapeutic Activity: Elliptical 2.5  mins forward/backwards R0 L5 Standing side hip abdct 10 x 2, 2.5# on airex Standing side hip 10 x 2, 2.5# on airex Slow Marching to 90d 2x20 Wall squat , small ROM hold 30 sec x 3  Cable walks forward, backward, and lateral out/back x8 each 20#  [facility] Adult PT Treatment:        12 weeks                                         DATE: 11/13/23 Therapeutic Activity: Side hip abdct 10 x 2, 2# Side clam 10 x 2 GTB  Bridge with band GTB SLR 1 x 15 10 inch step up Right with  1 UE  support x 10 Squat taps to step  10 x 2  RDL 15# KB 10 x 2  Rebounder with SLS on oval  Single leg RDL 2 x 10 AROM, need UE  Right SL leg press 40# 2 x 10 , CYBEX Wall squat , small ROM hold 30 sec x 2    [facility] Adult PT Treatment:        11 weeks                                         DATE: 11/06/23 Therapeutic Activity: Side hip abdct 10 x 3 Side clam 10 x 3  SLR x 10 Right SL leg press 45# x 10  6 inch step up Right with 1 UE support  Squat taps to step  10 x 2  SLS 48 seconds  Rebounder toss , 10 tosses without LOB OVAL SLS  Rebounder with SLS on oval  Single leg RDL x 10 AROM Wall squat , small ROM hold 30 sec x 2  Updated HEP    PATIENT EDUCATION:  Education details: importance of edema management, avoid dependent position prolonged (school) , slow progress with return to walking  Person educated: Patient Education method: Programmer, multimedia, Demonstration, Verbal cues, and Handouts Education comprehension: verbalized understanding, returned demonstration, and needs further education  HOME EXERCISE PROGRAM: Access Code: [email or web address] URL: [email or web address] Date: 10/22/2023 Prepared by: Liborio Reeds  Exercises - Supine Quad Set  - 1 x daily - 7 x weekly - 2 sets - 10 reps - 5-10 hold - Seated Passive Knee Extension  - 1 x daily - 7 x weekly - 1 sets - 5 reps - 30 hold - Seated Knee Flexion AAROM  - 3-5 x daily - 7 x weekly - 1 sets - 10 reps - 15-20 hold - Supine Ankle Pumps  - 3-5 x daily - 7 x weekly - 2 sets - 10 reps - Sidelying Hip Abduction  - 1 x daily - 7 x weekly - 2 sets - 10 reps - Sit to Stand Without Arm Support  - 1 x daily - 7 x weekly - 2 sets - 10 reps - Wall Squat  - 1 x daily - 7 x weekly - 1 sets - 15 reps - Clamshell  - 1 x daily - 7 x weekly - 3 sets - 10 reps - Crossover Step Up with Knee Drive  - 1 x daily - 7 x weekly - 3 sets - 10 reps - Single leg dead lit- use counter support  - 1 x daily - 7 x weekly - 2-3 sets - 10 reps - Side  Stepping with Resistance at Thighs  - 1 x daily - 7 x weekly - 1-2 sets - 10 reps  ASSESSMENT:  CLINICAL IMPRESSION: Patient is at 16 weeks post op. Has been more compliant with recent HEP this week, although less reps. Notes some distal anterior knee pain and lateral knee pain. Tender along peroneals. Performed TPR along peroneals and activation with ankle ER with resistance. Has done some light jogging. 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 Able to continue jogging on T.M for 5 minutes with no increased pain. Continued with independent gym program on page 3 of protocol and had limited time in session to complete sets. Worked on static and dynamic lunge which took most of today's session time.  He did well with min/mod cues for form throughout and did experience increased knee pain with dynamic lunge.  Pt will continue to benefit from skilled PT to address impairments for improved R knee/LE function. He would like to focus on his gym program rather than jogging at future session. Encouraged stretching quad and icing more often.   EVAL: Patient is a 39 y.o. male who was seen today for physical therapy evaluation and treatment for Rt knee ACL recon with tib ant allograft.    OBJECTIVE IMPAIRMENTS: Abnormal gait, decreased activity tolerance, decreased knowledge of use of DME, decreased mobility, difficulty walking, decreased ROM, decreased strength, decreased safety awareness, increased edema, increased fascial restrictions, and pain.   ACTIVITY LIMITATIONS: carrying, lifting, bending, sitting, standing, squatting, sleeping, stairs, transfers, bed mobility, hygiene/grooming, locomotion level, and caring for others  PARTICIPATION LIMITATIONS: meal prep, cleaning, interpersonal relationship, driving, shopping, community activity, and occupation  PERSONAL FACTORS: 1-2 comorbidities: RA, shoulder pain  are also affecting patient's functional outcome.   REHAB POTENTIAL:  Excellent  CLINICAL DECISION MAKING: Evolving/moderate complexity  EVALUATION COMPLEXITY: Moderate   GOALS: Goals reviewed with patient? Yes  SHORT TERM GOALS: Target date: 09/26/2023   Pt will be I with HEP for basic Rt knee ROM and strength Baseline:given on eval  Goal status: MET   2.  Pt will be able to tolerate seated flexion to > 75 deg for improved transfers  Baseline: limited due to pain and post surgical 09/04/23: >90  Goal status: MET  3.  Pt will be able to perform SLR x 10 in order to progress to ambulation without brace Baseline: unable  09/04/23: 8 reps with rest Goal status: MET 10/22/23    LONG TERM GOALS: Target date: 12/13/2023    Pt will be I with HEP for closed chain strength and stability, core.  Baseline: unknown  Goal status: ONGOING  2.  Pt will improve AROM to 90 deg in standing for functional strength in squat position (120 deg flexion by week 6)  Baseline: see above  10/22/23: 130d Goal status: MET  3.  Pt will achieve full extension in Rt knee for more normalized gait pattern Baseline: -10 deg  10/22/23: 0d Goal status: MET  4.  LEFS score will improve to 20/80 to work towards goals and full function Baseline: 6/80  Goal status: MET  6.  Pt will ambulate without noticeable limp in the community > 500 feet Baseline: knee in KI, no crutches  Goal status: MET   7. Pt will be able to light jogging for 15 min without increased knee pain   Baseline: 12/10/23: completed 5 minutes  Goal status: ONGOING   8. Pt will be able to stand on Rt LE for min to mod dynamic exercises with good control and no pain .    Baseline:  Goal status: NEW    PLAN:  PT FREQUENCY: 2 x/week then 2-3 x per week after 4 weeks (8-12 week POC) , add lateral walks, monster walks and lunges next visit. Begin walking program  PT DURATION: 8 weeks  PLANNED INTERVENTIONS: 97164- PT Re-evaluation, 97110-Therapeutic exercises, 97530- Therapeutic activity, [id number]-  Neuromuscular re-education, 97535- Self Care, 16109- Manual therapy, [id number]- Gait training, [phone]- Aquatic Therapy, 97014- Electrical stimulation (unattended), 97016- Vasopneumatic device, Patient/Family education, Balance training, Stair training, Taping, Joint mobilization, Manual lymph drainage, Scar mobilization, DME instructions, Cryotherapy, and Moist heat   PLAN FOR NEXT SESSION: CKC lunge , lateral walking and squat/RDL  See [email or web address] for full protocol.  Gasper Karst, PTA 12/19/23 1:01 PM Phone: [phone] Fax: [phone]

## 2023-12-20 ENCOUNTER — Ambulatory Visit

## 2023-12-20 ENCOUNTER — Ambulatory Visit: Admitting: Physical Therapy

## 2023-12-20 DIAGNOSIS — Z4789 Encounter for other orthopedic aftercare: Secondary | ICD-10-CM | POA: Diagnosis not present

## 2023-12-20 DIAGNOSIS — R262 Difficulty in walking, not elsewhere classified: Secondary | ICD-10-CM

## 2023-12-20 DIAGNOSIS — M25561 Pain in right knee: Secondary | ICD-10-CM

## 2023-12-20 DIAGNOSIS — M25661 Stiffness of right knee, not elsewhere classified: Secondary | ICD-10-CM

## 2023-12-20 DIAGNOSIS — R600 Localized edema: Secondary | ICD-10-CM

## 2023-12-20 NOTE — Therapy (Signed)
 OUTPATIENT PHYSICAL THERAPY LOWER EXTREMITY TREATMENT   Patient Name: Steve Flores MRN: [id number] DOB:08/22/1985, 38 y.o., male Today's Date: 12/20/2023  END OF SESSION:  PT End of Session - 12/20/23 1420     Visit Number 14    Number of Visits 27    Date for PT Re-Evaluation 01/16/24    Authorization Type [employer]    Authorization - Visit Number 14    Authorization - Number of Visits 27    PT Start Time 1145    PT Stop Time 1227    PT Time Calculation (min) 42 min    Activity Tolerance Patient tolerated treatment well    Behavior During Therapy WFL for tasks assessed/performed                    Past Medical History:  Diagnosis Date   Abrasion of finger of right hand 12/28/2011   healing wound right index finger   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 phalanx   GERD (gastroesophageal reflux disease)    Inguinal hernia    Pinched nerve in neck    Rheumatoid arthritis [facility])    Past Surgical History:  Procedure Laterality Date   FINGER SURGERY     INGUINAL HERNIA REPAIR Left 10/07/2015   Procedure: LAPAROSCOPIC LEFT  INGUINAL HERNIA;  Surgeon: Shela Derby, MD;  Location: [facility];  Service: General;  Laterality: Left;   INSERTION OF MESH Left 10/07/2015   Procedure: INSERTION OF MESH;  Surgeon: Shela Derby, MD;  Location: [facility];  Service: General;  Laterality: Left;   Patient Active Problem List   Diagnosis Date Noted   Unspecified rotator cuff tear or rupture of right shoulder, not specified as traumatic 10/12/2022   Patellofemoral crepitus 10/12/2022   Seronegative rheumatoid arthritis (HCC) 03/28/2022   High risk medication use 03/28/2022   Educated about COVID-19 virus infection 08/06/2019   Precordial chest pain 06/20/2019   Nonspecific abnormal electrocardiogram (ECG) (EKG) 06/20/2019   Bilateral carpal tunnel syndrome 01/10/2018   Laryngopharyngeal reflux (LPR) 11/04/2017    Persistent hoarseness 11/04/2017    PCP: Annabell Key, [place]  [place]  REFERRING PROVIDER: [name]   REFERRING DIAG: Right knee arthroscopy with ACL reconstruction with allograft, 1/30  THERAPY DIAG:  Aftercare for anterior cruciate ligament (ACL) repair  Acute pain of right knee  Localized edema  Stiffness of right knee, not elsewhere classified  Difficulty in walking, not elsewhere classified  Rationale for Evaluation and Treatment: Rehabilitation  ONSET DATE: 08/22/23  SUBJECTIVE:   SUBJECTIVE STATEMENT:  12/20/23: Pt reports his R knee bothered him after running the TM the last PT session  12/18/23: Pt reports compliance with HEP, limited reps, notes anterior distal knee pain and some lateral lower leg pain.   12/10/23: Pt reports limited compliance with formal HEP however has been active with delivery jobs, stairs and has tried some light jogging.   EVAL: Pt injured knee while playing football on 06/20/23. Reports that he was playing football when he planted his foot and went to change directions which time he felt a pop and sudden pain in the right knee. Eventually he underwent ACL repair by Dr. [name],( Right knee arthroscopy with ACL reconstruction with allograft  1/30) and is seeing him today for f/u. He is WBAT. Using KI at all times.  Not working on ROM yet. He has a lot of pain. No red flags.  He has been seeing MD for shoulder pain but that has been put on hold.   PERTINENT HISTORY: RA, shoulder pain  Date of Surgery: 08/22/2023 Right knee arthroscopy with ACL reconstruction with allograft   PAIN:  Are you having pain? Yes: NPRS scale: 2/10 Pain location: R knee A/P  Pain description: throbbing, sore Aggravating factors: dependent position, no knee support, moving it  Relieving factors: pillows, cold pack  PRECAUTIONS: Other: protocol  RED FLAGS: None   WEIGHT BEARING RESTRICTIONS: Yes WBAT  FALLS:  Has patient fallen in last 6 months? No  LIVING  ENVIRONMENT: Lives with: lives alone Lives in: House/apartment Stairs: Yes: External: 5-6 steps; can reach both Has following equipment at home: Crutches  OCCUPATION: Was a [employer].    PLOF: Independent  PATIENT GOALS: I want to get back to my old self, active and pain free   NEXT MD VISIT: Today   OBJECTIVE:  Note: Objective measures were completed at Evaluation unless otherwise noted.  DIAGNOSTIC FINDINGS: not available MRI was reviewed and demonstrates an osteochondral lesion consistent with a bone bruise and an ACL tear.     PATIENT SURVEYS:  LEFS 6/80   11/20/23: 41/80    COGNITION: Overall cognitive status: Within functional limits for tasks assessed     SENSATION: WFL  EDEMA:  Circumferential: Rt 42 cm and Lt. 39 cm   MUSCLE LENGTH: NT   POSTURE: weight shift left guarded   PALPATION: Did not tolerate patellar mobilization , pain and tender to touch   LOWER EXTREMITY ROM:  Active ROM Right eval Left eval Right 09/04/23 Right 09/13/23 RT 10/22/23 Rt. 11/20/23 RT 12/18/23  Hip flexion         Hip extension         Hip abduction         Hip adduction         Hip internal rotation         Hip external rotation         Knee flexion 45 supine 50 seated   100 supine A 115 supine AA 125 A 130 A 128 130  Knee extension -10   -5  -3 0 0   Ankle dorsiflexion         Ankle plantarflexion         Ankle inversion         Ankle eversion          (Blank rows = not tested)  LOWER EXTREMITY MMT: NT due to acuity  MMT Right 11/20/23  Hip flexion 5  Hip extension   Hip abduction 5  Hip adduction   Hip internal rotation   Hip external rotation   Knee flexion 5  Knee extension 4+/5 shaky  Ankle dorsiflexion 5  Ankle plantarflexion   Ankle inversion   Ankle eversion    (Blank rows = not tested)  LOWER EXTREMITY SPECIAL TESTS:  NT   FUNCTIONAL TESTS:  None on eval   GAIT: Distance walked: 75  Assistive device utilized: knee immobilizer  Level of  assistance: Modified independence Comments: WBAT no device leans to L with knee immobilizer  TREATMENT DATE:  [facility] Adult PT Treatment:                                                DATE: 12/20/23 Therapeutic Exercise: Hip flexor stretch with strap pull H/S stretch Therapeutic Activity: 5 mins Rec Bike  RDL x10 no wt, 2x10 15# Split squats 2x10 each, UE assist. Greater difficulty with the L LE forward than R Hex bar hinged hip squat lift 3x8 75#  [facility] Adult PT Treatment:                                                DATE: 12/18/23 Therapeutic Exercise: Hip flexor stretch with strap pull H/ s stretch TPR right peroneal Ankle eversion bilateral with RTB x 15   Therapeutic Activity: 2 min walking on T.M. warm up 3.0 MPH T.M jogging 5 minutes @ 5.0 MPH  Dead lift with Bar 45# 2 sets of 8 (limited due to time)  Dynamic and static lunges - experiencing knee pain with dynamic lunge -mod cues    [facility] Adult PT Treatment:                                                DATE: 12/10/23 Therapeutic Activity: 3 min walking on T.M. warm up 3.0 MPH T.M jogging 5 minutes @ 5.0 MPH  Dead lift with Bar 45# 4 sets of 8 Bulgarian split squat 3 sets of 8 - 1 UE assist Leg press bilat 100# 3 sets of 8  [facility] Adult PT Treatment:                                                DATE: 11/20/23 Therapeutic Activity: Elliptical 2.5  mins forward/backwards R0 L5 SLS 30 sec with mild fatigue and shaking Squat cues for weight in heels SLR x 10  Bridge x 1 hold 30 sec  SL bridge 10 sec hold x 5 Rt LE  Runners Step Up 15 lbs x 2 sets (wgt in Rt then L)  Split squat (tap knee on Airex)  Lunge x 10 (dynamic, single then alternating) 2 sets  Self Care: CCK vs OK[facility]  Walking for fitness vs just being "up on your feet" LE alignment and control with closed chain exercises   PATIENT  EDUCATION:  Education details: importance of edema management, avoid dependent position prolonged (school) , slow progress with return to walking  Person educated: Patient Education method: Explanation, Demonstration, Verbal cues, and Handouts Education comprehension: verbalized understanding, returned demonstration, and needs further education  HOME EXERCISE PROGRAM: Access Code: [email or web address] URL: [email or web address] Date: 10/22/2023 Prepared by: Liborio Reeds  Exercises - Supine Quad Set  - 1 x daily - 7 x weekly - 2 sets - 10 reps - 5-10 hold - Seated Passive Knee Extension  - 1 x daily - 7 x weekly - 1 sets - 5 reps - 30 hold - Seated Knee Flexion AAROM  - 3-5 x daily -  7 x weekly - 1 sets - 10 reps - 15-20 hold - Supine Ankle Pumps  - 3-5 x daily - 7 x weekly - 2 sets - 10 reps - Sidelying Hip Abduction  - 1 x daily - 7 x weekly - 2 sets - 10 reps - Sit to Stand Without Arm Support  - 1 x daily - 7 x weekly - 2 sets - 10 reps - Wall Squat  - 1 x daily - 7 x weekly - 1 sets - 15 reps - Clamshell  - 1 x daily - 7 x weekly - 3 sets - 10 reps - Crossover Step Up with Knee Drive  - 1 x daily - 7 x weekly - 3 sets - 10 reps - Single leg dead lit- use counter support  - 1 x daily - 7 x weekly - 2-3 sets - 10 reps - Side Stepping with Resistance at Thighs  - 1 x daily - 7 x weekly - 1-2 sets - 10 reps  ASSESSMENT:  CLINICAL IMPRESSION: Pt reports his R knee bothered him after running on the tread mill the last PT session. PT was completed LE strengthening. Pt had more difficulty with split squats with the L leg in the front/higher demand position than he did with the R. Pt tolerated hinged hip hex bar squats with increased demand today. Pt will continue to benefit from skilled PT to address impairments for improved R knee/LE function   EVAL: Patient is a 38 y.o. male who was seen today for physical therapy evaluation and treatment for Rt knee ACL recon with tib ant allograft.     OBJECTIVE IMPAIRMENTS: Abnormal gait, decreased activity tolerance, decreased knowledge of use of DME, decreased mobility, difficulty walking, decreased ROM, decreased strength, decreased safety awareness, increased edema, increased fascial restrictions, and pain.   ACTIVITY LIMITATIONS: carrying, lifting, bending, sitting, standing, squatting, sleeping, stairs, transfers, bed mobility, hygiene/grooming, locomotion level, and caring for others  PARTICIPATION LIMITATIONS: meal prep, cleaning, interpersonal relationship, driving, shopping, community activity, and occupation  PERSONAL FACTORS: 1-2 comorbidities: RA, shoulder pain  are also affecting patient's functional outcome.   REHAB POTENTIAL: Excellent  CLINICAL DECISION MAKING: Evolving/moderate complexity  EVALUATION COMPLEXITY: Moderate   GOALS: Goals reviewed with patient? Yes  SHORT TERM GOALS: Target date: 09/26/2023   Pt will be I with HEP for basic Rt knee ROM and strength Baseline:given on eval  Goal status: MET   2.  Pt will be able to tolerate seated flexion to > 75 deg for improved transfers  Baseline: limited due to pain and post surgical 09/04/23: >90  Goal status: MET  3.  Pt will be able to perform SLR x 10 in order to progress to ambulation without brace Baseline: unable  09/04/23: 8 reps with rest Goal status: MET 10/22/23    LONG TERM GOALS: Target date: 12/13/2023    Pt will be I with HEP for closed chain strength and stability, core.  Baseline: unknown  Goal status: ONGOING  2.  Pt will improve AROM to 90 deg in standing for functional strength in squat position (120 deg flexion by week 6)  Baseline: see above  10/22/23: 130d Goal status: MET  3.  Pt will achieve full extension in Rt knee for more normalized gait pattern Baseline: -10 deg  10/22/23: 0d Goal status: MET  4.  LEFS score will improve to 20/80 to work towards goals and full function Baseline: 6/80  Goal status: MET  6.  Pt will  ambulate without noticeable limp in the community > 500 feet Baseline: knee in KI, no crutches  Goal status: MET   7. Pt will be able to light jogging for 15 min without increased knee pain   Baseline: 12/10/23: completed 5 minutes  Goal status: ONGOING   8. Pt will be able to stand on Rt LE for min to mod dynamic exercises with good control and no pain .    Baseline:  Goal status: NEW    PLAN:  PT FREQUENCY: 2 x/week then 2-3 x per week after 4 weeks (8-12 week POC) , add lateral walks, monster walks and lunges next visit. Begin walking program  PT DURATION: 8 weeks  PLANNED INTERVENTIONS: 97164- PT Re-evaluation, 97110-Therapeutic exercises, 97530- Therapeutic activity, [id number]- Neuromuscular re-education, 97535- Self Care, 16109- Manual therapy, [id number]- Gait training, [id number]- Aquatic Therapy, 97014- Electrical stimulation (unattended), 97016- Vasopneumatic device, Patient/Family education, Balance training, Stair training, Taping, Joint mobilization, Manual lymph drainage, Scar mobilization, DME instructions, Cryotherapy, and Moist heat   PLAN FOR NEXT SESSION: CKC lunge , lateral walking and squat/RDL  See [email or web address] for full protocol.  Railee Bonillas MS, PT 12/20/23 2:25 PM

## 2023-12-24 ENCOUNTER — Ambulatory Visit: Attending: Orthopaedic Surgery | Admitting: Physical Therapy

## 2023-12-24 ENCOUNTER — Encounter: Payer: Self-pay | Admitting: Physical Therapy

## 2023-12-24 DIAGNOSIS — M25661 Stiffness of right knee, not elsewhere classified: Secondary | ICD-10-CM

## 2023-12-24 DIAGNOSIS — M25561 Pain in right knee: Secondary | ICD-10-CM

## 2023-12-24 DIAGNOSIS — R600 Localized edema: Secondary | ICD-10-CM

## 2023-12-24 DIAGNOSIS — R262 Difficulty in walking, not elsewhere classified: Secondary | ICD-10-CM | POA: Diagnosis present

## 2023-12-24 DIAGNOSIS — Z4789 Encounter for other orthopedic aftercare: Secondary | ICD-10-CM | POA: Diagnosis present

## 2023-12-24 NOTE — Therapy (Signed)
 OUTPATIENT PHYSICAL THERAPY LOWER EXTREMITY TREATMENT   Patient Name: Steve Flores MRN: [id number] DOB:13-Oct-1985, 38 y.o., male Today's Date: 12/24/2023  END OF SESSION:  PT End of Session - 12/24/23 1111     Visit Number 15    Number of Visits 27    Date for PT Re-Evaluation 01/16/24    Authorization Type [employer]    Authorization - Visit Number 15    Authorization - Number of Visits 27    PT Start Time 1107    PT Stop Time 1145    PT Time Calculation (min) 38 min    Activity Tolerance Patient tolerated treatment well    Behavior During Therapy WFL for tasks assessed/performed                     Past Medical History:  Diagnosis Date   Abrasion of finger of right hand 12/28/2011   healing wound right index finger   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 phalanx   GERD (gastroesophageal reflux disease)    Inguinal hernia    Pinched nerve in neck    Rheumatoid arthritis [facility])    Past Surgical History:  Procedure Laterality Date   FINGER SURGERY     INGUINAL HERNIA REPAIR Left 10/07/2015   Procedure: LAPAROSCOPIC LEFT  INGUINAL HERNIA;  Surgeon: Shela Derby, MD;  Location: [facility];  Service: General;  Laterality: Left;   INSERTION OF MESH Left 10/07/2015   Procedure: INSERTION OF MESH;  Surgeon: Shela Derby, MD;  Location: [facility];  Service: General;  Laterality: Left;   Patient Active Problem List   Diagnosis Date Noted   Unspecified rotator cuff tear or rupture of right shoulder, not specified as traumatic 10/12/2022   Patellofemoral crepitus 10/12/2022   Seronegative rheumatoid arthritis (HCC) 03/28/2022   High risk medication use 03/28/2022   Educated about COVID-19 virus infection 08/06/2019   Precordial chest pain 06/20/2019   Nonspecific abnormal electrocardiogram (ECG) (EKG) 06/20/2019   Bilateral carpal tunnel syndrome 01/10/2018   Laryngopharyngeal reflux (LPR)  11/04/2017   Persistent hoarseness 11/04/2017    PCP: Annabell Key, [place]  [place]  REFERRING PROVIDER: [name]   REFERRING DIAG: Right knee arthroscopy with ACL reconstruction with allograft, 1/30  THERAPY DIAG:  Aftercare for anterior cruciate ligament (ACL) repair  Acute pain of right knee  Localized edema  Stiffness of right knee, not elsewhere classified  Difficulty in walking, not elsewhere classified  Rationale for Evaluation and Treatment: Rehabilitation  ONSET DATE: 08/22/23  SUBJECTIVE:   SUBJECTIVE STATEMENT:  12/24/23: Saw Dr [name].  Pt recommended to stay on treadmill running (not road).  Knee ligament is stable.  Muscle still needs to be stronger. He's on  track.     EVAL: Pt injured knee while playing football on 06/20/23. Reports that he was playing football when he planted his foot and went to change directions which time he felt a pop and sudden pain in the right knee. Eventually he underwent ACL repair by Dr. [name],( Right knee arthroscopy with ACL reconstruction with allograft  1/30) and is seeing him today for f/u. He is WBAT. Using KI at all times.  Not working on ROM yet. He has a lot of pain. No red flags.  He has been seeing MD for shoulder pain but that has been put on hold.   PERTINENT HISTORY: RA, shoulder pain  Date of Surgery: 08/22/2023  Right knee arthroscopy with ACL reconstruction with allograft   PAIN:  Are you having pain? Yes: NPRS scale: none/10 Pain location: R knee A/P  Pain description: throbbing, sore Aggravating factors: dependent position, no knee support, moving it  Relieving factors: pillows, cold pack  PRECAUTIONS: Other: protocol  RED FLAGS: None   WEIGHT BEARING RESTRICTIONS: Yes WBAT  FALLS:  Has patient fallen in last 6 months? No  LIVING ENVIRONMENT: Lives with: lives alone Lives in: House/apartment Stairs: Yes: External: 5-6 steps; can reach both Has following equipment at home: Crutches  OCCUPATION: Was a  [employer].    PLOF: Independent  PATIENT GOALS: I want to get back to my old self, active and pain free   NEXT MD VISIT: Today   OBJECTIVE:  Note: Objective measures were completed at Evaluation unless otherwise noted.  DIAGNOSTIC FINDINGS: not available MRI was reviewed and demonstrates an osteochondral lesion consistent with a bone bruise and an ACL tear.     PATIENT SURVEYS:  LEFS 6/80   11/20/23: 41/80    COGNITION: Overall cognitive status: Within functional limits for tasks assessed     SENSATION: WFL  EDEMA:  Circumferential: Rt 42 cm and Lt. 39 cm   MUSCLE LENGTH: NT   POSTURE: weight shift left guarded   PALPATION: Did not tolerate patellar mobilization , pain and tender to touch   LOWER EXTREMITY ROM:  Active ROM Right eval Left eval Right 09/04/23 Right 09/13/23 RT 10/22/23 Rt. 11/20/23 RT 12/18/23  Hip flexion         Hip extension         Hip abduction         Hip adduction         Hip internal rotation         Hip external rotation         Knee flexion 45 supine 50 seated   100 supine A 115 supine AA 125 A 130 A 128 130  Knee extension -10   -5  -3 0 0   Ankle dorsiflexion         Ankle plantarflexion         Ankle inversion         Ankle eversion          (Blank rows = not tested)  LOWER EXTREMITY MMT: NT due to acuity  MMT Right 11/20/23  Hip flexion 5  Hip extension   Hip abduction 5  Hip adduction   Hip internal rotation   Hip external rotation   Knee flexion 5  Knee extension 4+/5 shaky  Ankle dorsiflexion 5  Ankle plantarflexion   Ankle inversion   Ankle eversion    (Blank rows = not tested)  LOWER EXTREMITY SPECIAL TESTS:  NT   FUNCTIONAL TESTS:  None on eval   GAIT: Distance walked: 75  Assistive device utilized: knee immobilizer  Level of assistance: Modified independence Comments: WBAT no device leans to L with knee immobilizer  TREATMENT DATE:   [facility] Adult PT Treatment:                                                DATE: 12/24/23 Therapeutic Exercise: Elliptical 10 ramp and 7 resist for 6 min  Forward lunge alt.  Reverse lunge alt slight hinge for glutes  Lateral lunge to SL balance  Monster walk 2 x 25 feet  Stagger sit to stand 15 lbs each side x 15 d B stance RDL 25 lbs 2 x 12 (holding in either side)  Double leg RDL (stiffer leg) 25 lbs  Single leg heel raise with 1 UE  x 15  Wall sit 30 sec 25 lbs quad push  Wall sit 60 sec 25 lbs, alt heel lift 30 sec iso each side  Wall sit 1 min 25 lbs alt heel lift  Calf stretch gastrocnemius each side x 30 sec x 2   PATIENT EDUCATION:  Education details: importance of edema management, avoid dependent position prolonged (school) , slow progress with return to walking  Person educated: Patient Education method: Programmer, multimedia, Demonstration, Verbal cues, and Handouts Education comprehension: verbalized understanding, returned demonstration, and needs further education  HOME EXERCISE PROGRAM: Access Code: [email or web address] URL: [email or web address] Date: 10/22/2023 Prepared by: Liborio Reeds  Exercises - Supine Quad Set  - 1 x daily - 7 x weekly - 2 sets - 10 reps - 5-10 hold - Seated Passive Knee Extension  - 1 x daily - 7 x weekly - 1 sets - 5 reps - 30 hold - Seated Knee Flexion AAROM  - 3-5 x daily - 7 x weekly - 1 sets - 10 reps - 15-20 hold - Supine Ankle Pumps  - 3-5 x daily - 7 x weekly - 2 sets - 10 reps - Sidelying Hip Abduction  - 1 x daily - 7 x weekly - 2 sets - 10 reps - Sit to Stand Without Arm Support  - 1 x daily - 7 x weekly - 2 sets - 10 reps - Wall Squat  - 1 x daily - 7 x weekly - 1 sets - 15 reps - Clamshell  - 1 x daily - 7 x weekly - 3 sets - 10 reps - Crossover Step Up with Knee Drive  - 1 x daily - 7 x weekly - 3 sets - 10 reps - Single leg dead lit- use counter support  - 1 x daily - 7 x  weekly - 2-3 sets - 10 reps - Side Stepping with Resistance at Thighs  - 1 x daily - 7 x weekly - 1-2 sets - 10 reps  ASSESSMENT:  CLINICAL IMPRESSION: Patient able to focus on more dynamic closed chain exercises in addition to isometric holds on the wall for quad resilience.  He needs consistent cues or keeping Rt heel down with lunging. He has good hinge form but lacks control with landing and pushing off, eccentric lowering. He had min pain in his knee and fatigue.  He will be reassessed next time and today expressed desire to cont PT if able.  He would like to able to run without pain but reports he does not enjoy running.   EVAL: Patient is a 38 y.o. male who was seen today for physical therapy evaluation and treatment for Rt knee ACL recon with tib ant allograft.  OBJECTIVE IMPAIRMENTS: Abnormal gait, decreased activity tolerance, decreased knowledge of use of DME, decreased mobility, difficulty walking, decreased ROM, decreased strength, decreased safety awareness, increased edema, increased fascial restrictions, and pain.   ACTIVITY LIMITATIONS: carrying, lifting, bending, sitting, standing, squatting, sleeping, stairs, transfers, bed mobility, hygiene/grooming, locomotion level, and caring for others  PARTICIPATION LIMITATIONS: meal prep, cleaning, interpersonal relationship, driving, shopping, community activity, and occupation  PERSONAL FACTORS: 1-2 comorbidities: RA, shoulder pain  are also affecting patient's functional outcome.   REHAB POTENTIAL: Excellent  CLINICAL DECISION MAKING: Evolving/moderate complexity  EVALUATION COMPLEXITY: Moderate   GOALS: Goals reviewed with patient? Yes  SHORT TERM GOALS: Target date: 09/26/2023   Pt will be I with HEP for basic Rt knee ROM and strength Baseline:given on eval  Goal status: MET   2.  Pt will be able to tolerate seated flexion to > 75 deg for improved transfers  Baseline: limited due to pain and post surgical 09/04/23:  >90  Goal status: MET  3.  Pt will be able to perform SLR x 10 in order to progress to ambulation without brace Baseline: unable  09/04/23: 8 reps with rest Goal status: MET 10/22/23    LONG TERM GOALS: Target date: 12/13/2023    Pt will be I with HEP for closed chain strength and stability, core.  Baseline: unknown  Goal status: ONGOING  2.  Pt will improve AROM to 90 deg in standing for functional strength in squat position (120 deg flexion by week 6)  Baseline: see above  10/22/23: 130d Goal status: MET  3.  Pt will achieve full extension in Rt knee for more normalized gait pattern Baseline: -10 deg  10/22/23: 0d Goal status: MET  4.  LEFS score will improve to 20/80 to work towards goals and full function Baseline: 6/80  Goal status: MET  6.  Pt will ambulate without noticeable limp in the community > 500 feet Baseline: knee in KI, no crutches  Goal status: MET   7. Pt will be able to light jogging for 15 min without increased knee pain   Baseline: 12/10/23: completed 5 minutes  Goal status: ONGOING   8. Pt will be able to stand on Rt LE for min to mod dynamic exercises with good control and no pain .    Baseline:  Goal status: NEW    PLAN:  PT FREQUENCY: 2 x/week then 2-3 x per week after 4 weeks (8-12 week POC) , add lateral walks, monster walks and lunges next visit. Begin walking program  PT DURATION: 8 weeks  PLANNED INTERVENTIONS: 97164- PT Re-evaluation, 97110-Therapeutic exercises, 97530- Therapeutic activity, [id number]- Neuromuscular re-education, 97535- Self Care, 16109- Manual therapy, [id number]- Gait training, [id number]- Aquatic Therapy, 97014- Electrical stimulation (unattended), 97016- Vasopneumatic device, Patient/Family education, Balance training, Stair training, Taping, Joint mobilization, Manual lymph drainage, Scar mobilization, DME instructions, Cryotherapy, and Moist heat   PLAN FOR NEXT SESSION: CKC lunge , lateral walking and squat/RDL  See [email or web address]  for full protocol.  Allen Ralls MS, PT 12/24/23 12:31 PM

## 2023-12-25 NOTE — Therapy (Signed)
 OUTPATIENT PHYSICAL THERAPY LOWER EXTREMITY TREATMENT   Patient Name: Steve Flores MRN: [id number] DOB:04-Feb-1986, 38 y.o., male Today's Date: 12/26/2023  END OF SESSION:  PT End of Session - 12/26/23 1105     Visit Number 16    Number of Visits 27    Date for PT Re-Evaluation 01/16/24    Authorization Type [employer]    Authorization - Visit Number 16    Authorization - Number of Visits 27    PT Start Time 1104    PT Stop Time 1145    PT Time Calculation (min) 41 min    Activity Tolerance Patient tolerated treatment well    Behavior During Therapy WFL for tasks assessed/performed                      Past Medical History:  Diagnosis Date   Abrasion of finger of right hand 12/28/2011   healing wound right index finger   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 phalanx   GERD (gastroesophageal reflux disease)    Inguinal hernia    Pinched nerve in neck    Rheumatoid arthritis [facility])    Past Surgical History:  Procedure Laterality Date   FINGER SURGERY     INGUINAL HERNIA REPAIR Left 10/07/2015   Procedure: LAPAROSCOPIC LEFT  INGUINAL HERNIA;  Surgeon: Shela Derby, MD;  Location: [facility];  Service: General;  Laterality: Left;   INSERTION OF MESH Left 10/07/2015   Procedure: INSERTION OF MESH;  Surgeon: Shela Derby, MD;  Location: [facility];  Service: General;  Laterality: Left;   Patient Active Problem List   Diagnosis Date Noted   Unspecified rotator cuff tear or rupture of right shoulder, not specified as traumatic 10/12/2022   Patellofemoral crepitus 10/12/2022   Seronegative rheumatoid arthritis (HCC) 03/28/2022   High risk medication use 03/28/2022   Educated about COVID-19 virus infection 08/06/2019   Precordial chest pain 06/20/2019   Nonspecific abnormal electrocardiogram (ECG) (EKG) 06/20/2019   Bilateral carpal tunnel syndrome 01/10/2018   Laryngopharyngeal reflux (LPR)  11/04/2017   Persistent hoarseness 11/04/2017    PCP: Annabell Key, [place]  [place]  REFERRING PROVIDER: [name]   REFERRING DIAG: Right knee arthroscopy with ACL reconstruction with allograft, 1/30  THERAPY DIAG:  Aftercare for anterior cruciate ligament (ACL) repair  Acute pain of right knee  Localized edema  Stiffness of right knee, not elsewhere classified  Difficulty in walking, not elsewhere classified  Rationale for Evaluation and Treatment: Rehabilitation  ONSET DATE: 08/22/23  SUBJECTIVE:   SUBJECTIVE STATEMENT:  12/26/23: Pt reports no R knee pain today.  EVAL: Pt injured knee while playing football on 06/20/23. Reports that he was playing football when he planted his foot and went to change directions which time he felt a pop and sudden pain in the right knee. Eventually he underwent ACL repair by Dr. [name],( Right knee arthroscopy with ACL reconstruction with allograft  1/30) and is seeing him today for f/u. He is WBAT. Using KI at all times.  Not working on ROM yet. He has a lot of pain. No red flags.  He has been seeing MD for shoulder pain but that has been put on hold.   PERTINENT HISTORY: RA, shoulder pain  Date of Surgery: 08/22/2023 Right knee arthroscopy with ACL reconstruction with allograft   PAIN:  Are you having pain? Yes: NPRS scale: none/10 Pain location: R knee  A/P  Pain description: throbbing, sore Aggravating factors: dependent position, no knee support, moving it  Relieving factors: pillows, cold pack  PRECAUTIONS: Other: protocol  RED FLAGS: None   WEIGHT BEARING RESTRICTIONS: Yes WBAT  FALLS:  Has patient fallen in last 6 months? No  LIVING ENVIRONMENT: Lives with: lives alone Lives in: House/apartment Stairs: Yes: External: 5-6 steps; can reach both Has following equipment at home: Crutches  OCCUPATION: Was a [employer].    PLOF: Independent  PATIENT GOALS: I want to get back to my old self, active and pain free   NEXT MD VISIT:  Today   OBJECTIVE:  Note: Objective measures were completed at Evaluation unless otherwise noted.  DIAGNOSTIC FINDINGS: not available MRI was reviewed and demonstrates an osteochondral lesion consistent with a bone bruise and an ACL tear.     PATIENT SURVEYS:  LEFS 6/80   11/20/23: 41/80    COGNITION: Overall cognitive status: Within functional limits for tasks assessed     SENSATION: WFL  EDEMA:  Circumferential: Rt 42 cm and Lt. 39 cm   MUSCLE LENGTH: NT   POSTURE: weight shift left guarded   PALPATION: Did not tolerate patellar mobilization , pain and tender to touch   LOWER EXTREMITY ROM:  Active ROM Right eval Left eval Right 09/04/23 Right 09/13/23 RT 10/22/23 Rt. 11/20/23 RT 12/18/23  Hip flexion         Hip extension         Hip abduction         Hip adduction         Hip internal rotation         Hip external rotation         Knee flexion 45 supine 50 seated   100 supine A 115 supine AA 125 A 130 A 128 130  Knee extension -10   -5  -3 0 0   Ankle dorsiflexion         Ankle plantarflexion         Ankle inversion         Ankle eversion          (Blank rows = not tested)  LOWER EXTREMITY MMT: NT due to acuity  MMT Right 11/20/23  Hip flexion 5  Hip extension   Hip abduction 5  Hip adduction   Hip internal rotation   Hip external rotation   Knee flexion 5  Knee extension 4+/5 shaky  Ankle dorsiflexion 5  Ankle plantarflexion   Ankle inversion   Ankle eversion    (Blank rows = not tested)  LOWER EXTREMITY SPECIAL TESTS:  NT   FUNCTIONAL TESTS:  None on eval   GAIT: Distance walked: 75  Assistive device utilized: knee immobilizer  Level of assistance: Modified independence Comments: WBAT no device leans to L with knee immobilizer  TREATMENT DATE:  [facility] Adult PT Treatment:                                                 DATE: 12/26/23 Therapeutic Activity: Elliptical 10 ramp and 12 resist 2.5 mins FWD/BWD  Leg press R LE 80# 3x8, L 130# 3x8. 100# both Con, R Ecc 2x8 FM cable walks FWD 27#, BWD 27#, side stepping 23# 6 reps for all  [facility] Adult PT Treatment:                                                DATE: 12/24/23 Therapeutic Exercise: Elliptical 10 ramp and 7 resist for 6 min  Forward lunge alt.  Reverse lunge alt slight hinge for glutes  Lateral lunge to SL balance  Monster walk 2 x 25 feet  Stagger sit to stand 15 lbs each side x 15 d B stance RDL 25 lbs 2 x 12 (holding in either side)  Double leg RDL (stiffer leg) 25 lbs  Single leg heel raise with 1 UE  x 15  Wall sit 30 sec 25 lbs quad push  Wall sit 60 sec 25 lbs, alt heel lift 30 sec iso each side  Wall sit 1 min 25 lbs alt heel lift  Calf stretch gastrocnemius each side x 30 sec x 2   PATIENT EDUCATION:  Education details: importance of edema management, avoid dependent position prolonged (school) , slow progress with return to walking  Person educated: Patient Education method: Programmer, multimedia, Demonstration, Verbal cues, and Handouts Education comprehension: verbalized understanding, returned demonstration, and needs further education  HOME EXERCISE PROGRAM: Access Code: [email or web address] URL: [email or web address] Date: 10/22/2023 Prepared by: Liborio Reeds  Exercises - Supine Quad Set  - 1 x daily - 7 x weekly - 2 sets - 10 reps - 5-10 hold - Seated Passive Knee Extension  - 1 x daily - 7 x weekly - 1 sets - 5 reps - 30 hold - Seated Knee Flexion AAROM  - 3-5 x daily - 7 x weekly - 1 sets - 10 reps - 15-20 hold - Supine Ankle Pumps  - 3-5 x daily - 7 x weekly - 2 sets - 10 reps - Sidelying Hip Abduction  - 1 x daily - 7 x weekly - 2 sets - 10 reps - Sit to Stand Without Arm Support  - 1 x daily - 7 x weekly - 2 sets - 10 reps - Wall Squat  - 1 x daily - 7 x weekly - 1 sets - 15 reps - Clamshell  - 1 x daily - 7 x weekly - 3  sets - 10 reps - Crossover Step Up with Knee Drive  - 1 x daily - 7 x weekly - 3 sets - 10 reps - Single leg dead lit- use counter support  - 1 x daily - 7 x weekly - 2-3 sets - 10 reps - Side Stepping with Resistance at Thighs  - 1 x daily - 7 x weekly - 1-2 sets - 10 reps  ASSESSMENT:  CLINICAL IMPRESSION: Completed strengthening and general assessment of R LE strength on the leg press. Pt estimated R leg resistance at 80# to be similar to 130# with  the L LE for 8 reps. PT was completed for CKC strengthening with pt tolerating the prescribed exercises without adverse effects.  Pt will continue to benefit from skilled PT to address R LE impairments for improved function.   EVAL: Patient is a 38 y.o. male who was seen today for physical therapy evaluation and treatment for Rt knee ACL recon with tib ant allograft.    OBJECTIVE IMPAIRMENTS: Abnormal gait, decreased activity tolerance, decreased knowledge of use of DME, decreased mobility, difficulty walking, decreased ROM, decreased strength, decreased safety awareness, increased edema, increased fascial restrictions, and pain.   ACTIVITY LIMITATIONS: carrying, lifting, bending, sitting, standing, squatting, sleeping, stairs, transfers, bed mobility, hygiene/grooming, locomotion level, and caring for others  PARTICIPATION LIMITATIONS: meal prep, cleaning, interpersonal relationship, driving, shopping, community activity, and occupation  PERSONAL FACTORS: 1-2 comorbidities: RA, shoulder pain  are also affecting patient's functional outcome.   REHAB POTENTIAL: Excellent  CLINICAL DECISION MAKING: Evolving/moderate complexity  EVALUATION COMPLEXITY: Moderate   GOALS: Goals reviewed with patient? Yes  SHORT TERM GOALS: Target date: 09/26/2023   Pt will be I with HEP for basic Rt knee ROM and strength Baseline:given on eval  Goal status: MET   2.  Pt will be able to tolerate seated flexion to > 75 deg for improved transfers  Baseline:  limited due to pain and post surgical 09/04/23: >90  Goal status: MET  3.  Pt will be able to perform SLR x 10 in order to progress to ambulation without brace Baseline: unable  09/04/23: 8 reps with rest Goal status: MET 10/22/23    LONG TERM GOALS: Target date: 12/13/2023    Pt will be I with HEP for closed chain strength and stability, core.  Baseline: unknown  Goal status: ONGOING  2.  Pt will improve AROM to 90 deg in standing for functional strength in squat position (120 deg flexion by week 6)  Baseline: see above  10/22/23: 130d Goal status: MET  3.  Pt will achieve full extension in Rt knee for more normalized gait pattern Baseline: -10 deg  10/22/23: 0d Goal status: MET  4.  LEFS score will improve to 20/80 to work towards goals and full function Baseline: 6/80  Goal status: MET  6.  Pt will ambulate without noticeable limp in the community > 500 feet Baseline: knee in KI, no crutches  Goal status: MET   7. Pt will be able to light jogging for 15 min without increased knee pain   Baseline: 12/10/23: completed 5 minutes  Goal status: ONGOING   8. Pt will be able to stand on Rt LE for min to mod dynamic exercises with good control and no pain .    Baseline:  Goal status: NEW    PLAN:  PT FREQUENCY: 2 x/week then 2-3 x per week after 4 weeks (8-12 week POC) , add lateral walks, monster walks and lunges next visit. Begin walking program  PT DURATION: 8 weeks  PLANNED INTERVENTIONS: 97164- PT Re-evaluation, 97110-Therapeutic exercises, 97530- Therapeutic activity, [id number]- Neuromuscular re-education, 97535- Self Care, 53664- Manual therapy, [id number]- Gait training, [phone]- Aquatic Therapy, 97014- Electrical stimulation (unattended), 97016- Vasopneumatic device, Patient/Family education, Balance training, Stair training, Taping, Joint mobilization, Manual lymph drainage, Scar mobilization, DME instructions, Cryotherapy, and Moist heat   PLAN FOR NEXT SESSION: CKC lunge ,  lateral walking and squat/RDL  See [email or web address] for full protocol.   Jalin Erpelding MS, PT 12/26/23 1:27 PM

## 2023-12-26 ENCOUNTER — Ambulatory Visit

## 2023-12-26 DIAGNOSIS — M25661 Stiffness of right knee, not elsewhere classified: Secondary | ICD-10-CM

## 2023-12-26 DIAGNOSIS — R600 Localized edema: Secondary | ICD-10-CM

## 2023-12-26 DIAGNOSIS — Z4789 Encounter for other orthopedic aftercare: Secondary | ICD-10-CM

## 2023-12-26 DIAGNOSIS — M25561 Pain in right knee: Secondary | ICD-10-CM

## 2023-12-26 DIAGNOSIS — R262 Difficulty in walking, not elsewhere classified: Secondary | ICD-10-CM

## 2024-02-11 ENCOUNTER — Telehealth: Payer: Self-pay | Admitting: Internal Medicine

## 2024-02-11 NOTE — Telephone Encounter (Signed)
 Patient called stating he has hasn't taken the Leflunomide  medication that Dr. Jeannetta prescribed due to having ACL surgery in January of 2025.  Patient states he has been noticing some increased pain and stiffness recently and is scheduled for an appointment on 02/25/24.  Patient states he plans to come in the office on Thursday, 02/13/24 to have labwork.  Patient requested a return call to discuss restarting the medication before his appointment.

## 2024-02-11 NOTE — Telephone Encounter (Signed)
 He will need to have labs drawn before resuming the leflunomide , since we have no recent labs on file since last September. If he comes by Thursday I can send a new Rx that day.

## 2024-02-11 NOTE — Telephone Encounter (Signed)
 Contacted the patient and advised He will need to have labs drawn before resuming the leflunomide , since we have no recent labs on file since last September. If he comes by Thursday Dr. Jeannetta can send a new Rx that day. Patient states he will be coming by early morning Friday for labs.   Patient also states he still has his original prescription of the leflunomide  and is not sure what to do with it. Patient inquired if he could still take that one as long as it is still in date. Please advise.

## 2024-02-13 ENCOUNTER — Other Ambulatory Visit: Payer: Self-pay

## 2024-02-13 DIAGNOSIS — Z79899 Other long term (current) drug therapy: Secondary | ICD-10-CM

## 2024-02-13 LAB — CBC WITH DIFFERENTIAL/PLATELET
Absolute Lymphocytes: 2091 {cells}/uL (ref 850–3900)
Absolute Monocytes: 719 {cells}/uL (ref 200–950)
Basophils Absolute: 31 {cells}/uL (ref 0–200)
Basophils Relative: 0.6 %
Eosinophils Absolute: 689 {cells}/uL — ABNORMAL HIGH (ref 15–500)
Eosinophils Relative: 13.5 %
HCT: 44.3 % (ref 38.5–50.0)
Hemoglobin: 14.6 g/dL (ref 13.2–17.1)
MCH: 28.1 pg (ref 27.0–33.0)
MCHC: 33 g/dL (ref 32.0–36.0)
MCV: 85.4 fL (ref 80.0–100.0)
MPV: 11.1 fL (ref 7.5–12.5)
Monocytes Relative: 14.1 %
Neutro Abs: 1571 {cells}/uL (ref 1500–7800)
Neutrophils Relative %: 30.8 %
Platelets: 190 Thousand/uL (ref 140–400)
RBC: 5.19 Million/uL (ref 4.20–5.80)
RDW: 14.1 % (ref 11.0–15.0)
Total Lymphocyte: 41 %
WBC: 5.1 Thousand/uL (ref 3.8–10.8)

## 2024-02-13 LAB — COMPREHENSIVE METABOLIC PANEL WITH GFR
AG Ratio: 1.1 (calc) (ref 1.0–2.5)
ALT: 15 U/L (ref 9–46)
AST: 21 U/L (ref 10–40)
Albumin: 4 g/dL (ref 3.6–5.1)
Alkaline phosphatase (APISO): 55 U/L (ref 36–130)
BUN: 11 mg/dL (ref 7–25)
CO2: 27 mmol/L (ref 20–32)
Calcium: 9.2 mg/dL (ref 8.6–10.3)
Chloride: 105 mmol/L (ref 98–110)
Creat: 1.15 mg/dL (ref 0.60–1.26)
Globulin: 3.6 g/dL (ref 1.9–3.7)
Glucose, Bld: 94 mg/dL (ref 65–99)
Potassium: 4 mmol/L (ref 3.5–5.3)
Sodium: 139 mmol/L (ref 135–146)
Total Bilirubin: 0.5 mg/dL (ref 0.2–1.2)
Total Protein: 7.6 g/dL (ref 6.1–8.1)
eGFR: 84 mL/min/1.73m2 (ref >=60)

## 2024-02-19 NOTE — Progress Notes (Signed)
 Office Visit Note  Patient: Steve Flores             Date of Birth: Sep 22, 1985           MRN: [id number]             PCP: Cleotilde Torrie BRAVO, PA Referring: Cleotilde Townsend BRAVO, PA Visit Date: 02/25/2024   Subjective:  Follow-up (Discuss [place]  and labs)   Discussed the use of AI scribe software for clinical note transcription with the patient, who gave verbal consent to proceed.  History of Present Illness   Steve Flores is a 38 y.o. male here for follow up follow up for seronegative RA    He underwent ACL repair surgery in January and has been participating in physical therapy for a couple of months. He experiences stiffness in his knee and other joints, which serves as a reminder to take his rheumatoid arthritis medication. However, he has not resumed his medication post-surgery, preferring to allow his body to reacclimate.  He is curious about his rheumatoid arthritis activity levels, particularly the enzymes or markers in his blood, due to the extended period before restarting his medication. His sedimentation rate had been elevated previously, but it was not checked in the recent labs.  He has not started the prescribed medication, leflunomide , due to the timing of his surgery and subsequent pain medication use. He is concerned about any precautions needed before starting the medication again.  He reports a high eosinophil count in his recent blood test, which he attributes to taking ibuprofen  for back pain around the time of his last visit. No typical allergy or asthma symptoms are present, and he does not frequently use antihistamines.  He experiences swelling in his fingers, which has been intermittent for the past few weeks to a month. His fingers can turn blue or [place] and get cold quickly, but he does not report any significant circulation problems. No new rashes or significant changes in his condition are noted.       Previous HPI 04/19/2023 Steve Flores is a 38 y.o.  male here for follow up for seronegative RA on methotrexate  on 20 mg [place] weekly and folic acid  1 mg daily.  Since her last visit he has seen orthopedic surgery regarding partial-thickness right shoulder rotator cuff tear.  Symptoms have continued limiting him with severe pain raising his shoulder.  He had limited response to steroid injection and limited progress with strengthening rehab exercises.  Now scheduled for arthroscopic surgery upcoming in October.  He continues to have fair amount of side effects with the methotrexate  with fatigue and headaches after taking every medication dose.  He was off the methotrexate  for about a 1 month interruption did notice a difference in his arthritis.  But when he resumed the medication all of the side effects were more severe again like when he for started subsequently stopped it due to symptoms were so bad was causing him stress even anticipating them.   Previous HPI 01/16/2023 Steve Flores is a 38 y.o. male here for follow up for seronegative RA on methotrexate  on 20 mg [place] weekly and folic acid  1 mg daily. Symptoms are doing pretty well overall. Still has pain and stiffness more noticeable after increased use. Without visible swelling. Having less issue with fatigue after methotrexate  doses still present but returns to baseline in between.   Previous HPI 10/12/22 Steve Flores is a 38 y.o. male here for follow up for seronegative RA  on methotrexate  20 mg subcu weekly folic acid  1 mg daily.  Overall symptoms are partially improved compared to last visit.  Still having some intolerance to methotrexate  but not as bad.  Had steroid injection in the right shoulder with symptom benefit with [name] about 2 weeks ago.  Still taking meloxicam last refill in January so not on every day.  He is very fatigued today associated with less sleep pain for midterms studying in [employer] with [employer].  He has been noticing some more clicking or popping sensation  affecting his knees while climbing stairs.   Previous HPI 07/06/22 Steve Flores is a 38 y.o. male here for follow up for seronegative RA on MTX 15 mg [place] weekly and folic acid  1 mg daily and probable bilateral carpal tunnel syndrome.  Does feel a benefit in his symptoms the swelling is much better though he still has joint pain and stiffness pretty frequently.  He has noticed a pattern of more symptom improvement about every other week after his methotrexate  dose but not as much on the other half of doses.  He notices pain in both shoulders.  Feels decreased grip strength in his hands worse on the right side, often have more pain after activities like basketball.  He feels some weakness in his knees and but not really painful and does not see any swelling.   Previous HPI 04/27/22 Steve Flores is a 38 y.o. male here for follow up for seronegative RA on MTX 15 mg [place] weekly and folic acid  1 mg daily. He delayed starting the medication until start of this week due to concern about side effects. He was also discussing adjustment to antidepressant medication and concern about significant fatigue. He did notice this worse but not sure if related to the medication. No GI side effects or injection site rash. On review of the medication had concern about hair loss or fertility side effects.   Previous HPI 03/28/2022 Steve Flores is a 38 y.o. male here for evaluation and management of inflammatory arthritis, previously seen at [facility] rheumatology with inflammatory changes in wrists, MCPs, and PIPs. He has not been consistently established with medical insurance limiting original diagnosis and treatment.  He developed the joint inflammation symptoms starting since around 2017.  He saw [facility] rheumatology work-up at that time with positive RNP and SSA antibodies with proximal hand swelling and was recommended to start treatment.  He never actually got onto the medicine which appears to have been  subcutaneous methotrexate  due to insurance issues.  Since that time he has had a lot of persistent joint pains most severe in his hands and wrist but also with pain affecting his hips and feet.  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  He is also become disabled from work due to physical limitations especially with his hands.  In addition to the pain swelling and stiffness he also describes tingling and numbness extending into the hands.  He cannot perform push-ups or other activities requiring wrist extension.  He is also unable to tightly grip items with either hand.   Labs reviewed 09/2020 ANA 1:1280 speckled RNP >8.0 SSA 6.6 dsDNA, SM, SSB neg RF neg CCP neg HLA-B27 neg HAV/HBV/HCV neg   Review of Systems  Constitutional:  Negative for fatigue.  HENT:  Negative for mouth sores and mouth dryness.  Eyes:  Negative for dryness.  Respiratory:  Negative for shortness of breath.   Cardiovascular:  Negative for chest pain and palpitations.  Gastrointestinal:  Negative for blood in stool, constipation and diarrhea.  Endocrine: Negative for increased urination.  Genitourinary:  Negative for involuntary urination.  Musculoskeletal:  Positive for joint pain, joint pain, joint swelling and morning stiffness. Negative for gait problem, myalgias, muscle weakness, muscle tenderness and myalgias.  Skin:  Negative for color change, rash, hair loss and sensitivity to sunlight.  Allergic/Immunologic: Negative for susceptible to infections.  Neurological:  Negative for dizziness and headaches.  Hematological:  Negative for swollen glands.  Psychiatric/Behavioral:  Positive for sleep disturbance. Negative for depressed mood. The patient is nervous/anxious.     PMFS History:  Patient Active Problem List   Diagnosis Date Noted   Unspecified rotator  cuff tear or rupture of right shoulder, not specified as traumatic 10/12/2022   Patellofemoral crepitus 10/12/2022   Seronegative rheumatoid arthritis (HCC) 03/28/2022   High risk medication use 03/28/2022   Educated about COVID-19 virus infection 08/06/2019   Precordial chest pain 06/20/2019   Nonspecific abnormal electrocardiogram (ECG) (EKG) 06/20/2019   Bilateral carpal tunnel syndrome 01/10/2018   Laryngopharyngeal reflux (LPR) 11/04/2017   Persistent hoarseness 11/04/2017    Past Medical History:  Diagnosis Date   Abrasion of finger of right hand 12/28/2011   healing wound right index finger   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 phalanx   GERD (gastroesophageal reflux disease)    Inguinal hernia    Pinched nerve in neck    Rheumatoid arthritis (HCC)     Family History  Problem Relation Age of Onset   Rheum arthritis Mother    Sickle cell anemia Father    Asthma Sister    Healthy Son    Past Surgical History:  Procedure Laterality Date   FINGER SURGERY     INGUINAL HERNIA REPAIR Left 10/07/2015   Procedure: LAPAROSCOPIC LEFT  INGUINAL HERNIA;  Surgeon: Lynda Leos, MD;  Location: [facility];  Service: General;  Laterality: Left;   INSERTION OF MESH Left 10/07/2015   Procedure: INSERTION OF MESH;  Surgeon: Lynda Leos, MD;  Location: [facility];  Service: General;  Laterality: Left;   Social History   Social History Narrative   Designer, 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     Immunization History  Administered Date(s) Administered   Tdap 10/10/2019     Objective: Vital Signs: BP 107/67 (BP Location: Left Arm, Patient Position: Sitting, Cuff Size: Normal)   Pulse 67   Ht 5' 10 (1.778 m)   Wt 195 lb 3.2 oz (88.5 kg)   BMI 28.01 kg/m    Physical Exam Eyes:     Conjunctiva/sclera: Conjunctivae normal.  Cardiovascular:     Rate and Rhythm: Normal rate and regular rhythm.   Pulmonary:     Effort: Pulmonary effort is normal.     Breath sounds: Normal breath sounds.  Musculoskeletal:     Right lower leg: No edema.     Left lower leg: No edema.  Lymphadenopathy:     Cervical: No cervical adenopathy.  Skin:    General: Skin is warm and dry.     Findings: No rash.  Neurological:     Mental Status: He is alert.  Psychiatric:        Mood and Affect: Mood normal.      Musculoskeletal  Exam:  Shoulders full ROM no tenderness or swelling Elbows full ROM no tenderness or swelling Wrists full ROM no tenderness or swelling Fingers swelling present in multiple joints worst PIPs minimal tenderness, right 4th and 5th fingers limited in extension ROM Knees full ROM no tenderness or swelling Ankles full ROM no tenderness or swelling  Investigation: No additional findings.  Imaging: No results found.  Recent Labs: Lab Results  Component Value Date   WBC 5.1 02/13/2024   HGB 14.6 02/13/2024   PLT 190 02/13/2024   NA 139 02/13/2024   K 4.0 02/13/2024   CL 105 02/13/2024   CO2 27 02/13/2024   GLUCOSE 94 02/13/2024   BUN 11 02/13/2024   CREATININE 1.15 02/13/2024   BILITOT 0.5 02/13/2024   ALKPHOS 56 01/25/2022   AST 21 02/13/2024   ALT 15 02/13/2024   PROT 7.6 02/13/2024   ALBUMIN 4.2 01/25/2022   CALCIUM 9.2 02/13/2024   GFRAA >60 10/29/2019    Speciality Comments: No specialty comments available.  Procedures:  No procedures performed Allergies: Methotrexate  derivatives   Assessment / Plan:     Visit Diagnoses: Seronegative rheumatoid arthritis (HCC) - Agree with continued meloxicam or intermittent OTC NSAIDs as needed - Plan: folic acid  (FOLVITE ) 1 MG tablet, leflunomide  (ARAVA ) 20 MG tablet Active joint inflammation in hands with swelling. Clinical findings indicate inflammation despite lack of recent sedimentation rate results. Decision to resume medication based on clinical assessment. - Resume leflunomide  20 mg PO daily and folic acid  1  mg daily  High risk medication use - methotrexate  20 mg subcu weekly and folic acid  1 mg daily. Discussed plan to switch going to leflunomide  20 mg p.o. once daily if labs okay Recent CBC and CMP reviewed labs in normal range for resuming medication. Reviewed risks of leflunomide  including GI intolerance, HTN, leukopenia, hepatotoxicity, or infections.   Elevated eosinophil count Eosinophil count elevated on recent CBC, not correlated with any current allergy symptoms, possibly due to recent ibuprofen  use. - Monitor eosinophil count in future labs if clinically indicated.        Orders: No orders of the defined types were placed in this encounter.  Meds ordered this encounter  Medications   folic acid  (FOLVITE ) 1 MG tablet    Sig: Take 1 tablet (1 mg total) by mouth daily.    Dispense:  90 tablet    Refill:  1   leflunomide  (ARAVA ) 20 MG tablet    Sig: Take 1 tablet (20 mg total) by mouth daily.    Dispense:  30 tablet    Refill:  2     Follow-Up Instructions: Return in about 3 months (around 05/27/2024) for RA on LEF start f/u 3mos.   Lonni LELON Ester, MD  Note - This record has been created using [employer].  Chart creation errors have been sought, but may not always  have been located. Such creation errors do not reflect on  the standard of medical care.

## 2024-02-25 ENCOUNTER — Ambulatory Visit: Attending: Internal Medicine | Admitting: Internal Medicine

## 2024-02-25 ENCOUNTER — Encounter: Payer: Self-pay | Admitting: Internal Medicine

## 2024-02-25 VITALS — BP 107/67 | HR 67 | Ht 70.0 in | Wt 195.2 lb

## 2024-02-25 DIAGNOSIS — M0600 Rheumatoid arthritis without rheumatoid factor, unspecified site: Secondary | ICD-10-CM | POA: Diagnosis present

## 2024-02-25 DIAGNOSIS — Z79899 Other long term (current) drug therapy: Secondary | ICD-10-CM | POA: Insufficient documentation

## 2024-02-25 DIAGNOSIS — M75111 Incomplete rotator cuff tear or rupture of right shoulder, not specified as traumatic: Secondary | ICD-10-CM | POA: Diagnosis present

## 2024-02-25 MED ORDER — FOLIC ACID 1 MG PO TABS: 1.0000 mg | ORAL_TABLET | Freq: Every day | ORAL | 1 refills | Status: AC

## 2024-02-25 MED ORDER — LEFLUNOMIDE 20 MG PO TABS: 20.0000 mg | ORAL_TABLET | Freq: Every day | ORAL | 2 refills | Status: AC

## 2024-05-18 NOTE — Progress Notes (Signed)
 Office Visit Note  Patient: Steve Flores             Date of Birth: [birth year]           MRN: [id number]             PCP: Cleotilde, [place]  E, PA Referring: Cleotilde Sunnie BRAVO, PA Visit Date: 06/01/2024   Subjective:  Medical Management of Chronic Issues (Patient would like to discuss the medication and how his body is doing before starting the Arava . )    Discussed the use of AI scribe software for clinical note transcription with the patient, who gave verbal consent to proceed.  History of Present Illness   Steve Flores is a 38 y.o. male here for follow up for seronegative RA.  He is currently concerned about inflammation and its impact on his health, particularly cardiovascular health. He has experienced previous swelling in his hands and has been off medication for a period, considering restarting treatment. He wants to understand his current inflammation levels and their implications on his overall health.  He has been on methotrexate  in the past, which helped reduce inflammation, but he is currently not on any medication and did not start the leflunomide  which we discussed last time. He is considering lifestyle modifications, such as dietary changes, to manage his condition and reduce inflammation.  He is concerned about the potential impact of rheumatoid arthritis on his cardiovascular health, including the condition of his arteries and heart. He reports symptoms that suggest circulatory issues, such as poor circulation in his fingertips and toes, and has consulted with urologists and orthopedists regarding these concerns.  He is aware of his family history, noting that his father has severe rheumatoid arthritis with significant joint deformities. He has undergone radiographic evaluations in the past, with early signs of joint damage noted. He is interested in monitoring his condition to prevent progression similar to his father's experience.  Reports previous hand swelling.  Concerns about circulation in fingertips and toes.       Previous HPI 02/25/2024 Steve Flores is a 38 y.o. male here for follow up follow up for seronegative RA     He underwent ACL repair surgery in January and has been participating in physical therapy for a couple of months. He experiences stiffness in his knee and other joints, which serves as a reminder to take his rheumatoid arthritis medication. However, he has not resumed his medication post-surgery, preferring to allow his body to reacclimate.   He is curious about his rheumatoid arthritis activity levels, particularly the enzymes or markers in his blood, due to the extended period before restarting his medication. His sedimentation rate had been elevated previously, but it was not checked in the recent labs.   He has not started the prescribed medication, leflunomide , due to the timing of his surgery and subsequent pain medication use. He is concerned about any precautions needed before starting the medication again.   He reports a high eosinophil count in his recent blood test, which he attributes to taking ibuprofen  for back pain around the time of his last visit. No typical allergy or asthma symptoms are present, and he does not frequently use antihistamines.   He experiences swelling in his fingers, which has been intermittent for the past few weeks to a month. His fingers can turn blue or Jauregui and get cold quickly, but he does not report any significant circulation problems. No new rashes or significant changes in his condition are  noted.         Previous HPI 04/19/2023 Steve Flores is a 38 y.o. male here for follow up for seronegative RA on methotrexate  on 20 mg Barron weekly and folic acid  1 mg daily.  Since her last visit he has seen orthopedic surgery regarding partial-thickness right shoulder rotator cuff tear.  Symptoms have continued limiting him with severe pain raising his shoulder.  He had limited response to steroid  injection and limited progress with strengthening rehab exercises.  Now scheduled for arthroscopic surgery upcoming in October.  He continues to have fair amount of side effects with the methotrexate  with fatigue and headaches after taking every medication dose.  He was off the methotrexate  for about a 1 month interruption did notice a difference in his arthritis.  But when he resumed the medication all of the side effects were more severe again like when he for started subsequently stopped it due to symptoms were so bad was causing him stress even anticipating them.   Previous HPI 01/16/2023 Steve Flores is a 38 y.o. male here for follow up for seronegative RA on methotrexate  on 20 mg Linden weekly and folic acid  1 mg daily. Symptoms are doing pretty well overall. Still has pain and stiffness more noticeable after increased use. Without visible swelling. Having less issue with fatigue after methotrexate  doses still present but returns to baseline in between.   Previous HPI 10/12/22 Steve Flores is a 38 y.o. male here for follow up for seronegative RA on methotrexate  20 mg subcu weekly folic acid  1 mg daily.  Overall symptoms are partially improved compared to last visit.  Still having some intolerance to methotrexate  but not as bad.  Had steroid injection in the right shoulder with symptom benefit with Steve Flores about 2 weeks ago.  Still taking meloxicam last refill in January so not on every day.  He is very fatigued today associated with less sleep pain for midterms studying in [employer] school with [employer].  He has been noticing some more clicking or popping sensation affecting his knees while climbing stairs.   Previous HPI 07/06/22 Steve Flores is a 38 y.o. male here for follow up for seronegative RA on MTX 15 mg Draper weekly and folic acid  1 mg daily and probable bilateral carpal tunnel syndrome.  Does feel a benefit in his symptoms the swelling is much better though he still has joint pain  and stiffness pretty frequently.  He has noticed a pattern of more symptom improvement about every other week after his methotrexate  dose but not as much on the other half of doses.  He notices pain in both shoulders.  Feels decreased grip strength in his hands worse on the right side, often have more pain after activities like basketball.  He feels some weakness in his knees and but not really painful and does not see any swelling.   Previous HPI 04/27/22 SEDDRICK FLAX is a 38 y.o. male here for follow up for seronegative RA on MTX 15 mg  weekly and folic acid  1 mg daily. He delayed starting the medication until start of this week due to concern about side effects. He was also discussing adjustment to antidepressant medication and concern about significant fatigue. He did notice this worse but not sure if related to the medication. No GI side effects or injection site rash. On review of the medication had concern about hair loss or fertility side effects.   Previous HPI 03/28/2022 Marinda JINNY  Colmenares is a 38 y.o. male here for evaluation and management of inflammatory arthritis, previously seen at [facility] rheumatology with inflammatory changes in wrists, MCPs, and PIPs. He has not been consistently established with medical insurance limiting original diagnosis and treatment.  He developed the joint inflammation symptoms starting since around 2017.  He saw [facility] rheumatology work-up at that time with positive RNP and SSA antibodies with proximal hand swelling and was recommended to start treatment.  He never actually got onto the medicine which appears to have been subcutaneous methotrexate  due to insurance issues.  Since that time he has had a lot of persistent joint pains most severe in his hands and wrist but also with pain affecting his hips and feet.  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  He is also become disabled from work due to physical limitations especially with his hands.  In addition to the pain swelling and stiffness he also describes tingling and numbness extending into the hands.  He cannot perform push-ups or other activities requiring wrist extension.  He is also unable to tightly grip items with either hand.   Labs reviewed 09/2020 ANA 1:1280 speckled RNP >8.0 SSA 6.6 dsDNA, SM, SSB neg RF neg CCP neg HLA-B27 neg HAV/HBV/HCV neg   Review of Systems  Constitutional:  Negative for fatigue.  HENT:  Negative for mouth sores and mouth dryness.   Eyes:  Negative for dryness.  Respiratory:  Negative for shortness of breath.   Cardiovascular:  Negative for chest pain and palpitations.  Gastrointestinal:  Negative for blood in stool, constipation and diarrhea.  Endocrine: Negative for increased urination.  Genitourinary:  Negative for involuntary urination.  Musculoskeletal:  Positive for joint pain, joint pain and morning stiffness. Negative for gait problem, joint swelling, myalgias, muscle weakness, muscle tenderness and myalgias.  Skin:  Negative for color change, rash, hair loss and sensitivity to sunlight.  Allergic/Immunologic: Negative for susceptible to infections.  Neurological:  Negative for dizziness and headaches.  Hematological:  Negative for swollen glands.  Psychiatric/Behavioral:  Positive for sleep disturbance. Negative for depressed mood. The patient is nervous/anxious.     PMFS History:  Patient Active Problem List   Diagnosis Date Noted   Unspecified rotator cuff tear or rupture of right shoulder, not specified as traumatic 10/12/2022   Patellofemoral crepitus 10/12/2022   Seronegative rheumatoid arthritis (HCC) 03/28/2022   High risk medication use 03/28/2022   Educated about COVID-19 virus infection 08/06/2019   Precordial chest pain 06/20/2019   Nonspecific abnormal electrocardiogram (ECG)  (EKG) 06/20/2019   Bilateral carpal tunnel syndrome 01/10/2018   Laryngopharyngeal reflux (LPR) 11/04/2017   Persistent hoarseness 11/04/2017    Past Medical History:  Diagnosis Date   Abrasion of finger of right hand 12/28/2011   healing wound right index finger   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 phalanx   GERD (gastroesophageal reflux disease)    Inguinal hernia    Pinched nerve in neck    Rheumatoid arthritis (HCC)     Family History  Problem Relation Age of Onset   Rheum arthritis Mother    Sickle cell anemia Father    Asthma Sister    Healthy Son    Past Surgical History:  Procedure Laterality Date   ANTERIOR CRUCIATE LIGAMENT REPAIR Right  05/2023   FINGER SURGERY     INGUINAL HERNIA REPAIR Left 10/07/2015   Procedure: LAPAROSCOPIC LEFT  INGUINAL HERNIA;  Surgeon: Lynda Leos, MD;  Location: [facility];  Service: General;  Laterality: Left;   INSERTION OF MESH Left 10/07/2015   Procedure: INSERTION OF MESH;  Surgeon: Lynda Leos, MD;  Location: [facility];  Service: General;  Laterality: Left;   Social History   Social History Narrative   Designer, 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     Immunization History  Administered Date(s) Administered   Tdap 10/10/2019     Objective: Vital Signs: BP 93/62   Pulse 68   Temp 98 F (36.7 C)   Resp 16   Ht 5' 10 (1.778 m)   Wt 193 lb 3.2 oz (87.6 kg)   BMI 27.72 kg/m    Physical Exam Eyes:     Conjunctiva/sclera: Conjunctivae normal.  Cardiovascular:     Rate and Rhythm: Normal rate and regular rhythm.  Pulmonary:     Effort: Pulmonary effort is normal.     Breath sounds: Normal breath sounds.  Musculoskeletal:     Right lower leg: No edema.     Left lower leg: No edema.  Lymphadenopathy:     Cervical: No cervical adenopathy.  Skin:    General: Skin is warm and dry.     Findings: No rash.  Neurological:     Mental Status: He  is alert.  Psychiatric:        Mood and Affect: Mood normal.      Musculoskeletal Exam:  Shoulders full ROM no tenderness or swelling Elbows full ROM no tenderness or swelling Wrists full ROM no tenderness or swelling Fingers full ROM no tenderness or swelling Knees full ROM no tenderness or swelling Ankles full ROM no tenderness or swelling   Investigation: No additional findings.  Imaging: No results found.  Recent Labs: Lab Results  Component Value Date   WBC 5.1 02/13/2024   HGB 14.6 02/13/2024   PLT 190 02/13/2024   NA 139 02/13/2024   K 4.0 02/13/2024   CL 105 02/13/2024   CO2 27 02/13/2024   GLUCOSE 94 02/13/2024   BUN 11 02/13/2024   CREATININE 1.15 02/13/2024   BILITOT 0.5 02/13/2024   ALKPHOS 56 01/25/2022   AST 21 02/13/2024   ALT 15 02/13/2024   PROT 7.6 02/13/2024   ALBUMIN 4.2 01/25/2022   CALCIUM 9.2 02/13/2024   GFRAA >60 10/29/2019    Speciality Comments: No specialty comments available.  Procedures:  No procedures performed Allergies: Methotrexate  and trimetrexate   Assessment / Plan:     Visit Diagnoses: Seronegative rheumatoid arthritis (HCC) - Plan: Sedimentation rate, C-reactive protein Rheumatoid arthritis without rheumatoid factor, unspecified site Rheumatoid arthritis stable with methotrexate . No significant joint damage or increased cardiovascular risk. Discussed lifestyle modifications and future coronary CT calcium score. If inflammation is very high again despite benign joint exam would push more towards DMARD treatment. - Ordered blood test for inflammation markers. - Continue dietary modifications. - Discussed to consider coronary CT calcium score earlier age (48) for future cardiovascular risk assessment. - Follow-up in 6 months or as needed for symptom flare-up.        Orders: Orders Placed This Encounter  Procedures   Sedimentation rate   C-reactive protein   No orders of the defined types were placed in this  encounter.    Follow-Up Instructions: Return in about 6 months (around 11/29/2024) for RA obs f/u 6mos.  Lonni LELON Ester, MD  Note - This record has been created using [employer].  Chart creation errors have been sought, but may not always  have been located. Such creation errors do not reflect on  the standard of medical care.

## 2024-05-25 ENCOUNTER — Encounter: Payer: Self-pay | Admitting: Radiology

## 2024-06-01 ENCOUNTER — Ambulatory Visit: Attending: Internal Medicine | Admitting: Internal Medicine

## 2024-06-01 ENCOUNTER — Encounter: Payer: Self-pay | Admitting: Internal Medicine

## 2024-06-01 VITALS — BP 93/62 | HR 68 | Temp 98.0°F | Resp 16 | Ht 70.0 in | Wt 193.2 lb

## 2024-06-01 DIAGNOSIS — Z79899 Other long term (current) drug therapy: Secondary | ICD-10-CM | POA: Diagnosis not present

## 2024-06-01 DIAGNOSIS — M0600 Rheumatoid arthritis without rheumatoid factor, unspecified site: Secondary | ICD-10-CM | POA: Insufficient documentation

## 2024-06-02 LAB — C-REACTIVE PROTEIN: CRP: <3 mg/L (ref <8.0)

## 2024-06-02 LAB — SEDIMENTATION RATE: Sed Rate: 9 mm/h (ref 0–15)

## 2024-06-11 NOTE — Progress Notes (Signed)
 [facility] [address] [place] [address] Phone: [phone] Subjective:   Steve Flores, am serving as a scribe for Dr. Arthea Claudene.  I'm seeing this patient by the request  of:  Miller, [place]  E, PA  CC: Back pain follow-up  YEP:Dlagzrupcz  AWS SHERE is a 38 y.o. male coming in with complaint of back pain. Has been seeing a chiropractor for adjustments. Started with bulging disc in neck about 5 years ago. Can have radiating pain tingling numbness down arms. Cramping sporadically.       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 phalanx   GERD (gastroesophageal reflux disease)    Inguinal hernia    Pinched nerve in neck    Rheumatoid arthritis [facility])    Past Surgical History:  Procedure Laterality Date   ANTERIOR CRUCIATE LIGAMENT REPAIR Right 05/2023   FINGER SURGERY     INGUINAL HERNIA REPAIR Left 10/07/2015   Procedure: LAPAROSCOPIC LEFT  INGUINAL HERNIA;  Surgeon: Lynda Leos, MD;  Location: [facility];  Service: General;  Laterality: Left;   INSERTION OF MESH Left 10/07/2015   Procedure: INSERTION OF MESH;  Surgeon: Lynda Leos, MD;  Location: [facility];  Service: General;  Laterality: Left;   Social History   Socioeconomic History   Marital status: Single    Spouse name: Not on file   Number of children: Not on file   Years of education: Not on file   Highest education level: Not on file  Occupational History   Not on file  Tobacco Use   Smoking status: Never    Passive exposure: Past   Smokeless tobacco: Never  Vaping Use   Vaping status: Never Used  Substance and Sexual Activity   Alcohol use: Not Currently    Comment: occasionally   Drug use: No   Sexual activity: Not on file  Other Topics Concern   Not on file   Social History Narrative   Designer, 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     Social Drivers of Corporate Investment Banker Strain: Low Risk  (01/28/2023)   Received from [facility]   Overall Financial Resource Strain (CARDIA)    Difficulty of Paying Living Expenses: Not very hard  Food Insecurity: No Food Insecurity (01/28/2023)   Received from [facility]   Hunger Vital Sign    Within the past 12 months, you worried that your food would run out before you got the money to buy more.: Never true    Within the past 12 months, the food you bought just didn't last and you didn't have money to get more.: Never true  Transportation Needs: No Transportation Needs (01/28/2023)   Received from [facility]    Lack of Transportation (Medical): No    Lack of Transportation (Non-Medical): No  Physical Activity: Not on file  Stress: Not on file  Social Connections: Unknown (01/25/2023)   Received from [facility]   Social Network    Social Network: Not on file   Allergies  Allergen Reactions   Methotrexate  And Trimetrexate Nausea Only and Anxiety    headaches   Family History  Problem Relation Age of Onset   Rheum arthritis Mother  Sickle cell anemia Father    Asthma Sister    Healthy Son        Current Outpatient Medications (Analgesics):    meloxicam  (MOBIC ) 15 MG tablet, Take 1 tablet (15 mg total) by mouth daily.   leflunomide  (ARAVA ) 20 MG tablet, Take 1 tablet (20 mg total) by mouth daily. (Patient not taking: Reported on 06/01/2024)  Current Outpatient Medications (Hematological):    folic acid  (FOLVITE ) 1 MG tablet, Take 1 tablet (1 mg total) by mouth daily. (Patient not taking: Reported on 06/01/2024)  Current Outpatient Medications (Other):    anastrozole (ARIMIDEX) 1 MG tablet, Take by mouth every other day. (Patient not taking: Reported on 06/01/2024)   busPIRone (BUSPAR) 5 MG tablet, Take 5 mg by mouth daily.   gabapentin   (NEURONTIN ) 100 MG capsule, Take 1 capsule (100 mg total) by mouth 3 (three) times daily. (Patient not taking: Reported on 06/01/2024)   hydrOXYzine  (ATARAX /VISTARIL ) 25 MG tablet, Take 1 tablet (25 mg total) by mouth every 6 (six) hours. (Patient not taking: Reported on 06/01/2024)   methocarbamol  (ROBAXIN ) 500 MG tablet, Take 2 tablets (1,000 mg total) by mouth every 6 (six) hours as needed for muscle spasms. (Patient not taking: Reported on 06/01/2024)   pantoprazole  (PROTONIX ) 20 MG tablet, Take 1 tablet (20 mg total) by mouth daily. (Patient not taking: Reported on 06/01/2024)   Vitamin D, Ergocalciferol, (DRISDOL) 1.25 MG (50000 UNIT) CAPS capsule, 1 capsule Orally ONCE A WEEK for 60 days (Patient not taking: Reported on 06/01/2024)   Reviewed prior external information including notes and imaging from  primary care provider As well as notes that were available from care everywhere and other healthcare systems.  Past medical history, social, surgical and family history all reviewed in electronic medical record.  No pertanent information unless stated regarding to the chief complaint.   Review of Systems:  No headache, visual changes, nausea, vomiting, diarrhea, constipation, dizziness, abdominal pain, skin rash, fevers, chills, night sweats, weight loss, swollen lymph nodes, body aches, joint swelling, chest pain, shortness of breath, mood changes. POSITIVE muscle aches  Objective  Blood pressure 110/78, pulse 77, height 5' 10 (1.778 m), weight 193 lb (87.5 kg), SpO2 98%.   General: No apparent distress alert and oriented x3 mood and affect normal, dressed appropriately.  HEENT: Pupils equal, extraocular movements intact  Respiratory: Patient's speak in full sentences and does not appear short of breath  Cardiovascular: No lower extremity edema, non tender, no erythema  Patient does have some swelling in the hands noted. Neck exam does have some mild loss of lordosis noted.  Some  negative Spurling's though noted. Patient's knee seems to be stable on the right side.  Osteopathic findings C2 flexed rotated and side bent right C4 flexed rotated and side bent left C6 flexed rotated and side bent left T3 extended rotated and side bent right inhaled third rib T8 extended rotated and side bent left L3 flexed rotated and side bent right L4 flexed rotated and side bent left Sacrum right on right    Impression and Recommendations:  Seronegative rheumatoid arthritis (HCC) Difficult to assess how much this is playing a role.  Patient has been off of methotrexate  for nearly a year at the moment.  States that it did give him some mild nausea.  We discussed with patient about different treatment options but at the moment we will hold.  Given meloxicam  15 mg to take in 5-day burst as needed.  Discussed icing regimen and home  exercises, discussed which activities to do and which ones to avoid.  Increase activity slowly.  Follow-up again in 6 to 8 weeks given different exercises that I think will be beneficial.  Increase activity slowly.  Follow-up again in 6 to 8 weeks  Bilateral carpal tunnel syndrome Diagnosed in 2019.  Has not done anything for it.  Could potentially be giving some of the radicular symptoms.  Could consider the possibility injection.  Patient wants to hold at this time.  Held on the methotrexate  with patient already having downtrending inflammatory markers.   Decision today to treat with OMT was based on Physical Exam  After verbal consent patient was treated with HVLA, ME, FPR techniques in cervical, thoracic, rib, lumbar and sacral areas, all areas are chronic avoided HVLA on the cervical spine  Patient tolerated the procedure well with improvement in symptoms  Patient given exercises, stretches and lifestyle modifications  See medications in patient instructions if given  Patient will follow up in 4-8 weeks  The above documentation has been reviewed  and is accurate and complete Moksh Loomer M Thera Basden, DO

## 2024-06-16 ENCOUNTER — Encounter: Payer: Self-pay | Admitting: Family Medicine

## 2024-06-16 ENCOUNTER — Ambulatory Visit

## 2024-06-16 ENCOUNTER — Ambulatory Visit: Admitting: Family Medicine

## 2024-06-16 VITALS — BP 110/78 | HR 77 | Ht 70.0 in | Wt 193.0 lb

## 2024-06-16 DIAGNOSIS — M542 Cervicalgia: Secondary | ICD-10-CM | POA: Diagnosis not present

## 2024-06-16 DIAGNOSIS — M9908 Segmental and somatic dysfunction of rib cage: Secondary | ICD-10-CM

## 2024-06-16 DIAGNOSIS — M0600 Rheumatoid arthritis without rheumatoid factor, unspecified site: Secondary | ICD-10-CM

## 2024-06-16 DIAGNOSIS — M9902 Segmental and somatic dysfunction of thoracic region: Secondary | ICD-10-CM

## 2024-06-16 DIAGNOSIS — M9904 Segmental and somatic dysfunction of sacral region: Secondary | ICD-10-CM

## 2024-06-16 DIAGNOSIS — G5603 Carpal tunnel syndrome, bilateral upper limbs: Secondary | ICD-10-CM | POA: Diagnosis not present

## 2024-06-16 DIAGNOSIS — M9901 Segmental and somatic dysfunction of cervical region: Secondary | ICD-10-CM

## 2024-06-16 DIAGNOSIS — M9903 Segmental and somatic dysfunction of lumbar region: Secondary | ICD-10-CM

## 2024-06-16 MED ORDER — MELOXICAM 15 MG PO TABS
15.0000 mg | ORAL_TABLET | Freq: Every day | ORAL | 0 refills | Status: AC
Start: 2024-06-16 — End: ?

## 2024-06-16 NOTE — Assessment & Plan Note (Addendum)
 Difficult to assess how much this is playing a role.  Patient has been off of methotrexate  for nearly a year at the moment.  States that it did give him some mild nausea.  We discussed with patient about different treatment options but at the moment we will hold.  Given meloxicam  15 mg to take in 5-day burst as needed.  Discussed icing regimen and home exercises, discussed which activities to do and which ones to avoid.  Increase activity slowly.  Follow-up again in 6 to 8 weeks given different exercises that I think will be beneficial.  Increase activity slowly.  Follow-up again in 6 to 8 weeks

## 2024-06-16 NOTE — Patient Instructions (Addendum)
 Do prescribed exercises at least 3x a week Meloxicam  take in 5 day burst Continue Vit D See you again in 2-3 months Xray today

## 2024-06-16 NOTE — Assessment & Plan Note (Signed)
 Diagnosed in 2019.  Has not done anything for it.  Could potentially be giving some of the radicular symptoms.  Could consider the possibility injection.  Patient wants to hold at this time.

## 2024-06-26 ENCOUNTER — Ambulatory Visit: Payer: Self-pay | Admitting: Family Medicine

## 2024-08-13 NOTE — Progress Notes (Unsigned)
 " [facility] [address] [place] [address] Phone: [phone] Subjective:   Steve Flores, am serving as a scribe for Dr. Arthea Flores.  I'm seeing this patient by the request  of:  Flores, Steve  E, PA  CC: Back and neck pain follow-up  YEP:Dlagzrupcz  Steve Flores is a 39 y.o. male coming in with complaint of back and neck pain. OMT 06/16/2024. Patient states doing well. Same per usual. No new symptoms.  Patient has been doing relatively well overall.  Starting to do the exercises on a more regular basis.  Not having to do as much computer work recently.  Medications patient has been prescribed: Meloxicam   Taking:         Reviewed prior external information including notes and imaging from previsou exam, outside providers and external EMR if available.   As well as notes that were available from care everywhere and other healthcare systems.  Past medical history, social, surgical and family history all reviewed in electronic medical record.  No pertanent information unless stated regarding to the chief complaint.   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 phalanx   GERD (gastroesophageal reflux disease)    Inguinal hernia    Pinched nerve in neck    Rheumatoid arthritis (HCC)     Allergies[1]   Review of Systems:  No headache, visual changes, nausea, vomiting, diarrhea, constipation, dizziness, abdominal pain, skin rash, fevers, chills, night sweats, weight loss, swollen lymph nodes, body aches, joint swelling, chest pain, shortness of breath, mood changes. POSITIVE muscle aches  Objective  Blood pressure 114/70, pulse 86, height 5' 10 (1.778 m), weight 198 lb (89.8 kg), SpO2 97%.   General: No apparent distress alert and oriented x3  mood and affect normal, dressed appropriately.  HEENT: Pupils equal, extraocular movements intact  Respiratory: Patient's speak in full sentences and does not appear short of breath  Cardiovascular: No lower extremity edema, non tender, no erythema  Gait MSK:  Back does have some mild loss of lordosis.  Neck exam does have some stiffness noted as well.  Nothing severe though at this time.  Osteopathic findings  C2 flexed rotated and side bent right C6 flexed rotated and side bent left T3 extended rotated and side bent right inhaled rib T9 extended rotated and side bent left L2 flexed rotated and side bent right Sacrum right on right    Assessment and Plan:  Scapular dyskinesis Right greater than left.  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  Increase activity slowly.  Discussed icing regimen.  Follow-up again in 6 to 8 weeks.    Nonallopathic problems  Decision today to treat with OMT was based on Physical Exam  After verbal consent patient was treated with HVLA, ME, FPR techniques in cervical, rib, thoracic, lumbar, and sacral  areas  Patient tolerated the procedure well with improvement in symptoms  Patient given exercises, stretches and lifestyle modifications  See medications in patient instructions if given  Patient will follow up in 4-8 weeks     The above documentation has been reviewed and is accurate and complete Steve Langenfeld M Ria Redcay, DO  Note: This dictation was prepared with Dragon dictation along with smaller phrase technology. Any transcriptional errors that result from this process are unintentional.            [1]  Allergies Allergen Reactions   Methotrexate  And Trimetrexate Nausea Only and Anxiety    headaches   "

## 2024-08-14 ENCOUNTER — Ambulatory Visit: Admitting: Family Medicine

## 2024-08-14 ENCOUNTER — Encounter: Payer: Self-pay | Admitting: Family Medicine

## 2024-08-14 VITALS — BP 114/70 | HR 86 | Ht 70.0 in | Wt 198.0 lb

## 2024-08-14 DIAGNOSIS — M9908 Segmental and somatic dysfunction of rib cage: Secondary | ICD-10-CM | POA: Diagnosis not present

## 2024-08-14 DIAGNOSIS — M9901 Segmental and somatic dysfunction of cervical region: Secondary | ICD-10-CM

## 2024-08-14 DIAGNOSIS — M9904 Segmental and somatic dysfunction of sacral region: Secondary | ICD-10-CM | POA: Diagnosis not present

## 2024-08-14 DIAGNOSIS — M9903 Segmental and somatic dysfunction of lumbar region: Secondary | ICD-10-CM | POA: Diagnosis not present

## 2024-08-14 DIAGNOSIS — G2589 Other specified extrapyramidal and movement disorders: Secondary | ICD-10-CM

## 2024-08-14 DIAGNOSIS — M9902 Segmental and somatic dysfunction of thoracic region: Secondary | ICD-10-CM

## 2024-08-14 NOTE — Patient Instructions (Signed)
 Overall doing well I'll try to find some names See you again in 2-3 months

## 2024-08-14 NOTE — Assessment & Plan Note (Signed)
 Right greater than left.  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  Increase activity slowly.  Discussed icing regimen.  Follow-up again in 6 to 8 weeks.

## 2024-08-25 ENCOUNTER — Encounter: Payer: Self-pay | Admitting: Family Medicine

## 2024-11-06 ENCOUNTER — Ambulatory Visit: Admitting: Family Medicine

## 2024-12-21 ENCOUNTER — Ambulatory Visit: Admitting: Internal Medicine
